# Patient Record
Sex: Female | Born: 1955
Health system: Southern US, Community
[De-identification: ages and names within clinical notes are randomized; demographics above are authoritative.]

## PROBLEM LIST (undated history)

## (undated) DIAGNOSIS — M67472 Ganglion, left ankle and foot: Secondary | ICD-10-CM

## (undated) DIAGNOSIS — R1011 Right upper quadrant pain: Secondary | ICD-10-CM

## (undated) DIAGNOSIS — Z9882 Breast implant status: Secondary | ICD-10-CM

## (undated) DIAGNOSIS — N898 Other specified noninflammatory disorders of vagina: Secondary | ICD-10-CM

## (undated) DIAGNOSIS — B379 Candidiasis, unspecified: Secondary | ICD-10-CM

## (undated) DIAGNOSIS — I10 Essential (primary) hypertension: Secondary | ICD-10-CM

## (undated) DIAGNOSIS — E7800 Pure hypercholesterolemia, unspecified: Secondary | ICD-10-CM

## (undated) DIAGNOSIS — F99 Mental disorder, not otherwise specified: Secondary | ICD-10-CM

## (undated) DIAGNOSIS — R319 Hematuria, unspecified: Secondary | ICD-10-CM

## (undated) DIAGNOSIS — G43109 Migraine with aura, not intractable, without status migrainosus: Secondary | ICD-10-CM

## (undated) DIAGNOSIS — K219 Gastro-esophageal reflux disease without esophagitis: Secondary | ICD-10-CM

## (undated) DIAGNOSIS — M810 Age-related osteoporosis without current pathological fracture: Principal | ICD-10-CM

## (undated) DIAGNOSIS — R8789 Other abnormal findings in specimens from female genital organs: Secondary | ICD-10-CM

## (undated) DIAGNOSIS — I471 Supraventricular tachycardia, unspecified: Secondary | ICD-10-CM

## (undated) DIAGNOSIS — M549 Dorsalgia, unspecified: Secondary | ICD-10-CM

## (undated) DIAGNOSIS — N951 Menopausal and female climacteric states: Secondary | ICD-10-CM

## (undated) DIAGNOSIS — R232 Flushing: Secondary | ICD-10-CM

## (undated) DIAGNOSIS — IMO0002 Reserved for concepts with insufficient information to code with codable children: Secondary | ICD-10-CM

## (undated) DIAGNOSIS — M489 Spondylopathy, unspecified: Secondary | ICD-10-CM

## (undated) DIAGNOSIS — N816 Rectocele: Secondary | ICD-10-CM

## (undated) DIAGNOSIS — R87629 Unspecified abnormal cytological findings in specimens from vagina: Secondary | ICD-10-CM

## (undated) DIAGNOSIS — R87619 Unspecified abnormal cytological findings in specimens from cervix uteri: Secondary | ICD-10-CM

## (undated) DIAGNOSIS — F419 Anxiety disorder, unspecified: Secondary | ICD-10-CM

## (undated) HISTORY — DX: Candidiasis, unspecified: B37.9

## (undated) HISTORY — DX: Pure hypercholesterolemia, unspecified: E78.00

## (undated) HISTORY — DX: Unspecified abnormal cytological findings in specimens from cervix uteri: R87.619

## (undated) HISTORY — DX: Migraine with aura, not intractable, without status migrainosus: G43.109

## (undated) HISTORY — DX: Rectocele: N81.6

## (undated) HISTORY — PX: OTHER SURGICAL HISTORY: SHX169

## (undated) HISTORY — DX: Spondylopathy, unspecified: M48.9

## (undated) HISTORY — DX: Breast implant status: Z98.82

## (undated) HISTORY — DX: Dorsalgia, unspecified: M54.9

## (undated) HISTORY — DX: Supraventricular tachycardia: I47.1

## (undated) HISTORY — DX: Other specified noninflammatory disorders of vagina: N89.8

## (undated) HISTORY — DX: Flushing: R23.2

## (undated) HISTORY — PX: BREAST ENHANCEMENT SURGERY: SHX7

## (undated) HISTORY — DX: Ganglion, left ankle and foot: M67.472

## (undated) HISTORY — DX: Hematuria, unspecified: R31.9

## (undated) HISTORY — DX: Reserved for concepts with insufficient information to code with codable children: IMO0002

## (undated) HISTORY — DX: Supraventricular tachycardia, unspecified: I47.10

## (undated) HISTORY — DX: Mental disorder, not otherwise specified: F99

## (undated) HISTORY — DX: Anxiety disorder, unspecified: F41.9

## (undated) HISTORY — DX: Other abnormal findings in specimens from female genital organs: R87.89

## (undated) HISTORY — DX: Right upper quadrant pain: R10.11

## (undated) HISTORY — DX: Age-related osteoporosis without current pathological fracture: M81.0

## (undated) HISTORY — DX: Menopausal and female climacteric states: N95.1

## (undated) HISTORY — DX: Unspecified abnormal cytological findings in specimens from vagina: R87.629

---

## 2001-05-24 ENCOUNTER — Other Ambulatory Visit: Admission: RE | Admit: 2001-05-24 | Discharge: 2001-05-24 | Payer: Self-pay | Admitting: Obstetrics and Gynecology

## 2004-09-19 ENCOUNTER — Encounter: Admission: RE | Admit: 2004-09-19 | Discharge: 2004-09-19 | Payer: Self-pay | Admitting: Obstetrics and Gynecology

## 2005-01-07 ENCOUNTER — Encounter: Admission: RE | Admit: 2005-01-07 | Discharge: 2005-01-07 | Payer: Self-pay | Admitting: Obstetrics and Gynecology

## 2005-01-14 ENCOUNTER — Encounter: Admission: RE | Admit: 2005-01-14 | Discharge: 2005-01-14 | Payer: Self-pay | Admitting: Obstetrics and Gynecology

## 2005-02-04 ENCOUNTER — Encounter: Admission: RE | Admit: 2005-02-04 | Discharge: 2005-02-04 | Payer: Self-pay | Admitting: Obstetrics and Gynecology

## 2005-03-06 ENCOUNTER — Encounter: Admission: RE | Admit: 2005-03-06 | Discharge: 2005-03-06 | Payer: Self-pay | Admitting: Obstetrics and Gynecology

## 2005-03-13 ENCOUNTER — Encounter: Admission: RE | Admit: 2005-03-13 | Discharge: 2005-03-13 | Payer: Self-pay | Admitting: Interventional Radiology

## 2005-04-08 ENCOUNTER — Encounter: Admission: RE | Admit: 2005-04-08 | Discharge: 2005-04-08 | Payer: Self-pay | Admitting: Obstetrics and Gynecology

## 2005-04-24 ENCOUNTER — Encounter: Admission: RE | Admit: 2005-04-24 | Discharge: 2005-04-24 | Payer: Self-pay | Admitting: Obstetrics and Gynecology

## 2005-05-08 ENCOUNTER — Encounter: Admission: RE | Admit: 2005-05-08 | Discharge: 2005-05-08 | Payer: Self-pay | Admitting: Interventional Radiology

## 2005-07-22 ENCOUNTER — Encounter: Admission: RE | Admit: 2005-07-22 | Discharge: 2005-07-22 | Payer: Self-pay | Admitting: Obstetrics and Gynecology

## 2007-09-21 ENCOUNTER — Ambulatory Visit: Payer: Self-pay | Admitting: Cardiology

## 2007-09-25 ENCOUNTER — Ambulatory Visit: Payer: Self-pay | Admitting: Cardiology

## 2007-09-29 ENCOUNTER — Ambulatory Visit: Payer: Self-pay | Admitting: Cardiology

## 2007-10-26 ENCOUNTER — Ambulatory Visit: Payer: Self-pay | Admitting: Cardiology

## 2008-05-10 ENCOUNTER — Other Ambulatory Visit: Admission: RE | Admit: 2008-05-10 | Discharge: 2008-05-10 | Payer: Self-pay | Admitting: Obstetrics and Gynecology

## 2008-10-24 ENCOUNTER — Ambulatory Visit: Payer: Self-pay | Admitting: Cardiology

## 2009-02-22 ENCOUNTER — Encounter (INDEPENDENT_AMBULATORY_CARE_PROVIDER_SITE_OTHER): Payer: Self-pay | Admitting: Orthopedic Surgery

## 2009-02-22 ENCOUNTER — Ambulatory Visit (HOSPITAL_BASED_OUTPATIENT_CLINIC_OR_DEPARTMENT_OTHER): Admission: RE | Admit: 2009-02-22 | Discharge: 2009-02-22 | Payer: Self-pay | Admitting: Orthopedic Surgery

## 2009-05-21 ENCOUNTER — Encounter (INDEPENDENT_AMBULATORY_CARE_PROVIDER_SITE_OTHER): Payer: Self-pay | Admitting: *Deleted

## 2009-05-21 ENCOUNTER — Other Ambulatory Visit: Admission: RE | Admit: 2009-05-21 | Discharge: 2009-05-21 | Payer: Self-pay | Admitting: Obstetrics and Gynecology

## 2009-05-21 LAB — CONVERTED CEMR LAB
ALT: 15 units/L
AST: 17 units/L
Albumin: 4.3 g/dL
Alkaline Phosphatase: 65 units/L
BUN: 14 mg/dL
CO2: 24 meq/L
Calcium: 9.5 mg/dL
Chloride: 103 meq/L
Cholesterol: 202 mg/dL
Creatinine, Ser: 0.86 mg/dL
Glucose, Bld: 88 mg/dL
HDL: 58 mg/dL
Hgb A1c MFr Bld: 5.8 %
LDL Cholesterol: 124 mg/dL
Potassium: 4.7 meq/L
Sodium: 140 meq/L
TSH: 0.604 microintl units/mL
Total Protein: 6.9 g/dL
Triglycerides: 101 mg/dL

## 2009-08-09 ENCOUNTER — Telehealth (INDEPENDENT_AMBULATORY_CARE_PROVIDER_SITE_OTHER): Payer: Self-pay | Admitting: *Deleted

## 2009-08-14 ENCOUNTER — Ambulatory Visit: Payer: Self-pay | Admitting: Cardiology

## 2009-08-14 ENCOUNTER — Encounter (INDEPENDENT_AMBULATORY_CARE_PROVIDER_SITE_OTHER): Payer: Self-pay | Admitting: *Deleted

## 2009-08-14 DIAGNOSIS — R29818 Other symptoms and signs involving the nervous system: Secondary | ICD-10-CM | POA: Insufficient documentation

## 2009-08-15 ENCOUNTER — Encounter: Payer: Self-pay | Admitting: Adult Health

## 2009-08-20 ENCOUNTER — Ambulatory Visit: Payer: Self-pay | Admitting: Cardiology

## 2009-08-20 ENCOUNTER — Ambulatory Visit (HOSPITAL_COMMUNITY): Admission: RE | Admit: 2009-08-20 | Discharge: 2009-08-20 | Payer: Self-pay | Admitting: Cardiology

## 2009-08-20 ENCOUNTER — Encounter: Payer: Self-pay | Admitting: Cardiology

## 2009-09-17 ENCOUNTER — Encounter (INDEPENDENT_AMBULATORY_CARE_PROVIDER_SITE_OTHER): Payer: Self-pay | Admitting: *Deleted

## 2009-09-20 ENCOUNTER — Encounter (INDEPENDENT_AMBULATORY_CARE_PROVIDER_SITE_OTHER): Payer: Self-pay | Admitting: *Deleted

## 2009-09-20 ENCOUNTER — Ambulatory Visit: Payer: Self-pay | Admitting: Cardiology

## 2010-09-20 ENCOUNTER — Encounter: Payer: Self-pay | Admitting: Cardiology

## 2010-09-20 ENCOUNTER — Ambulatory Visit
Admission: RE | Admit: 2010-09-20 | Discharge: 2010-09-20 | Payer: Self-pay | Source: Home / Self Care | Attending: Cardiology | Admitting: Cardiology

## 2010-09-20 LAB — CONVERTED CEMR LAB
AST: 24 units/L (ref 0–37)
Albumin: 4.5 g/dL (ref 3.5–5.2)
Alkaline Phosphatase: 76 units/L (ref 39–117)
BUN: 13 mg/dL (ref 6–23)
Basophils Absolute: 0.1 10*3/uL (ref 0.0–0.1)
Basophils Relative: 1 % (ref 0–1)
CO2: 30 meq/L (ref 19–32)
Calcium: 10 mg/dL (ref 8.4–10.5)
Chloride: 100 meq/L (ref 96–112)
Cholesterol: 169 mg/dL (ref 0–200)
Creatinine, Ser: 0.62 mg/dL (ref 0.40–1.20)
Eosinophils Relative: 2 % (ref 0–5)
Glucose, Bld: 99 mg/dL (ref 70–99)
HDL: 65 mg/dL (ref >39)
Hemoglobin: 14.9 g/dL (ref 12.0–15.0)
LDL Cholesterol: 89 mg/dL (ref 0–99)
Lymphocytes Relative: 48 % — ABNORMAL HIGH (ref 12–46)
Lymphs Abs: 2.2 10*3/uL (ref 0.7–4.0)
MCHC: 33 g/dL (ref 30.0–36.0)
MCV: 98.7 fL (ref 78.0–100.0)
Monocytes Absolute: 0.4 10*3/uL (ref 0.1–1.0)
Monocytes Relative: 8 % (ref 3–12)
Neutro Abs: 1.9 10*3/uL (ref 1.7–7.7)
Neutrophils Relative %: 42 % — ABNORMAL LOW (ref 43–77)
Platelets: 260 10*3/uL (ref 150–400)
Potassium: 3.9 meq/L (ref 3.5–5.3)
RBC: 4.57 M/uL (ref 3.87–5.11)
RDW: 11.9 % (ref 11.5–15.5)
Sodium: 139 meq/L (ref 135–145)
TSH: 1.093 microintl units/mL (ref 0.350–4.500)
Total CHOL/HDL Ratio: 2.6
Triglycerides: 73 mg/dL (ref <150)
VLDL: 15 mg/dL (ref 0–40)
WBC: 4.6 10*3/uL (ref 4.0–10.5)

## 2010-09-22 ENCOUNTER — Encounter: Payer: Self-pay | Admitting: Interventional Radiology

## 2010-09-22 ENCOUNTER — Encounter: Payer: Self-pay | Admitting: Obstetrics and Gynecology

## 2010-09-23 ENCOUNTER — Encounter (INDEPENDENT_AMBULATORY_CARE_PROVIDER_SITE_OTHER): Payer: Self-pay | Admitting: *Deleted

## 2010-10-01 NOTE — Letter (Signed)
Summary: [place] Results Engineer, agricultural at [facility]  [address], [place] [address]   Phone: [phone]  Fax: [phone]      September 20, 2009 MRN: [id number]   [facility] [address] [place], [place]  [address]   Dear Ms. Cowman,  Your test ordered by Selena Batten has been reviewed by your physician (or physician assistant) and was found to be normal or stable. Your physician (or physician assistant) felt no changes were needed at this time.  __x__ Echocardiogram  ____ Cardiac Stress Test  ____ Lab Work  ____ Peripheral vascular study of arms, legs or neck  ____ CT scan or X-ray  ____ Lung or Breathing test  ____ Other:  No change in medical treatment at this time, per Dr. Dietrich Pates.  Thank you, Christian Borgerding Allyne Gee RN    [place] Bing, MD, Lenise Arena.C.Gaylord Shih, MD, F.A.C.C Lewayne Bunting, MD, F.A.C.C Nona Dell, MD, F.A.C.C Charlton Haws, MD, Lenise Arena.C.C

## 2010-10-01 NOTE — Assessment & Plan Note (Signed)
Summary: 1 MTH F/U PER CHECKOUT ON 08/14/09/TG      Allergies Added: NKDA  Visit Type:  Follow-up Primary Provider:  Dr,Hasanji   History of Present Illness: Return visit for this very pleasant 55 year old woman with a history of PSVT and chest discomfort.  Since her last visit, she has done generally well.  Her shoulder discomfort has improved, and she apparently was found to have some degenerative joint disease of the cervical spine.  Nonetheless, she is exercising on a daily basis.  She has occasional brief palpitations, but has not had sustained tachycardia for quite some time.  She maintains a high level of activity with no dyspnea and no exercise-induced chest discomfort.  She has had no lightheadedness and no syncope.  Current Medications (verified): 1)  Fish Oil 300 Mg Caps (Omega-3 Fatty Acids) .... Take 1 Cap Daily 2)  Vitamin B-12 100 Mcg Tabs (Cyanocobalamin) .... Take 1 Tab Daily 3)  Folic Acid 1 Mg Tabs (Folic Acid) .... Take 1 Tab Bids 4)  Tylenol Extra Strength 500 Mg Tabs (Acetaminophen) .... As Needed For Pain  Allergies (verified): No Known Drug Allergies  Past History:  Past Medical History: Paroxysmal SVT-documented by event recorder  Anxiety  Past Surgical History: Augmentation mammoplasty  Family History: Father:alive and well Mother:deceased due to cancer Siblings: One brother and one sister are alive and well.  Social History: Widowed; 3 adult children  Tobacco Use - No.  Alcohol Use - no Regular Exercise - yes Drug Use - no  Review of Systems       see history of present illness  Vital Signs:  Patient profile:   55 year old female Weight:      123 pounds BMI:     19.92 Pulse rate:   72 / minute BP sitting:   134 / 66  (right arm)  Vitals Entered By: Dreama Saa, CNA (September 20, 2009 2:29 PM)  Physical Exam  General:   General-Thin; no acute distress:    Neck-No JVD, no carotid bruits: Lungs-No tachypnea, no rales; no rhonchi;no  wheezes; straight back Cardiovascular-normal PMI; normal S1 and S2: Abdomen-BS normal; soft and non-tender without masses or organomegaly:  Skin-Warm, no significant lesions: Extremities-Nl distal pulses; no edema:    Impression & Recommendations:  Problem # 1:  SUPRAVENTRICULAR TACHYCARDIA, PAROXYSMAL, HX OF (ICD-V12.59) It is uncertain whether there has been a spontaneous remission of her PSVT or whether she continues to have spells without significant symptoms.  In any case, treatment need only be utilized on a symptomatic basis.  She has a prescription for beta blocker she requires it.  Otherwise, I will see her again  in one year.  Problem # 2:  MUSCULOSKELETAL PAIN (ICD-781.99) Stress echocardiogram was negative. Musculoskeletal symptoms are improved.  She can use nonsteroidals and analgesics as needed.  Patient Instructions: 1)  Your physician recommends that you schedule a follow-up appointment in: 1 year 2)  Your physician recommends that you continue on your current medications as directed. Please refer to the Current Medication list given to you today.

## 2010-10-01 NOTE — Miscellaneous (Signed)
Summary: labs cmp,lipid tsh 05/21/2009  Clinical Lists Changes  Observations: Added new observation of CALCIUM: 9.5 mg/dL (60/45/4098 11:91) Added new observation of ALBUMIN: 4.3 g/dL (47/82/9562 13:08) Added new observation of PROTEIN, TOT: 6.9 g/dL (65/78/4696 29:52) Added new observation of SGPT (ALT): 15 units/L (05/21/2009 15:37) Added new observation of SGOT (AST): 17 units/L (05/21/2009 15:37) Added new observation of ALK PHOS: 65 units/L (05/21/2009 15:37) Added new observation of CREATININE: 0.86 mg/dL (84/13/2440 10:27) Added new observation of BUN: 14 mg/dL (25/36/6440 34:74) Added new observation of BG RANDOM: 88 mg/dL (25/95/6387 56:43) Added new observation of CO2 PLSM/SER: 24 meq/L (05/21/2009 15:37) Added new observation of CL SERUM: 103 meq/L (05/21/2009 15:37) Added new observation of K SERUM: 4.7 meq/L (05/21/2009 15:37) Added new observation of NA: 140 meq/L (05/21/2009 15:37) Added new observation of LDL: 124 mg/dL (32/95/1884 16:60) Added new observation of HDL: 58 mg/dL (63/09/6008 93:23) Added new observation of TRIGLYC TOT: 101 mg/dL (55/73/2202 54:27) Added new observation of CHOLESTEROL: 202 mg/dL (02/22/7627 31:51) Added new observation of TSH: 0.604 microintl units/mL (05/21/2009 15:37) Added new observation of HGBA1C: 5.8 % (05/21/2009 15:37)

## 2010-10-03 NOTE — Letter (Signed)
Summary: [place] Results Engineer, agricultural at [facility]  [address] [place] [address]   Phone: [phone]  Fax: [phone]      September 23, 2010 MRN: [id number]   [facility] April Knox [address] [place], [place]  [address]   Dear April Knox,  Your test ordered by Selena Batten has been reviewed by your physician (or physician assistant) and was found to be normal or stable. Your physician (or physician assistant) felt no changes were needed at this time.  ____ Echocardiogram  ____ Cardiac Stress Test  __x__ Lab Work  ____ Peripheral vascular study of arms, legs or neck  ____ CT scan or X-ray  ____ Lung or Breathing test  ____ Other: No change in medical treatment at this time, per Dr. Dietrich Pates.  Enclosed is a copy of your labwork for your records.   Thank you, [name] Allyne Gee RN    [place] Bing, MD, Lenise Arena.C.Gaylord Shih, MD, F.A.C.C Lewayne Bunting, MD, F.A.C.C Nona Dell, MD, F.A.C.C Charlton Haws, MD, Lenise Arena.C.C

## 2010-10-03 NOTE — Assessment & Plan Note (Signed)
Summary: 1 yr f/u per checkout on 09/20/09/tg  Medications Added DAILY-VITAMIN  TABS (MULTIPLE VITAMIN) take 1 tab daily      Allergies Added: NKDA  Visit Type:  Follow-up Primary Provider:  Dr,Hasanji   History of Present Illness: Ms. Zurri Rudden returns to the office as scheduled for continued assessment and treatment of atrial or ventricular ectopy and PSVT.  Since her last visit, she has done quite well, noting only occasional and brief palpitations.  She has had no chest discomfort, no lightheadedness, no syncope and no dyspnea.  Cervical spine symptoms improved with conservative measures.  She has had no new medical issues.  She's been less active due to the need to care for an elderly parent.   Current Medications (verified): 1)  Vitamin B-12 100 Mcg Tabs (Cyanocobalamin) .... Take 1 Tab Daily 2)  Folic Acid 1 Mg Tabs (Folic Acid) .... Take 1 Tab Bids 3)  Tylenol Extra Strength 500 Mg Tabs (Acetaminophen) .... As Needed For Pain 4)  Daily-Vitamin  Tabs (Multiple Vitamin) .... Take 1 Tab Daily  Allergies (verified): No Known Drug Allergies  Comments:  Nurse/Medical Assistant: patient uses [facility] didnt bring meds or list   Past History:  PMH, FH, and Social History reviewed and updated.  Past Medical History: Paroxysmal SVT-documented by event recorder  Anxiety Cervical spine disease-mild  Family History: Father:alive and well; dementia at age 26 Mother:deceased due to cancer-unknown primary metastatic to bone Siblings: One brother and one sister are alive and well.  Review of Systems       See history of present illness.  Vital Signs:  Patient profile:   55 year old female Weight:      119 pounds BMI:     19.28 O2 Sat:      98 % on Room air Pulse rate:   75 / minute BP sitting:   136 / 91  (left arm)  Vitals Entered By: Dreama Saa, CNA (September 20, 2010 11:39 AM)  O2 Flow:  Room air  Physical Exam  General:  Thin; no acute  distress:  Blood pressure-repeat with manual cuff: 130/80 Weight-119 4 pounds less than last year Neck-No JVD, no carotid bruits: Lungs-No tachypnea, no rales; no rhonchi; no wheezes; exaggerated lumbar lordosis Cardiovascular-normal PMI; frequent prematures; normal S1 and S2: Abdomen-BS normal; soft and non-tender without masses or organomegaly:  Skin-Warm, no significant lesions: Extremities-Nl distal pulses; no edema:    Impression & Recommendations:  Problem # 1:  SUPRAVENTRICULAR TACHYCARDIA, PAROXYSMAL, HX OF (ICD-V12.59) Patient has frequent ectopy based upon her exam.  This was noted on event recording performed 2 years ago.  She prefers to avoid pharmacologic therapy if possible.  As long as she does not have sustained tachyarrhythmias, I believe that is a safe and reasonable course. Basic laboratory studies including a TSH, CBC and chemistry profile will be performed as well as a lipid profile.  She is encouraged to increase regular physical exercise and to avoid further weight loss.  I will see this nice woman again in one year.  Other Orders: T-Comprehensive Metabolic Panel [phone]) T-CBC w/Diff [phone]) T-TSH [phone]) T-Lipid Profile [phone])  Patient Instructions: 1)  Your physician recommends that you schedule a follow-up appointment in: 1 YEAR OR FOR INCREASED PALPITATIONS 2)  Your physician recommends that you return for lab work in: TODAY 3)  Your physician discussed the importance of regular exercise and recommended that you start or continue a regular exercise program for good health. 4)  DASH DIET

## 2010-12-09 LAB — POCT HEMOGLOBIN-HEMACUE: Hemoglobin: 15.2 g/dL — ABNORMAL HIGH (ref 12.0–15.0)

## 2011-01-14 NOTE — Op Note (Signed)
NAME:  Knox, April              ACCOUNT NO.:  [id number]   MEDICAL RECORD NO.:  [id number]          PATIENT TYPE:  AMB   LOCATION:  [facility]                          FACILITY:  [facility]   PHYSICIAN:  Cindee Salt, M.D.       DATE OF BIRTH:  [birth year]   DATE OF PROCEDURE:  02/22/2009  DATE OF DISCHARGE:                               OPERATIVE REPORT   PREOPERATIVE DIAGNOSIS:  Mucoid tumor, right thumb.   POSTOPERATIVE DIAGNOSIS:  Mucoid tumor, right thumb.   OPERATION:  Excision of mucoid cyst and debridement of interphalangeal  joint, right thumb.   SURGEON:  Cindee Salt, MD   ASSISTANT:  Carolyne Fiscal, RN   ANESTHESIA:  Forearm-based IV regional with local infiltration and  metacarpal block using Marcaine.   ANESTHESIOLOGIST:  Quita Skye. Krista Blue, MD.   HISTORY:  The patient is a 55 year old female with a history of a large  mass over the IP joint of her right thumb.  X-rays revealed degenerative  changes in the IP joint.  She has elected to undergo surgical excision.  Pre, peri, and postoperative course have been discussed along with risks  and complications.  She is aware that there is no guarantee with  surgery, possibility of infection, recurrence injury to arteries,  nerves, tendons, incomplete relief of symptoms, and dystrophy.  In the  preoperative area, the patient is seen.  The extremity is marked by both  the patient and surgeon.  Antibiotic is given.   PROCEDURE:  The patient was brought to the operating room where a  forearm-based IV regional anesthetic was carried out without difficulty.  She was prepped using ChloraPrep, supine position, right arm free.  A 3-  minute dry time was allowed.  A time-out was taken confirming the  patient and procedure.  She was then draped.  The incision was made  curvilinear over the dorsal aspect of the proximal phalanx IP joint of  her right thumb and carried down through the subcutaneous tissue.  Bleeders were electrocauterized with bipolar.   Dissection was carried  down to the cyst with blunt and sharp dissection and this was dissected  free and sent to pathology.  The joint was opened and large osteophyte  was present over the proximal phalanx.  This was removed with a small  rongeur.  No further lesions were identified.  The wound was copiously  irrigated with saline.  The skin was closed interrupted 5-0 Vicryl  Rapide sutures.  A sterile compressive dressing and splint to the finger  was applied.  The patient tolerated the procedure well and was taken to  the recovery room for observation in satisfactory condition.  Prior to  placement of the dressing, a metacarpal block was given with 0.25%  Marcaine without epinephrine, 5 mL was used.  The patient will be  discharged home to return to the [facility] in 1 week on  Vicodin.          ______________________________  Cindee Salt, M.D.    GK/MEDQ  D:  02/22/2009  T:  02/23/2009  Job:  [id number]

## 2011-01-14 NOTE — Letter (Signed)
September 21, 2007    Xaje Hasanaj  [address]  [place],  [place] [address]   RE:  SENNA, LAPE  MRN:  [id number]  /  DOB:  04-29-1956   PRIMARY CARE PHYSICIAN:  Dr. Despina Hidden   Dear Dr. Olena Leatherwood:   It was my pleasure evaluating Ms. Pernice, one of your patients who was  referred by Dr. Despina Hidden for cardiology consultation.  As you know, this  nice woman carries a diagnosis of mitral valve prolapse from a remote  echocardiogram.  She has had no symptoms nor complications referable to  this.  She also has had cardiac irregularities.  She reports episodes of  tachycardia that resolved spontaneously.  The only symptom is  palpitations.  She finds these mildly aversive.  It was recommended that  she take metoprolol, but she was concerned about the side effects and  has not started that medication.  She was seen in Dr. Forestine Chute office and  noticed then noted to have frequent PVCs prompting this referral.   Ms. Steines enjoys generally excellent health.  She has no known  vascular disease.  She has not been seen by a cardiologist for many  years.  She has not been hospitalized except for childbirth and cosmetic  surgery.  SHE REPORTS AN ALLERGY TO PENICILLIN AND CODEINE.  Her only  current medications include oral contraceptives and doxycycline.   SOCIAL HISTORY:  Unemployed; relatively active lifestyle including  raising three children, who are now adults.  She was widowed a few years  ago and is now experiencing stress regarding the breakup of a long-term  relationship.   FAMILY HISTORY:  Mother died with neoplastic disease.  Father is alive  and well.  Two siblings are alive and well.   REVIEW OF SYSTEMS:  Is generally negative.   PHYSICAL EXAMINATION:  Thin woman appearing younger than her stated age.  Her weight is 117 pounds, blood pressure initially 140/90 subsequently  125/80 in the left arm sitting, heart rate 70 and regular, respirations  18.  NECK:  No jugular venous distention;  normal carotid upstrokes without  bruits.  HEENT:  EOMs full; normal lids and conjunctivae; normal oral mucosa.  ENDOCRINE:  No thyromegaly.  HEMATOPOIETIC:  No adenopathy.  LUNGS:  Clear.  Straight back; mild pectus deformity.  CARDIAC:  Normal first and second heart sounds.  She does have a low  pitched systolic sound, probably a click.  There is no significant  murmur.  PMI is normal.  No heaves nor lifts.  ABDOMEN:  Scaphoid; soft and nontender; no organomegaly.  EXTREMITIES:  No edema; normal distal pulses.  NEUROMUSCULAR:  Symmetric strength and tone; normal cranial nerves.   EKG:  Normal sinus rhythm; left atrial abnormality; T-wave inversion in  lead V2 consistent with persistent juvenile pattern; prominent voltage;  frequent PVCs; delayed R-wave progression; nondiagnostic inferior Q-  waves.   IMPRESSION:  Ms. Mick has PVCs.  She has symptoms consistent with  supraventricular tachycardia, but this is far from proven.  Her physical  findings do support a previous diagnosis of MVP, although this diagnosis  was certainly over used at the time she originally underwent  echocardiography.  We will repeat an echocardiogram.  She will be  provided with event recorder for 3 weeks.  She will refrain from taking  metoprolol for the time being.  I will plan see this nice woman again  after she completes 3 weeks of event recording.  Has no other risk  factors for thromboembolism.  I also asked her to determine who has been  treating her with doxycycline over the long-term and to reevaluate  whether continued use of that medicine on a daily basis is necessary.  I  will plan to see this nice woman again after she completes event  recording.    Sincerely,      Gerrit Friends. Dietrich Pates, MD, [facility]  Electronically Signed    RMR/MedQ  DD: 09/21/2007  DT: 09/21/2007  Job #: [id number]   CC:    Lia Hopping

## 2011-01-14 NOTE — Letter (Signed)
October 26, 2007    April Knox  [address]  [place],  [place] [address]   RE:  April, RACZKOWSKI  MRN:  [id number]  /  DOB:  May 20, 1956   Dear Dr. Olena Leatherwood:   April Knox returns to the office for continued assessment and treatment  of mitral valve prolapse and paroxysmal supraventricular tachycardia.  Both of these entities were verified on the testing that we performed.  We asked her to start metoprolol, but she misunderstood those  directions.  When her monitor continued to show supraventricular  tachycardia, we asked her to add Diltiazem.  At that point, she started  taking metoprolol.  She now comes in on metoprolol 25 mg daily and  Diltiazem 240 mg daily in addition to her usual medications.  Her  symptoms are totally suppressed.   On exam, pleasant, healthy-appearing woman.  The weight is 120 pounds, 3  pounds more than at her last visit.  Blood pressure 120/75, heart rate  70 and regular, respirations 18.  NECK:  No jugular venous distention.  LUNGS:  Clear.  CARDIAC:  Normal first and second heart sounds; systolic click present.  EXTREMITIES:  No edema.   Echocardiogram showed delicate mitral valve leaflets with mild prolapse  and mild regurgitation.  Frequent episodes of nonsustained  supraventricular tachycardia were present on her event recorder with  rates as high as 193 beats per minute.   IMPRESSION:  April Knox has mitral valve prolapse.  With the morphology  of her valve and mild regurgitation, this will almost certainly never be  a significant medical issue for her.  She has nonsustained  supraventricular tachycardia.  Symptoms are suppressed with Diltiazem  and a  very low dose of metoprolol.  We will stop metoprolol to see if she  really needs two drugs.  We discussed radiofrequency ablation, but she  is not inclined to consider that procedure at present.  I will reassess  this nice woman again in 1 year or should she once again become  symptomatic.   Thank so much for allowing her see me.    Sincerely,      Gerrit Friends. Dietrich Pates, MD, [facility]  Electronically Signed    RMR/MedQ  DD: 10/26/2007  DT: 10/26/2007  Job #: [id number]

## 2011-01-14 NOTE — Letter (Signed)
October 24, 2008    Lia Hopping, MD  [address]  [place], [place] [address]   RE:  April Knox, April Knox  MRN:  [id number]  /  DOB:  [birth year]   Dear Dr. Olena Leatherwood:   April Knox returns to the office as scheduled for continued assessment  and treatment of PSVT.  Since her last visit, she discontinued both  metoprolol and diltiazem.  She was fearful of taking those medications  continuously.  Fortunately, she has had little in the way of recurrent  symptoms.  Although when she carried an event recorder, she identified  multiple episodes per week of SVT, in recent months, she has suffered  only a rare spell.  These resolved with rest.  She has had no other  cardiopulmonary problems.  Her only other medical problem in the past  year has been an episode of nausea and abdominal discomfort for which  she was evaluated in the emergency department and from which she  eventually recovered.  She was started on ranitidine 150 mg daily during  that spell and continues to take this medication.  Otherwise, she  consumes a number of nutraceuticals.   PHYSICAL EXAMINATION:  GENERAL:  On exam, well-developed trim woman in  no acute distress.  VITAL SIGNS:  The weight is 122, 2 pounds more than in February of last  year.  Blood pressure 120/70, heart rate 70 and regular, respirations 14  and unlabored.  NECK:  No jugular venous distention; no carotid bruits.  LUNGS:  Straight back; clear lung fields.  CARDIAC:  Normal first and second heart sounds.  ABDOMEN:  Soft and nontender; no bruits.  EXTREMITIES:  No edema.   IMPRESSION:  April Knox is doing well with a spontaneous remission of  paroxysmal supraventricular tachycardia.  In all likelihood, she will  suffer a recurrence at some point.  I  advised her that she can start the medication she already has or call  the office if this occurs.  Otherwise, she is fine off medication.  She  is unlikely to experience any significant adverse  events related to her  arrhythmia.  I will be happy to see this nice woman again at any time I  can assist with her care.    Sincerely,      Gerrit Friends. Dietrich Pates, MD, [facility]  Electronically Signed    RMR/MedQ  DD: 10/24/2008  DT: 10/25/2008  Job #: [id number]

## 2011-06-25 ENCOUNTER — Encounter: Payer: Self-pay | Admitting: Cardiology

## 2011-06-26 ENCOUNTER — Encounter: Payer: Self-pay | Admitting: Cardiology

## 2011-06-26 ENCOUNTER — Ambulatory Visit (INDEPENDENT_AMBULATORY_CARE_PROVIDER_SITE_OTHER): Payer: Medicare Other | Admitting: Cardiology

## 2011-06-26 ENCOUNTER — Ambulatory Visit: Payer: Self-pay | Admitting: Cardiology

## 2011-06-26 VITALS — BP 126/82 | HR 64 | Ht 66.0 in | Wt 121.0 lb

## 2011-06-26 DIAGNOSIS — Z8679 Personal history of other diseases of the circulatory system: Secondary | ICD-10-CM

## 2011-06-26 DIAGNOSIS — I498 Other specified cardiac arrhythmias: Secondary | ICD-10-CM

## 2011-06-26 DIAGNOSIS — R29818 Other symptoms and signs involving the nervous system: Secondary | ICD-10-CM

## 2011-06-26 DIAGNOSIS — F419 Anxiety disorder, unspecified: Secondary | ICD-10-CM

## 2011-06-26 DIAGNOSIS — I471 Supraventricular tachycardia: Secondary | ICD-10-CM

## 2011-06-26 MED ORDER — ALPRAZOLAM ER 0.5 MG PO TB24: ORAL_TABLET | ORAL | Status: DC

## 2011-06-26 NOTE — Assessment & Plan Note (Signed)
Patient's symptoms are consistent with anxiety in the setting of social stress.  Fortunately, her sister is looking for a new place to live.  Until then, I prescribed Xanax 0.25-1 mg up to 3 times a day as needed.  Patient will call for new or increased symptoms or contact her PCP.  I will plan to reevaluate April Knox as previously planned in approximately 2 months.

## 2011-06-26 NOTE — Patient Instructions (Signed)
Your physician recommends that you schedule a follow-up appointment in: 1 month  Your physician has recommended you make the following change in your medication: Xanax 0.5mg  1/2 to 2 tabs at bedtime and twice daily as needed.

## 2011-06-26 NOTE — Progress Notes (Signed)
HPI : April Knox returns to the office for a requested visit for evaluation of anxiety associated with chest and abdominal discomfort.  Patient has recently been living with her sister and her sister's 3 children creating substantial disruption and stress in her life.  She has been sleeping poorly with multiple middle of the night awakenings.  She describes racing thoughts, subjective anxiety and vague discomfort in her chest and abdomen.  Appetite has been stable.  She denies depressive affect.  She maintains a fairly good level of physical activity.  Current Medications:   Vitamin B12 100 mcg per day Folic acid 1 mg twice a day Tylenol as needed Multivitamin    Allergies  Allergen Reactions  . Penicillins       Past medical history, social history, and family history reviewed and updated.  ROS: Denies dyspnea, orthopnea, pedal edema, significant weight gain, palpitations, lightheadedness or syncope.  PHYSICAL EXAM: BP 126/82  Pulse 64  Ht 5\' 6"  (1.676 m)  Wt 54.885 kg (121 lb)  BMI 19.53 kg/m2  SpO2 96%  General-Well developed; no acute distress Psychiatric-Mildly anxious demeanor, which is typical for this nice woman Body habitus-proportionate weight and height Neck-No JVD; no carotid bruits Lungs-clear lung fields; resonant to percussion Cardiovascular-normal PMI; normal S1 and S2; modest basilar systolic murmur Abdomen-normal bowel sounds; soft and non-tender without masses or organomegaly Musculoskeletal-No deformities, no cyanosis or clubbing Neurologic-Normal cranial nerves; symmetric strength and tone Skin-Warm, no significant lesions Extremities-distal pulses intact; no edema  EKG:  Normal sinus rhythm; nondiagnostic inferior Q waves; slightly delayed R-wave progression; otherwise normal.  Comparison with a previous tracing performed 09/21/07, PVCs are no longer present and the R wave progression is somewhat worse.  ASSESSMENT AND PLAN:

## 2011-06-30 ENCOUNTER — Ambulatory Visit: Payer: Self-pay | Admitting: Adult Health

## 2011-07-17 ENCOUNTER — Encounter: Payer: Self-pay | Admitting: Cardiology

## 2011-07-29 ENCOUNTER — Ambulatory Visit (INDEPENDENT_AMBULATORY_CARE_PROVIDER_SITE_OTHER): Payer: Medicare Other | Admitting: Cardiology

## 2011-07-29 ENCOUNTER — Encounter: Payer: Self-pay | Admitting: Cardiology

## 2011-07-29 VITALS — BP 134/78 | HR 59 | Ht 66.0 in | Wt 117.0 lb

## 2011-07-29 DIAGNOSIS — F411 Generalized anxiety disorder: Secondary | ICD-10-CM

## 2011-07-29 DIAGNOSIS — I471 Supraventricular tachycardia, unspecified: Secondary | ICD-10-CM

## 2011-07-29 DIAGNOSIS — F419 Anxiety disorder, unspecified: Secondary | ICD-10-CM

## 2011-07-29 DIAGNOSIS — R21 Rash and other nonspecific skin eruption: Secondary | ICD-10-CM

## 2011-07-29 MED ORDER — TRIAMCINOLONE ACETONIDE 0.1 % EX CREA: TOPICAL_CREAM | Freq: Three times a day (TID) | CUTANEOUS | Status: DC

## 2011-07-29 NOTE — Patient Instructions (Signed)
Your physician recommends that you schedule a follow-up appointment in: 1 year  Your physician has recommended you make the following change in your medication:  Triamcinolone (Kennalog) cream 0.1% to affected area three times a tay for 1 week

## 2011-07-29 NOTE — Assessment & Plan Note (Addendum)
Patient appears to be suffering a normal grief reaction less than one month following the death of her father for whom she provided total care during the past few years.  We discussed her emotions.  She is benefiting from use of Ativan, but does not appear to be taking this frequently.  I advised that she find an activity that will absorb her time and provide her with a sense of a fullfillment.  Current chest discomfort is brief, intermittent, relatively nonthreatening and very atypical.  As such, it is more likely a manifestation of her emotional stress than a significant underlying physical condition.

## 2011-07-29 NOTE — Assessment & Plan Note (Addendum)
Patient has not had symptoms nor other clinical manifestations of supraventricular tachycardia in years.  She does not appear to require pharmacologic therapy.

## 2011-07-29 NOTE — Assessment & Plan Note (Addendum)
Minor papular skin eruption exacerbated by manual manipulation.  Dr. Olena Leatherwood has prescribed oral antibiotics.  I will add a moderately potent steroid cream to use for no more than one week.

## 2011-07-29 NOTE — Progress Notes (Signed)
[name], female   DOB: December 12, 1955, 55 y.o.   MRN: [id number] HPI: April Knox returns to the office as scheduled for continued assessment and treatment of PSVT and anxiety.  She reports the recent death of her father, who has been ill with Alzheimer's for the past 5 years.  She has been quite anxious since his demise and is tearful as she describes his terminal course.  She has had sleep problems, decreased appetite and weight loss.  Her daughter and children continue to live with her, but have plans to move.  She is using benzodiazepines on an as needed basis with improvement.  She's had a papule on the skin below her right nares, which has been persistent, but which she has repeatedly attempted to drain.  Prior to Admission medications   Medication Sig Start Date End Date Taking? Authorizing Provider  ALPRAZolam (XANAX XR) 0.5 MG 24 hr tablet Take 1/2 to 2 tablets at bedtime and twice daily as needed for anxiety. 06/26/11  Yes Gerrit Friends. Cori Henningsen, MD  Multiple Vitamin (MULTIVITAMIN) tablet Take 1 tablet by mouth daily.     Yes Historical Provider, MD  naproxen sodium (ANAPROX) 220 MG tablet Take 220 mg by mouth as needed.     Yes Historical Provider, MD  triamcinolone cream (KENALOG) 0.1 % Apply topically 3 (three) times daily. Use x 1 week 07/29/11 07/28/12  Gerrit Friends. Dietrich Pates, MD    Allergies  Allergen Reactions  . Penicillins       Past medical history, social history, and family history reviewed and updated.  ROS: Denies orthopnea, PND, pedal edema, lightheadedness or syncope.  PHYSICAL EXAM: BP 134/78  Pulse 59  Ht 5\' 6"  (1.676 m)  Wt 53.071 kg (117 lb)  BMI 18.88 kg/m2  SpO2 97%; weight is down 4 pounds since prior visit 2 months ago  General-Well developed; no acute distress Body habitus-thin Neck-No JVD; no carotid bruits Lungs-clear lung fields; resonant to percussion Cardiovascular-normal PMI; normal S1 and S2; S4 present Abdomen-normal bowel sounds;  soft and non-tender without masses or organomegaly Musculoskeletal-No deformities, no cyanosis or clubbing Neurologic-Normal cranial nerves; symmetric strength and tone Skin-Warm, erythematous papules(#2) measuring just a few millimeters involving the skin immediately inferior to the nose. Extremities-distal pulses intact; no edema  EKG: Normal sinus rhythm; delayed R-wave progression; otherwise unremarkable.  Comparison with a previous tracing performed 09/21/2007, PVCs no longer present; left atrial abnormality much less impressive; delayed R-wave progression persists; QRS voltage remains prominent.  ASSESSMENT AND PLAN:  [place] Bing, MD 07/29/2011 3:55 PM

## 2011-09-03 DIAGNOSIS — M542 Cervicalgia: Secondary | ICD-10-CM | POA: Diagnosis not present

## 2011-09-03 DIAGNOSIS — IMO0001 Reserved for inherently not codable concepts without codable children: Secondary | ICD-10-CM | POA: Diagnosis not present

## 2011-09-19 DIAGNOSIS — F411 Generalized anxiety disorder: Secondary | ICD-10-CM | POA: Diagnosis not present

## 2011-09-19 DIAGNOSIS — M549 Dorsalgia, unspecified: Secondary | ICD-10-CM | POA: Diagnosis not present

## 2011-09-19 DIAGNOSIS — M542 Cervicalgia: Secondary | ICD-10-CM | POA: Diagnosis not present

## 2011-09-19 DIAGNOSIS — J209 Acute bronchitis, unspecified: Secondary | ICD-10-CM | POA: Diagnosis not present

## 2011-09-19 DIAGNOSIS — K2900 Acute gastritis without bleeding: Secondary | ICD-10-CM | POA: Diagnosis not present

## 2011-09-19 DIAGNOSIS — K219 Gastro-esophageal reflux disease without esophagitis: Secondary | ICD-10-CM | POA: Diagnosis not present

## 2011-10-02 ENCOUNTER — Telehealth: Payer: Self-pay | Admitting: Cardiology

## 2011-10-02 NOTE — Telephone Encounter (Signed)
PT HAS AN UNEXPLAINABLE FEELING IN HER CHEST AT NIGHT AND WOULD LIKE TO HAVE A ECHO DONE.

## 2011-10-03 NOTE — Telephone Encounter (Signed)
Echocardiogram will not necessarily provide useful information or help [name].  April Knox or I can see her if she would like to schedule an appointment.

## 2011-10-06 ENCOUNTER — Telehealth: Payer: Self-pay | Admitting: *Deleted

## 2011-10-06 NOTE — Telephone Encounter (Signed)
Relayed suggestion from Dr Dietrich Pates.  Pt states that she feels much better and that she feels she is just going through too much stress at this time.  Denies need for follow up appointment at this time. Advised her to contact us if she feels she needs further evaluation and we would be happy to see her.

## 2011-10-20 DIAGNOSIS — M549 Dorsalgia, unspecified: Secondary | ICD-10-CM | POA: Diagnosis not present

## 2011-10-20 DIAGNOSIS — J209 Acute bronchitis, unspecified: Secondary | ICD-10-CM | POA: Diagnosis not present

## 2011-10-20 DIAGNOSIS — Z85828 Personal history of other malignant neoplasm of skin: Secondary | ICD-10-CM | POA: Diagnosis not present

## 2011-10-20 DIAGNOSIS — K219 Gastro-esophageal reflux disease without esophagitis: Secondary | ICD-10-CM | POA: Diagnosis not present

## 2011-10-20 DIAGNOSIS — K2900 Acute gastritis without bleeding: Secondary | ICD-10-CM | POA: Diagnosis not present

## 2011-10-20 DIAGNOSIS — F411 Generalized anxiety disorder: Secondary | ICD-10-CM | POA: Diagnosis not present

## 2011-10-20 DIAGNOSIS — M542 Cervicalgia: Secondary | ICD-10-CM | POA: Diagnosis not present

## 2011-10-20 DIAGNOSIS — D485 Neoplasm of uncertain behavior of skin: Secondary | ICD-10-CM | POA: Diagnosis not present

## 2011-10-30 DIAGNOSIS — Z978 Presence of other specified devices: Secondary | ICD-10-CM | POA: Diagnosis not present

## 2011-10-30 DIAGNOSIS — Z1231 Encounter for screening mammogram for malignant neoplasm of breast: Secondary | ICD-10-CM | POA: Diagnosis not present

## 2011-11-05 DIAGNOSIS — D485 Neoplasm of uncertain behavior of skin: Secondary | ICD-10-CM | POA: Diagnosis not present

## 2011-11-21 DIAGNOSIS — M542 Cervicalgia: Secondary | ICD-10-CM | POA: Diagnosis not present

## 2011-11-21 DIAGNOSIS — J209 Acute bronchitis, unspecified: Secondary | ICD-10-CM | POA: Diagnosis not present

## 2011-11-21 DIAGNOSIS — M549 Dorsalgia, unspecified: Secondary | ICD-10-CM | POA: Diagnosis not present

## 2011-11-21 DIAGNOSIS — K219 Gastro-esophageal reflux disease without esophagitis: Secondary | ICD-10-CM | POA: Diagnosis not present

## 2011-11-21 DIAGNOSIS — F411 Generalized anxiety disorder: Secondary | ICD-10-CM | POA: Diagnosis not present

## 2011-11-21 DIAGNOSIS — K2900 Acute gastritis without bleeding: Secondary | ICD-10-CM | POA: Diagnosis not present

## 2011-11-27 DIAGNOSIS — G43109 Migraine with aura, not intractable, without status migrainosus: Secondary | ICD-10-CM | POA: Diagnosis not present

## 2011-12-29 DIAGNOSIS — H109 Unspecified conjunctivitis: Secondary | ICD-10-CM | POA: Diagnosis not present

## 2012-01-08 DIAGNOSIS — R143 Flatulence: Secondary | ICD-10-CM | POA: Diagnosis not present

## 2012-01-08 DIAGNOSIS — R5381 Other malaise: Secondary | ICD-10-CM | POA: Diagnosis not present

## 2012-01-08 DIAGNOSIS — Z1389 Encounter for screening for other disorder: Secondary | ICD-10-CM | POA: Diagnosis not present

## 2012-01-08 DIAGNOSIS — R635 Abnormal weight gain: Secondary | ICD-10-CM | POA: Diagnosis not present

## 2012-01-08 DIAGNOSIS — R413 Other amnesia: Secondary | ICD-10-CM | POA: Diagnosis not present

## 2012-01-08 DIAGNOSIS — R7989 Other specified abnormal findings of blood chemistry: Secondary | ICD-10-CM | POA: Diagnosis not present

## 2012-01-19 DIAGNOSIS — J029 Acute pharyngitis, unspecified: Secondary | ICD-10-CM | POA: Diagnosis not present

## 2012-01-23 DIAGNOSIS — J309 Allergic rhinitis, unspecified: Secondary | ICD-10-CM | POA: Diagnosis not present

## 2012-01-23 DIAGNOSIS — T7840XA Allergy, unspecified, initial encounter: Secondary | ICD-10-CM | POA: Diagnosis not present

## 2012-01-23 DIAGNOSIS — H579 Unspecified disorder of eye and adnexa: Secondary | ICD-10-CM | POA: Diagnosis not present

## 2012-01-23 DIAGNOSIS — Z79899 Other long term (current) drug therapy: Secondary | ICD-10-CM | POA: Diagnosis not present

## 2012-01-23 DIAGNOSIS — H538 Other visual disturbances: Secondary | ICD-10-CM | POA: Diagnosis not present

## 2012-01-23 DIAGNOSIS — Z77098 Contact with and (suspected) exposure to other hazardous, chiefly nonmedicinal, chemicals: Secondary | ICD-10-CM | POA: Diagnosis not present

## 2012-01-27 DIAGNOSIS — H1045 Other chronic allergic conjunctivitis: Secondary | ICD-10-CM | POA: Diagnosis not present

## 2012-02-05 ENCOUNTER — Other Ambulatory Visit (HOSPITAL_COMMUNITY)
Admission: RE | Admit: 2012-02-05 | Discharge: 2012-02-05 | Disposition: A | Discharge status: Home / Self Care | Payer: Medicare Other | Source: Ambulatory Visit | Attending: Obstetrics and Gynecology | Admitting: Obstetrics and Gynecology

## 2012-02-05 ENCOUNTER — Other Ambulatory Visit: Payer: Self-pay | Admitting: Adult Health

## 2012-02-05 DIAGNOSIS — Z124 Encounter for screening for malignant neoplasm of cervix: Secondary | ICD-10-CM | POA: Diagnosis not present

## 2012-02-05 DIAGNOSIS — Z1212 Encounter for screening for malignant neoplasm of rectum: Secondary | ICD-10-CM | POA: Diagnosis not present

## 2012-02-05 DIAGNOSIS — R87810 Cervical high risk human papillomavirus (HPV) DNA test positive: Secondary | ICD-10-CM | POA: Insufficient documentation

## 2012-02-05 DIAGNOSIS — Z1389 Encounter for screening for other disorder: Secondary | ICD-10-CM | POA: Diagnosis not present

## 2012-03-03 DIAGNOSIS — N870 Mild cervical dysplasia: Secondary | ICD-10-CM | POA: Diagnosis not present

## 2012-03-08 ENCOUNTER — Encounter (INDEPENDENT_AMBULATORY_CARE_PROVIDER_SITE_OTHER): Payer: Self-pay | Admitting: Internal Medicine

## 2012-03-08 ENCOUNTER — Ambulatory Visit (INDEPENDENT_AMBULATORY_CARE_PROVIDER_SITE_OTHER): Payer: Medicare Other | Admitting: Internal Medicine

## 2012-03-08 VITALS — BP 130/70 | Temp 98.2°F | Ht 67.0 in | Wt 125.6 lb

## 2012-03-08 DIAGNOSIS — R143 Flatulence: Secondary | ICD-10-CM | POA: Diagnosis not present

## 2012-03-08 DIAGNOSIS — R140 Abdominal distension (gaseous): Secondary | ICD-10-CM | POA: Insufficient documentation

## 2012-03-08 DIAGNOSIS — R142 Eructation: Secondary | ICD-10-CM | POA: Diagnosis not present

## 2012-03-08 MED ORDER — DICYCLOMINE HCL 10 MG PO CAPS: 10.0000 mg | ORAL_CAPSULE | Freq: Two times a day (BID) | ORAL | Status: DC

## 2012-03-08 NOTE — Progress Notes (Signed)
Subjective:     [name], female   DOB: 1956/02/10, 56 y.o.   MRN: [id number]  HPI  Presents today with a grippy feeling in her epigastric region. She says if she eats a little something, she will feel bloated.  She saw Dr. Olena Leatherwood a couple of weeks ago and was given an Rx for Prilosec.  The Prilosec has helped.  She says she feels like she has gained weight. She has had some constipation. Appetite is good. No weight loss. Epigastric tenderness. BM are grainy at times. No melena or bright red rectal bleeding. Stools are hard at times.  She has a BM every days.  9/27/2010EGD/Colonoscopy: Nine month history of intermittent epigastric pain with early satiety  And fullness.  Negative abdominal US.   No evidence of erosive esophagitis. Few gastric  Polyps at body. Largest one 6-7 mm and biopsied for histology. Non-erosive antral gastritis.  Colonoscopy: Two diverticulum, one at sigmoid, second one at hepatic flexure. A 3mm polyp ablated by cold biopsy from ascending colon. Small external hemorrhoids. Biopsy stomach: Fundic gland polyp.  Ascending colon: Tubular adenoma. Review of Systems Current Outpatient Prescriptions  Medication Sig Dispense Refill  . ALPRAZolam (XANAX XR) 0.5 MG 24 hr tablet Take 1/2 to 2 tablets at bedtime and twice daily as needed for anxiety.  90 tablet  1  . Multiple Vitamin (MULTIVITAMIN) tablet Take 1 tablet by mouth daily.        . naproxen sodium (ANAPROX) 220 MG tablet Take 220 mg by mouth as needed.        . triamcinolone cream (KENALOG) 0.1 % Apply topically 3 (three) times daily. Use x 1 week  15 g  0   Past Medical History  Diagnosis Date  . Chest pain   . Anxiety   . PSVT (paroxysmal supraventricular tachycardia)     Documented by event recorder  . Cervical spine disease     Mild   Past Surgical History  Procedure Date  . Breast enhancement surgery    History   Social History  . Marital Status: Widowed    Spouse Name: N/A    Number of  Children: 3  . Years of Education: N/A   Occupational History  . Unemployed    Social History Main Topics  . Smoking status: Never Smoker   . Smokeless tobacco: Never Used  . Alcohol Use: No  . Drug Use: Not on file  . Sexually Active: Not on file   Other Topics Concern  . Not on file   Social History Narrative  . No narrative on file   Family Status  Relation Status Death Age  . Father Deceased     Dementia  . Mother Deceased     unknown primary neoplasm metastatic to bone  . Brother Alive   . Sister Alive    Allergies  Allergen Reactions  . Penicillins         Objective:   Physical Exam  Filed Vitals:   03/08/12 0939  Height: 5\' 7"  (1.702 m)  Weight: 125 lb 9.6 oz (56.972 kg)  Alert and oriented. Skin warm and dry. Oral mucosa is moist.   . Sclera anicteric, conjunctivae is pink. Thyroid not enlarged. No cervical lymphadenopathy. Lungs clear. Heart regular rate and rhythm.  Abdomen is soft. Bowel sounds are positive. No hepatomegaly. No abdominal masses felt. No tenderness.  No edema to lower extremities.         Assessment:   Bloating.  Feeling of fullness. Symptoms better since starting Prilosec. Has been upset ever since receiving abnormal pap smear.    Plan:    Continue Prilosec. Dicyclomine 10mg  BID. PR in 2 weeks.

## 2012-03-08 NOTE — Patient Instructions (Addendum)
Dicyclomine 10mg  BID. Continue Prilosec. PR in 2 weeks

## 2012-03-25 ENCOUNTER — Encounter (INDEPENDENT_AMBULATORY_CARE_PROVIDER_SITE_OTHER): Payer: Self-pay

## 2012-03-25 DIAGNOSIS — K589 Irritable bowel syndrome without diarrhea: Secondary | ICD-10-CM | POA: Diagnosis not present

## 2012-04-13 ENCOUNTER — Encounter (INDEPENDENT_AMBULATORY_CARE_PROVIDER_SITE_OTHER): Payer: Self-pay | Admitting: Internal Medicine

## 2012-04-13 ENCOUNTER — Ambulatory Visit (INDEPENDENT_AMBULATORY_CARE_PROVIDER_SITE_OTHER): Payer: Medicare Other | Admitting: Internal Medicine

## 2012-04-13 VITALS — BP 106/66 | HR 64 | Temp 98.4°F | Ht 66.0 in | Wt 120.4 lb

## 2012-04-13 DIAGNOSIS — R350 Frequency of micturition: Secondary | ICD-10-CM | POA: Diagnosis not present

## 2012-04-13 DIAGNOSIS — R1013 Epigastric pain: Secondary | ICD-10-CM | POA: Diagnosis not present

## 2012-04-13 DIAGNOSIS — R52 Pain, unspecified: Secondary | ICD-10-CM

## 2012-04-13 DIAGNOSIS — R109 Unspecified abdominal pain: Secondary | ICD-10-CM

## 2012-04-13 LAB — HEPATIC FUNCTION PANEL
ALT: 14 U/L (ref 0–35)
Albumin: 4.7 g/dL (ref 3.5–5.2)
Alkaline Phosphatase: 70 U/L (ref 39–117)
Indirect Bilirubin: 0.6 mg/dL (ref 0.0–0.9)
Total Protein: 7.3 g/dL (ref 6.0–8.3)

## 2012-04-13 LAB — CBC WITH DIFFERENTIAL/PLATELET
Basophils Absolute: 0 10*3/uL (ref 0.0–0.1)
Eosinophils Absolute: 0.1 10*3/uL (ref 0.0–0.7)
Eosinophils Relative: 3 % (ref 0–5)
HCT: 41.8 % (ref 36.0–46.0)
Lymphs Abs: 1.9 10*3/uL (ref 0.7–4.0)
MCH: 32.2 pg (ref 26.0–34.0)
MCV: 92.7 fL (ref 78.0–100.0)
Monocytes Absolute: 0.5 10*3/uL (ref 0.1–1.0)
Platelets: 264 10*3/uL (ref 150–400)
RDW: 12.5 % (ref 11.5–15.5)

## 2012-04-13 NOTE — Progress Notes (Signed)
Subjective:     [name], female   DOB: 04-Aug-1956, 56 y.o.   MRN: [id number]  HPI Presents today for f/u.  She says she is still bloating. She has early satiety. Foods are not lodging in her esophagus. She has abdominal fullness. She also has some constipation which is not new.She actually has a BM every day but her stools are hard at times. She saw Dr.  Olena Leatherwood for a recheck of her allergies and she mentioned that she had some issues with constipation. She was started on Linzess.  . She has lost 5 pounds since her last visit which was intentional.  Her appetite is good. She c/o mid abdominal pain which comes and goes. Pain occurs about 3 times a week. Feels like hunger pains.No pain today. She does c/o headache this am. She is presently being treated for a UTI. She noticed her urine smelled strongly. Placed on Bactrim by PCP.   9/27/2010EGD/Colonoscopy: Nine month history of intermittent epigastric pain with early satiety And fullness. Negative abdominal US. No evidence of erosive esophagitis. Few gastric Polyps at body. Largest one 6-7 mm and biopsied for histology. Non-erosive antral gastritis. Colonoscopy: Two diverticulum, one at sigmoid, second one at hepatic flexure. A 3mm polyp ablated by cold biopsy from ascending colon. Small external hemorrhoids. Biopsy stomach: Fundic gland polyp. Ascending colon: Tubular adenoma.  Review of Systems see hpi Current Outpatient Prescriptions  Medication Sig Dispense Refill  . ALPRAZolam (XANAX XR) 0.5 MG 24 hr tablet Take 1/2 to 2 tablets at bedtime and twice daily as needed for anxiety.  90 tablet  1  . Linaclotide (LINZESS) 145 MCG CAPS Take by mouth daily.      . Multiple Vitamin (MULTIVITAMIN) tablet Take 1 tablet by mouth daily.        Marland Kitchen omeprazole (PRILOSEC) 20 MG capsule Take 20 mg by mouth daily.      Marland Kitchen sulfamethoxazole-trimethoprim (BACTRIM DS) 800-160 MG per tablet Take 1 tablet by mouth 2 (two) times daily.      Marland Kitchen  dicyclomine (BENTYL) 10 MG capsule Take 1 capsule (10 mg total) by mouth 2 (two) times daily before a meal.  60 capsule  2  . naproxen sodium (ANAPROX) 220 MG tablet Take 220 mg by mouth as needed.        . triamcinolone cream (KENALOG) 0.1 % Apply topically 3 (three) times daily. Use x 1 week  15 g  0   Past Medical History  Diagnosis Date  . Chest pain   . Anxiety   . PSVT (paroxysmal supraventricular tachycardia)     Documented by event recorder  . Cervical spine disease     Mild   Past Surgical History  Procedure Date  . Breast enhancement surgery    Family Status  Relation Status Death Age  . Father Deceased     Dementia  . Mother Deceased     unknown primary neoplasm metastatic to bone  . Brother Alive   . Sister Alive    History   Social History  . Marital Status: Widowed    Spouse Name: N/A    Number of Children: 3  . Years of Education: N/A   Occupational History  . Unemployed    Social History Main Topics  . Smoking status: Never Smoker   . Smokeless tobacco: Never Used  . Alcohol Use: No  . Drug Use: Not on file  . Sexually Active: Not on file   Other Topics Concern  .  Not on file   Social History Narrative  . No narrative on file   Allergies  Allergen Reactions  . Penicillins         Objective:   Physical Exam Filed Vitals:   04/13/12 0949  Height: 5\' 6"  (1.676 m)  Weight: 120 lb 6.4 oz (54.613 kg)   Alert and oriented. Skin warm and dry. Oral mucosa is moist.   . Sclera anicteric, conjunctivae is pink. Thyroid not enlarged. No cervical lymphadenopathy. Lungs clear. Heart regular rate and rhythm.  Abdomen is soft. Bowel sounds are positive. No hepatomegaly. No abdominal masses felt. No tenderness.  No edema to lower extremities.       Assessment:    Mid abdominal pain which comes and goes. Possible GERD. UTI: presently under treatment    Plan:    CBC today.  Will recheck a urinalysis on her  and Hepatic function. Continue the Linzess.  Stop the Dicyclomine.

## 2012-04-13 NOTE — Patient Instructions (Addendum)
Will get a CBC, Cmet, and a urine.Further recommendations to follow. Continue the Linzess

## 2012-04-14 ENCOUNTER — Other Ambulatory Visit (INDEPENDENT_AMBULATORY_CARE_PROVIDER_SITE_OTHER): Payer: Self-pay | Admitting: Internal Medicine

## 2012-04-14 DIAGNOSIS — R140 Abdominal distension (gaseous): Secondary | ICD-10-CM

## 2012-04-14 DIAGNOSIS — R1013 Epigastric pain: Secondary | ICD-10-CM

## 2012-04-14 LAB — URINALYSIS
Bilirubin Urine: NEGATIVE
Glucose, UA: NEGATIVE mg/dL
Ketones, ur: NEGATIVE mg/dL
Protein, ur: NEGATIVE mg/dL
Urobilinogen, UA: 0.2 mg/dL (ref 0.0–1.0)

## 2012-04-19 ENCOUNTER — Encounter (HOSPITAL_COMMUNITY)
Admission: RE | Admit: 2012-04-19 | Discharge: 2012-04-19 | Disposition: A | Discharge status: Home / Self Care | Payer: Medicare Other | Source: Ambulatory Visit | Attending: Internal Medicine | Admitting: Internal Medicine

## 2012-04-19 DIAGNOSIS — R1013 Epigastric pain: Secondary | ICD-10-CM | POA: Insufficient documentation

## 2012-04-19 DIAGNOSIS — R140 Abdominal distension (gaseous): Secondary | ICD-10-CM

## 2012-04-19 DIAGNOSIS — R141 Gas pain: Secondary | ICD-10-CM | POA: Insufficient documentation

## 2012-04-22 ENCOUNTER — Encounter (HOSPITAL_COMMUNITY): Payer: Medicare Other

## 2012-04-26 ENCOUNTER — Encounter (HOSPITAL_COMMUNITY): Payer: Self-pay

## 2012-04-26 ENCOUNTER — Encounter (HOSPITAL_COMMUNITY)
Admission: RE | Admit: 2012-04-26 | Discharge: 2012-04-26 | Disposition: A | Discharge status: Home / Self Care | Payer: Medicare Other | Source: Ambulatory Visit | Attending: Internal Medicine | Admitting: Internal Medicine

## 2012-04-26 DIAGNOSIS — R6881 Early satiety: Secondary | ICD-10-CM | POA: Diagnosis not present

## 2012-04-26 DIAGNOSIS — R1013 Epigastric pain: Secondary | ICD-10-CM | POA: Diagnosis not present

## 2012-04-26 DIAGNOSIS — R932 Abnormal findings on diagnostic imaging of liver and biliary tract: Secondary | ICD-10-CM | POA: Diagnosis not present

## 2012-04-26 DIAGNOSIS — R142 Eructation: Secondary | ICD-10-CM | POA: Diagnosis not present

## 2012-04-26 DIAGNOSIS — R143 Flatulence: Secondary | ICD-10-CM | POA: Diagnosis not present

## 2012-04-26 MED ORDER — TECHNETIUM TC 99M MEBROFENIN IV KIT
5.0000 | PACK | Freq: Once | INTRAVENOUS | Status: AC | PRN
  Administered 2012-04-26: 5 via INTRAVENOUS

## 2012-04-28 ENCOUNTER — Telehealth (INDEPENDENT_AMBULATORY_CARE_PROVIDER_SITE_OTHER): Payer: Self-pay | Admitting: Internal Medicine

## 2012-04-28 NOTE — Telephone Encounter (Signed)
Spoke to Amy w/ Dr Dereck Leep, Ms Bram has appt with him 04/29/12 to discuss surgical consult for gallbladder, if patient decides to have surgery they will handle referral since they are PCP and patient has [facility]

## 2012-04-28 NOTE — Telephone Encounter (Signed)
Spoke with patient this am. She would like a referral to Dr. Lovell Sheehan for her abnormal HIDA scan.    April Knox needs a referral to Dr. Lovell Sheehan.

## 2012-05-31 DIAGNOSIS — N952 Postmenopausal atrophic vaginitis: Secondary | ICD-10-CM | POA: Diagnosis not present

## 2012-05-31 DIAGNOSIS — N951 Menopausal and female climacteric states: Secondary | ICD-10-CM | POA: Diagnosis not present

## 2012-06-08 DIAGNOSIS — D485 Neoplasm of uncertain behavior of skin: Secondary | ICD-10-CM | POA: Diagnosis not present

## 2012-06-08 DIAGNOSIS — L570 Actinic keratosis: Secondary | ICD-10-CM | POA: Diagnosis not present

## 2012-06-08 DIAGNOSIS — D2360 Other benign neoplasm of skin of unspecified upper limb, including shoulder: Secondary | ICD-10-CM | POA: Diagnosis not present

## 2012-06-08 DIAGNOSIS — Z85828 Personal history of other malignant neoplasm of skin: Secondary | ICD-10-CM | POA: Diagnosis not present

## 2012-06-25 DIAGNOSIS — J209 Acute bronchitis, unspecified: Secondary | ICD-10-CM | POA: Diagnosis not present

## 2012-06-28 DIAGNOSIS — L723 Sebaceous cyst: Secondary | ICD-10-CM | POA: Diagnosis not present

## 2012-07-13 ENCOUNTER — Ambulatory Visit (INDEPENDENT_AMBULATORY_CARE_PROVIDER_SITE_OTHER): Payer: Medicare Other | Admitting: Internal Medicine

## 2012-07-13 ENCOUNTER — Encounter (INDEPENDENT_AMBULATORY_CARE_PROVIDER_SITE_OTHER): Payer: Self-pay | Admitting: Internal Medicine

## 2012-07-13 VITALS — BP 130/76 | HR 78 | Temp 97.8°F | Resp 20 | Ht 66.0 in | Wt 119.4 lb

## 2012-07-13 DIAGNOSIS — Z87898 Personal history of other specified conditions: Secondary | ICD-10-CM

## 2012-07-13 MED ORDER — DICYCLOMINE HCL 10 MG PO CAPS: 10.0000 mg | ORAL_CAPSULE | Freq: Two times a day (BID) | ORAL | Status: DC | PRN

## 2012-07-13 NOTE — Progress Notes (Signed)
Presenting complaint;  Followup for abdominal pain and bloating.  Subjective:  Patient is 56 year old Caucasian female who was initially evaluated in July 2013 by Ms. Raynelle Bring for epigastric pain and bloating. She had negative upper abdominal ultrasound. She was given dicyclomine. She previously had tried Prilosec but it did not help. She underwent HIDA scan with CCK and an EF was low at 21.8% her symptoms were not reproduced. In the meantime she's changed her eating habits and has noted resolution of bloating and epigastric pain and therefore did not seek surgical opinion. She is not taking dicyclomine she still has prescription. She does not take naproxen very often. At the most she may take a couple of doses in a month  Current Medications: Current Outpatient Prescriptions  Medication Sig Dispense Refill  . ALPRAZolam (XANAX XR) 0.5 MG 24 hr tablet Take 1/2 to 2 tablets at bedtime and twice daily as needed for anxiety.  90 tablet  1  . Multiple Vitamin (MULTIVITAMIN) tablet Take 1 tablet by mouth daily.        . naproxen sodium (ANAPROX) 220 MG tablet Take 220 mg by mouth as needed.        . dicyclomine (BENTYL) 10 MG capsule Take 1 capsule (10 mg total) by mouth 2 (two) times daily before a meal.  60 capsule  2     Objective: Blood pressure 130/76, pulse 78, temperature 97.8 F (36.6 C), temperature source Oral, resp. rate 20, height 5\' 6"  (1.676 m), weight 119 lb 6.4 oz (54.159 kg). Patient does not appear to be in any distress. Conjunctiva is pink. Sclera is nonicteric Oropharyngeal mucosa is normal. No neck masses or thyromegaly noted. Abdomen is flat. Bowel sounds are normal. Abdomen is soft and nontender without organomegaly or masses No LE edema or clubbing noted.  Labs/studies Results: HIDA scan results as above. Study performed on 04/27/2012.  Assessment:  #1. Epigastric pain and bloating has resolved. She has changed her eating habits and trying to eat healthy and  she is not even taking dicyclomine. She had low ejection fraction on HIDA scan. However her symptoms are not reproduced and at this point there is no indication for cholecystitis. If symptoms are recurrent may consider repeating this study. #2. She is up-to-date on screening for CRC . She had colonoscopy in September 2010 with removal of 3 mm adenoma. She could wait another 4 years before her next colonoscopy.   Plan:  Call if abdominal pain recurs. Can use dicyclomine 10 mg twice a day when necessary. Office visit in one year.

## 2012-07-13 NOTE — Patient Instructions (Signed)
Call if abdominal pain recurs. 

## 2012-07-22 ENCOUNTER — Encounter: Payer: Self-pay | Admitting: Cardiology

## 2012-07-22 ENCOUNTER — Ambulatory Visit (INDEPENDENT_AMBULATORY_CARE_PROVIDER_SITE_OTHER): Payer: Medicare Other | Admitting: Cardiology

## 2012-07-22 VITALS — BP 110/78 | HR 62 | Ht 66.0 in | Wt 119.8 lb

## 2012-07-22 DIAGNOSIS — Z0189 Encounter for other specified special examinations: Secondary | ICD-10-CM

## 2012-07-22 DIAGNOSIS — I471 Supraventricular tachycardia, unspecified: Secondary | ICD-10-CM

## 2012-07-22 NOTE — Assessment & Plan Note (Signed)
Patient is asymptomatic and has not required medication for many months.  She is encouraged to call the office should she experience prolonged palpitations or apparent arrhythmia associated with other symptoms.

## 2012-07-22 NOTE — Progress Notes (Signed)
[name], female   DOB: 19-Sep-1955, 56 y.o.   MRN: [id number]  HPI: Scheduled return visit for this nice woman with a history of PSVT.  That seemed to last visit one year ago, she has done generally well.  She was hospitalized briefly on one occasion for a viral syndrome with dehydration.  Otherwise, she has remained perfectly healthy.  She occasionally notes very brief episodes of palpitations.  Prior to Admission medications   Medication Sig Start Date End Date Taking? Authorizing Provider  ALPRAZolam (XANAX XR) 0.5 MG 24 hr tablet Take 1/2 to 2 tablets at bedtime and twice daily as needed for anxiety. 06/26/11  Yes Kathlen Brunswick, MD  Fexofenadine-Pseudoephedrine (ALLEGRA-D PO) Take 1 tablet by mouth daily.   Yes Historical Provider, MD  Multiple Vitamin (MULTIVITAMIN) tablet Take 1 tablet by mouth daily.     Yes Historical Provider, MD   Allergies  Allergen Reactions  . Penicillins      Past medical history, social history, and family history reviewed and updated.  ROS: Denies chest pain, dyspnea, lightheadedness or syncope.  She walks regularly without difficulty.  All other systems reviewed and are negative.  PHYSICAL EXAM: BP 110/78  Pulse 62  Ht 5\' 6"  (1.676 m)  Wt 54.341 kg (119 lb 12.8 oz)  BMI 19.34 kg/m2  SpO2 98%   General-Well developed; no acute distress Body habitus-proportionate weight and height Neck-No JVD; no carotid bruits Lungs-clear lung fields; resonant to percussion Cardiovascular-normal PMI;Split S1 and normal S2 Abdomen-normal bowel sounds; soft and non-tender without masses or organomegaly Musculoskeletal-No deformities, no cyanosis or clubbing Neurologic-Normal cranial nerves; symmetric strength and tone Skin-Warm, no significant lesions Extremities-distal pulses intact; no edema  ASSESSMENT AND PLAN:  [place] Bing, MD 07/22/2012 1:15 PM

## 2012-07-22 NOTE — Progress Notes (Deleted)
Name: April Knox    DOB: 1955/09/17  Age: 56 y.o.  MR#: [id number]       PCP:  Toma Deiters, MD      Insurance: @PAYORNAME @    Labs 8/13 MEDICATION LIST   CC:    Chief Complaint  Patient presents with  . Follow-up    Occassional elevated heart rate    VS BP 110/78  Pulse 62  Ht 5\' 6"  (1.676 m)  Wt 119 lb 12.8 oz (54.341 kg)  BMI 19.34 kg/m2  SpO2 98%  Weights Current Weight  07/22/12 119 lb 12.8 oz (54.341 kg)  07/13/12 119 lb 6.4 oz (54.159 kg)  04/13/12 120 lb 6.4 oz (54.613 kg)    Blood Pressure  BP Readings from Last 3 Encounters:  07/22/12 110/78  07/13/12 130/76  04/13/12 106/66     Admit date:  (Not on file) Last encounter with RMR:  Visit date not found   Allergy Allergies  Allergen Reactions  . Penicillins     Current Outpatient Prescriptions  Medication Sig Dispense Refill  . ALPRAZolam (XANAX XR) 0.5 MG 24 hr tablet Take 1/2 to 2 tablets at bedtime and twice daily as needed for anxiety.  90 tablet  1  . Fexofenadine-Pseudoephedrine (ALLEGRA-D PO) Take 1 tablet by mouth daily.      . Multiple Vitamin (MULTIVITAMIN) tablet Take 1 tablet by mouth daily.          Discontinued Meds:    Medications Discontinued During This Encounter  Medication Reason  . dicyclomine (BENTYL) 10 MG capsule Error  . naproxen sodium (ANAPROX) 220 MG tablet Error    Patient Active Problem List  Diagnosis  . MUSCULOSKELETAL PAIN  . Anxiety  . PSVT (paroxysmal supraventricular tachycardia)  . Skin eruption  . Bloating  . Abdominal pain, acute, epigastric    LABS No visits with results within 3 Month(s) from this visit. Latest known visit with results is:  Office Visit on 04/13/2012  Component Date Value  . Color, Urine 04/13/2012 YELLOW   . APPearance 04/13/2012 CLEAR   . Specific Gravity, Urine 04/13/2012 1.020   . pH 04/13/2012 7.0   . Glucose, UA 04/13/2012 NEG   . Bilirubin Urine 04/13/2012 NEG   . Ketones, ur 04/13/2012 NEG   . Hgb urine  dipstick 04/13/2012 NEG   . Protein, ur 04/13/2012 NEG   . Urobilinogen, UA 04/13/2012 0.2   . Nitrite 04/13/2012 NEG   . Leukocytes, UA 04/13/2012 NEG   . WBC 04/13/2012 4.1   . RBC 04/13/2012 4.51   . Hemoglobin 04/13/2012 14.5   . HCT 04/13/2012 41.8   . MCV 04/13/2012 92.7   . [facility] 04/13/2012 32.2   . MCHC 04/13/2012 34.7   . RDW 04/13/2012 12.5   . Platelets 04/13/2012 264   . Neutrophils Relative 04/13/2012 40*  . Neutro Abs 04/13/2012 1.7   . Lymphocytes Relative 04/13/2012 45   . Lymphs Abs 04/13/2012 1.9   . Monocytes Relative 04/13/2012 11   . Monocytes Absolute 04/13/2012 0.5   . Eosinophils Relative 04/13/2012 3   . Eosinophils Absolute 04/13/2012 0.1   . Basophils Relative 04/13/2012 1   . Basophils Absolute 04/13/2012 0.0   . Smear Review 04/13/2012 Criteria for review not met   . Total Bilirubin 04/13/2012 0.7   . Bilirubin, Direct 04/13/2012 0.1   . Indirect Bilirubin 04/13/2012 0.6   . Alkaline Phosphatase 04/13/2012 70   . AST 04/13/2012 22   . ALT 04/13/2012 14   .  Total Protein 04/13/2012 7.3   . Albumin 04/13/2012 4.7      Results for this Opt Visit:     Results for orders placed in visit on 04/13/12  URINALYSIS      Component Value Range   Color, Urine YELLOW  YELLOW   APPearance CLEAR  CLEAR   Specific Gravity, Urine 1.020  1.005 - 1.030   pH 7.0  5.0 - 8.0   Glucose, UA NEG  NEG mg/dL   Bilirubin Urine NEG  NEG   Ketones, ur NEG  NEG mg/dL   Hgb urine dipstick NEG  NEG   Protein, ur NEG  NEG mg/dL   Urobilinogen, UA 0.2  0.0 - 1.0 mg/dL   Nitrite NEG  NEG   Leukocytes, UA NEG  NEG  CBC WITH DIFFERENTIAL      Component Value Range   WBC 4.1  4.0 - 10.5 K/uL   RBC 4.51  3.87 - 5.11 MIL/uL   Hemoglobin 14.5  12.0 - 15.0 g/dL   HCT 16.1  09.6 - 04.5 %   MCV 92.7  78.0 - 100.0 fL   MCH 32.2  26.0 - 34.0 pg   MCHC 34.7  30.0 - 36.0 g/dL   RDW 40.9  81.1 - 91.4 %   Platelets 264  150 - 400 K/uL   Neutrophils Relative 40 (*) 43 - 77 %    Neutro Abs 1.7  1.7 - 7.7 K/uL   Lymphocytes Relative 45  12 - 46 %   Lymphs Abs 1.9  0.7 - 4.0 K/uL   Monocytes Relative 11  3 - 12 %   Monocytes Absolute 0.5  0.1 - 1.0 K/uL   Eosinophils Relative 3  0 - 5 %   Eosinophils Absolute 0.1  0.0 - 0.7 K/uL   Basophils Relative 1  0 - 1 %   Basophils Absolute 0.0  0.0 - 0.1 K/uL   Smear Review Criteria for review not met    HEPATIC FUNCTION PANEL      Component Value Range   Total Bilirubin 0.7  0.3 - 1.2 mg/dL   Bilirubin, Direct 0.1  0.0 - 0.3 mg/dL   Indirect Bilirubin 0.6  0.0 - 0.9 mg/dL   Alkaline Phosphatase 70  39 - 117 U/L   AST 22  0 - 37 U/L   ALT 14  0 - 35 U/L   Total Protein 7.3  6.0 - 8.3 g/dL   Albumin 4.7  3.5 - 5.2 g/dL    EKG Orders placed in visit on 07/29/11  . EKG 12-LEAD     Prior Assessment and Plan Problem List as of 07/22/2012            Cardiology Problems   PSVT (paroxysmal supraventricular tachycardia)   Last Assessment & Plan Note   07/29/2011 Office Visit Addendum 07/29/2011  4:03 PM by Kathlen Brunswick, MD    Patient has not had symptoms nor other clinical manifestations of supraventricular tachycardia in years.  She does not appear to require pharmacologic therapy.      Other   MUSCULOSKELETAL PAIN   Anxiety   Last Assessment & Plan Note   07/29/2011 Office Visit Addendum 07/30/2011  9:41 PM by Kathlen Brunswick, MD    Patient appears to be suffering a normal grief reaction less than one month following the death of her father for whom she provided total care during the past few years.  We discussed her emotions.  She is benefiting  from use of Ativan, but does not appear to be taking this frequently.  I advised that she find an activity that will absorb her time and provide her with a sense of a fullfillment.  Current chest discomfort is brief, intermittent, relatively nonthreatening and very atypical.  As such, it is more likely a manifestation of her emotional stress than a significant  underlying physical condition.    Skin eruption   Last Assessment & Plan Note   07/29/2011 Office Visit Addendum 07/30/2011  9:41 PM by Kathlen Brunswick, MD    Minor papular skin eruption exacerbated by manual manipulation.  Dr. Olena Leatherwood has prescribed oral antibiotics.  I will add a moderately potent steroid cream to use for no more than one week.    Bloating   Abdominal pain, acute, epigastric       Imaging: No results found.   FRS Calculation: Score not calculated. Missing: Total Cholesterol

## 2012-08-04 ENCOUNTER — Telehealth: Payer: Self-pay | Admitting: Cardiology

## 2012-08-04 NOTE — Telephone Encounter (Signed)
Patient would like a copy of her AVS from the last visit mailed to her.

## 2012-08-04 NOTE — Telephone Encounter (Signed)
Copy sent to patient, per her request

## 2012-08-05 DIAGNOSIS — M549 Dorsalgia, unspecified: Secondary | ICD-10-CM | POA: Diagnosis not present

## 2012-08-06 DIAGNOSIS — N760 Acute vaginitis: Secondary | ICD-10-CM | POA: Diagnosis not present

## 2012-08-31 DIAGNOSIS — H1130 Conjunctival hemorrhage, unspecified eye: Secondary | ICD-10-CM | POA: Diagnosis not present

## 2012-09-13 DIAGNOSIS — N898 Other specified noninflammatory disorders of vagina: Secondary | ICD-10-CM | POA: Diagnosis not present

## 2012-09-15 DIAGNOSIS — Z79899 Other long term (current) drug therapy: Secondary | ICD-10-CM | POA: Diagnosis not present

## 2012-09-15 DIAGNOSIS — M549 Dorsalgia, unspecified: Secondary | ICD-10-CM | POA: Diagnosis not present

## 2012-09-15 DIAGNOSIS — Z5181 Encounter for therapeutic drug level monitoring: Secondary | ICD-10-CM | POA: Diagnosis not present

## 2012-09-23 DIAGNOSIS — M549 Dorsalgia, unspecified: Secondary | ICD-10-CM | POA: Diagnosis not present

## 2012-09-28 ENCOUNTER — Other Ambulatory Visit (HOSPITAL_COMMUNITY)
Admission: RE | Admit: 2012-09-28 | Discharge: 2012-09-28 | Disposition: A | Discharge status: Home / Self Care | Payer: Medicare Other | Source: Ambulatory Visit | Attending: Obstetrics and Gynecology | Admitting: Obstetrics and Gynecology

## 2012-09-28 ENCOUNTER — Other Ambulatory Visit: Payer: Self-pay | Admitting: Adult Health

## 2012-09-28 DIAGNOSIS — R002 Palpitations: Secondary | ICD-10-CM | POA: Diagnosis not present

## 2012-09-28 DIAGNOSIS — R7989 Other specified abnormal findings of blood chemistry: Secondary | ICD-10-CM | POA: Diagnosis not present

## 2012-09-28 DIAGNOSIS — Z1151 Encounter for screening for human papillomavirus (HPV): Secondary | ICD-10-CM | POA: Diagnosis not present

## 2012-09-28 DIAGNOSIS — Z01419 Encounter for gynecological examination (general) (routine) without abnormal findings: Secondary | ICD-10-CM | POA: Diagnosis not present

## 2012-09-28 DIAGNOSIS — Z124 Encounter for screening for malignant neoplasm of cervix: Secondary | ICD-10-CM | POA: Diagnosis not present

## 2012-09-28 DIAGNOSIS — F410 Panic disorder [episodic paroxysmal anxiety] without agoraphobia: Secondary | ICD-10-CM | POA: Diagnosis not present

## 2012-09-28 DIAGNOSIS — Z0142 Encounter for cervical smear to confirm findings of recent normal smear following initial abnormal smear: Secondary | ICD-10-CM | POA: Diagnosis not present

## 2012-10-19 DIAGNOSIS — D485 Neoplasm of uncertain behavior of skin: Secondary | ICD-10-CM | POA: Diagnosis not present

## 2012-10-19 DIAGNOSIS — D1800 Hemangioma unspecified site: Secondary | ICD-10-CM | POA: Diagnosis not present

## 2012-10-19 DIAGNOSIS — L570 Actinic keratosis: Secondary | ICD-10-CM | POA: Diagnosis not present

## 2012-11-01 DIAGNOSIS — Z1231 Encounter for screening mammogram for malignant neoplasm of breast: Secondary | ICD-10-CM | POA: Diagnosis not present

## 2012-11-01 DIAGNOSIS — Z978 Presence of other specified devices: Secondary | ICD-10-CM | POA: Diagnosis not present

## 2012-11-02 DIAGNOSIS — J029 Acute pharyngitis, unspecified: Secondary | ICD-10-CM | POA: Diagnosis not present

## 2012-11-17 ENCOUNTER — Telehealth: Payer: Self-pay | Admitting: Adult Health

## 2012-11-17 DIAGNOSIS — F064 Anxiety disorder due to known physiological condition: Secondary | ICD-10-CM | POA: Diagnosis not present

## 2012-11-17 NOTE — Telephone Encounter (Signed)
Patient has back pain and wanted meds told her to talk with her pcp

## 2012-12-15 DIAGNOSIS — J0100 Acute maxillary sinusitis, unspecified: Secondary | ICD-10-CM | POA: Diagnosis not present

## 2013-01-10 DIAGNOSIS — J209 Acute bronchitis, unspecified: Secondary | ICD-10-CM | POA: Diagnosis not present

## 2013-01-11 DIAGNOSIS — H81399 Other peripheral vertigo, unspecified ear: Secondary | ICD-10-CM | POA: Diagnosis not present

## 2013-01-11 DIAGNOSIS — M542 Cervicalgia: Secondary | ICD-10-CM | POA: Diagnosis not present

## 2013-01-11 DIAGNOSIS — G541 Lumbosacral plexus disorders: Secondary | ICD-10-CM | POA: Diagnosis not present

## 2013-01-11 DIAGNOSIS — S139XXA Sprain of joints and ligaments of unspecified parts of neck, initial encounter: Secondary | ICD-10-CM | POA: Diagnosis not present

## 2013-01-11 DIAGNOSIS — Z79899 Other long term (current) drug therapy: Secondary | ICD-10-CM | POA: Diagnosis not present

## 2013-01-11 DIAGNOSIS — H8190 Unspecified disorder of vestibular function, unspecified ear: Secondary | ICD-10-CM | POA: Diagnosis not present

## 2013-01-14 DIAGNOSIS — H1045 Other chronic allergic conjunctivitis: Secondary | ICD-10-CM | POA: Diagnosis not present

## 2013-01-18 ENCOUNTER — Ambulatory Visit (INDEPENDENT_AMBULATORY_CARE_PROVIDER_SITE_OTHER): Payer: Medicare Other | Admitting: Adult Health

## 2013-01-18 ENCOUNTER — Encounter: Payer: Self-pay | Admitting: Adult Health

## 2013-01-18 ENCOUNTER — Telehealth: Payer: Self-pay | Admitting: *Deleted

## 2013-01-18 VITALS — BP 116/74 | HR 64 | Ht 66.0 in | Wt 122.0 lb

## 2013-01-18 DIAGNOSIS — I471 Supraventricular tachycardia: Secondary | ICD-10-CM

## 2013-01-18 DIAGNOSIS — F411 Generalized anxiety disorder: Secondary | ICD-10-CM

## 2013-01-18 DIAGNOSIS — F419 Anxiety disorder, unspecified: Secondary | ICD-10-CM

## 2013-01-18 NOTE — Progress Notes (Signed)
   HPI: April Knox is a 57 year old patient of Dr. Dietrich Pates we are following for ongoing assessment and management of PSVT. She was last seen in the office in November of 2013 and was without complaint of palpitations. No medications were changed.   She works out 5 days a week, cycling, walking and aerobics with a kettle bell. She has had one episode chest pain, after working out. No complaints of palpitations.   Allergies  Allergen Reactions  . Penicillins     Current Outpatient Prescriptions  Medication Sig Dispense Refill  . cetirizine (ZYRTEC) 10 MG tablet Take 10 mg by mouth daily.      . Multiple Vitamin (MULTIVITAMIN) tablet Take 1 tablet by mouth daily.        Marland Kitchen ALPRAZolam (XANAX XR) 0.5 MG 24 hr tablet Take 1/2 to 2 tablets at bedtime and twice daily as needed for anxiety.  90 tablet  1   No current facility-administered medications for this visit.    Past Medical History  Diagnosis Date  . Chest pain   . Anxiety   . PSVT (paroxysmal supraventricular tachycardia)     Documented by event recorder  . Cervical spine disease     Mild    Past Surgical History  Procedure Laterality Date  . Breast enhancement surgery      ZOX:WRUEAV of systems complete and found to be negative unless listed above PHYSICAL EXAM BP 116/74  Pulse 64  Ht 5\' 6"  (1.676 m)  Wt 122 lb (55.339 kg)  BMI 19.7 kg/m2  General: Well developed, well nourished, in no acute distress Head: Eyes PERRLA, No xanthomas.   Normal cephalic and atramatic  Lungs: Clear bilaterally to auscultation and percussion. Heart: HRRR S1 S2, without MRG.  Pulses are 2+ & equal.            No carotid bruit. No JVD.  No abdominal bruits. No femoral bruits. Abdomen: Bowel sounds are positive, abdomen soft and non-tender without masses or                  Hernia's noted. Msk:  Back normal, normal gait. Normal strength and tone for age. Extremities: No clubbing, cyanosis or edema.  DP +1 Neuro: Alert and oriented X  3. Psych:  Good affect, responds appropriately  EKG: NSR   ASSESSMENT AND PLAN

## 2013-01-18 NOTE — Assessment & Plan Note (Signed)
Occasional occurences. Will defer to PCP

## 2013-01-18 NOTE — Telephone Encounter (Signed)
PT WANTS TO KNOW IF SHE CAN TAKE SINGULAR AND ZYRTECK TOGETHER ALL THE TIME. PCP HAS HER ON THIS.

## 2013-01-18 NOTE — Assessment & Plan Note (Signed)
No further complaints of PSVT. Chest pain most likely musculoskeletal. No changes in medication regimen. EKG unchanged. Will see in one year unless symptomatic.

## 2013-01-18 NOTE — Patient Instructions (Addendum)
The current medical regimen is effective;  continue present plan and medications.  Follow up in 1 year with Joni Reining, NP.  You will receive a letter in the mail 2 months before you are due.  Please call us when you receive this letter to schedule your follow up appointment.

## 2013-01-21 DIAGNOSIS — J301 Allergic rhinitis due to pollen: Secondary | ICD-10-CM | POA: Diagnosis not present

## 2013-01-31 ENCOUNTER — Telehealth: Payer: Self-pay | Admitting: Adult Health

## 2013-01-31 DIAGNOSIS — F064 Anxiety disorder due to known physiological condition: Secondary | ICD-10-CM | POA: Diagnosis not present

## 2013-01-31 MED ORDER — ALPRAZOLAM ER 0.5 MG PO TB24: ORAL_TABLET | ORAL | Status: DC

## 2013-01-31 NOTE — Telephone Encounter (Signed)
feeling anxious wants refill on xanax will call in 1 to [facility] in [place]

## 2013-02-09 DIAGNOSIS — M542 Cervicalgia: Secondary | ICD-10-CM | POA: Diagnosis not present

## 2013-02-09 DIAGNOSIS — H8190 Unspecified disorder of vestibular function, unspecified ear: Secondary | ICD-10-CM | POA: Diagnosis not present

## 2013-02-09 DIAGNOSIS — G541 Lumbosacral plexus disorders: Secondary | ICD-10-CM | POA: Diagnosis not present

## 2013-02-09 DIAGNOSIS — Z79899 Other long term (current) drug therapy: Secondary | ICD-10-CM | POA: Diagnosis not present

## 2013-02-09 DIAGNOSIS — H81399 Other peripheral vertigo, unspecified ear: Secondary | ICD-10-CM | POA: Diagnosis not present

## 2013-02-14 DIAGNOSIS — L906 Striae atrophicae: Secondary | ICD-10-CM | POA: Diagnosis not present

## 2013-02-14 DIAGNOSIS — L570 Actinic keratosis: Secondary | ICD-10-CM | POA: Diagnosis not present

## 2013-02-21 DIAGNOSIS — F4321 Adjustment disorder with depressed mood: Secondary | ICD-10-CM | POA: Diagnosis not present

## 2013-02-21 DIAGNOSIS — F411 Generalized anxiety disorder: Secondary | ICD-10-CM | POA: Diagnosis not present

## 2013-02-24 DIAGNOSIS — M5030 Other cervical disc degeneration, unspecified cervical region: Secondary | ICD-10-CM | POA: Diagnosis not present

## 2013-02-24 DIAGNOSIS — IMO0001 Reserved for inherently not codable concepts without codable children: Secondary | ICD-10-CM | POA: Diagnosis not present

## 2013-03-01 DIAGNOSIS — M5030 Other cervical disc degeneration, unspecified cervical region: Secondary | ICD-10-CM | POA: Diagnosis not present

## 2013-03-01 DIAGNOSIS — IMO0001 Reserved for inherently not codable concepts without codable children: Secondary | ICD-10-CM | POA: Diagnosis not present

## 2013-03-03 ENCOUNTER — Encounter: Payer: Self-pay | Admitting: Adult Health

## 2013-03-03 ENCOUNTER — Ambulatory Visit (INDEPENDENT_AMBULATORY_CARE_PROVIDER_SITE_OTHER): Payer: Medicare Other | Admitting: Adult Health

## 2013-03-03 ENCOUNTER — Other Ambulatory Visit (HOSPITAL_COMMUNITY)
Admission: RE | Admit: 2013-03-03 | Discharge: 2013-03-03 | Disposition: A | Discharge status: Home / Self Care | Payer: Medicare Other | Source: Ambulatory Visit | Attending: Adult Health | Admitting: Adult Health

## 2013-03-03 VITALS — BP 120/72 | HR 70 | Ht 66.0 in | Wt 126.0 lb

## 2013-03-03 DIAGNOSIS — Z0000 Encounter for general adult medical examination without abnormal findings: Secondary | ICD-10-CM | POA: Diagnosis not present

## 2013-03-03 DIAGNOSIS — Z01419 Encounter for gynecological examination (general) (routine) without abnormal findings: Secondary | ICD-10-CM | POA: Diagnosis not present

## 2013-03-03 DIAGNOSIS — Z9889 Other specified postprocedural states: Secondary | ICD-10-CM | POA: Insufficient documentation

## 2013-03-03 DIAGNOSIS — N816 Rectocele: Secondary | ICD-10-CM

## 2013-03-03 DIAGNOSIS — Z1212 Encounter for screening for malignant neoplasm of rectum: Secondary | ICD-10-CM | POA: Diagnosis not present

## 2013-03-03 DIAGNOSIS — Z124 Encounter for screening for malignant neoplasm of cervix: Secondary | ICD-10-CM | POA: Diagnosis not present

## 2013-03-03 DIAGNOSIS — Z1151 Encounter for screening for human papillomavirus (HPV): Secondary | ICD-10-CM | POA: Insufficient documentation

## 2013-03-03 DIAGNOSIS — Z87898 Personal history of other specified conditions: Secondary | ICD-10-CM

## 2013-03-03 DIAGNOSIS — Z9882 Breast implant status: Secondary | ICD-10-CM

## 2013-03-03 HISTORY — DX: Breast implant status: Z98.82

## 2013-03-03 HISTORY — DX: Rectocele: N81.6

## 2013-03-03 LAB — HEMOCCULT GUIAC POC 1CARD (OFFICE): Fecal Occult Blood, POC: NEGATIVE

## 2013-03-03 NOTE — Progress Notes (Signed)
[name], female   DOB: 1955-10-13, 57 y.o.   MRN: [id number] History of Present Illness: April Knox is a 57 year old white female widowed, in for a pap and physical.   Current Medications, Allergies, Past Medical History, Past Surgical History, Family History and Social History were reviewed in [facility] electronic medical record.     Review of Systems: Ptient denies any headaches, blurred vision, shortness of breath, chest pain, abdominal pain, problems with bowel movements, urination, or intercourse. No joint pain or mood changes, she some occasional constipation and bloating and vaginal dryness.Uses Luvena at times, discussed could use olive oil.    Physical Exam:BP 120/72  Pulse 70  Ht 5\' 6"  (1.676 m)  Wt 126 lb (57.153 kg)  BMI 20.35 kg/m2 General:  Well developed, well nourished, no acute distress Skin:  Warm and dry, tan Neck:  Midline trachea, normal thyroid Lungs; Clear to auscultation bilaterally Breast:  No dominant palpable mass, retraction, or nipple discharge, she has bilateral implants and a normal mammogram in March. Cardiovascular: Regular rate and rhythm Abdomen:  Soft, non tender, no hepatosplenomegaly Pelvic:  External genitalia is normal in appearance.  The vagina is normal in appearance.  The cervix is bulbous. Pap performed with HPV. No  adnexal masses or tenderness noted.Uterus NSSC, non tender, Rectal: Good sphincter tone, no polyps, or hemorrhoids felt.  Hemoccult negative.+ rectocele Extremities:  No swelling or varicosities noted Psych:  Alert and cooperative and seems happy   Impression: Yearly gyn exam  Rectocele History of abnormal pap Constipation   Plan: Physical in 1 year Mammogram yearly Review handout on rectocele Colonoscopy in 2020 Eat prunes daily

## 2013-03-03 NOTE — Patient Instructions (Addendum)
Follow up in 1 year for physical Mammogram yearly Colonoscopy in 2020

## 2013-03-07 ENCOUNTER — Telehealth: Payer: Self-pay | Admitting: Adult Health

## 2013-03-07 DIAGNOSIS — IMO0001 Reserved for inherently not codable concepts without codable children: Secondary | ICD-10-CM | POA: Diagnosis not present

## 2013-03-07 DIAGNOSIS — M5030 Other cervical disc degeneration, unspecified cervical region: Secondary | ICD-10-CM | POA: Diagnosis not present

## 2013-03-07 NOTE — Telephone Encounter (Signed)
Left message pap normal  

## 2013-03-10 DIAGNOSIS — H8190 Unspecified disorder of vestibular function, unspecified ear: Secondary | ICD-10-CM | POA: Diagnosis not present

## 2013-03-10 DIAGNOSIS — G541 Lumbosacral plexus disorders: Secondary | ICD-10-CM | POA: Diagnosis not present

## 2013-03-10 DIAGNOSIS — Z79899 Other long term (current) drug therapy: Secondary | ICD-10-CM | POA: Diagnosis not present

## 2013-03-10 DIAGNOSIS — H81399 Other peripheral vertigo, unspecified ear: Secondary | ICD-10-CM | POA: Diagnosis not present

## 2013-03-10 DIAGNOSIS — M542 Cervicalgia: Secondary | ICD-10-CM | POA: Diagnosis not present

## 2013-03-11 DIAGNOSIS — Z79899 Other long term (current) drug therapy: Secondary | ICD-10-CM | POA: Diagnosis not present

## 2013-03-11 DIAGNOSIS — M545 Low back pain: Secondary | ICD-10-CM | POA: Diagnosis not present

## 2013-03-14 DIAGNOSIS — IMO0001 Reserved for inherently not codable concepts without codable children: Secondary | ICD-10-CM | POA: Diagnosis not present

## 2013-03-14 DIAGNOSIS — M5030 Other cervical disc degeneration, unspecified cervical region: Secondary | ICD-10-CM | POA: Diagnosis not present

## 2013-03-17 DIAGNOSIS — G43109 Migraine with aura, not intractable, without status migrainosus: Secondary | ICD-10-CM | POA: Diagnosis not present

## 2013-03-22 DIAGNOSIS — IMO0001 Reserved for inherently not codable concepts without codable children: Secondary | ICD-10-CM | POA: Diagnosis not present

## 2013-03-22 DIAGNOSIS — M5030 Other cervical disc degeneration, unspecified cervical region: Secondary | ICD-10-CM | POA: Diagnosis not present

## 2013-03-28 DIAGNOSIS — L570 Actinic keratosis: Secondary | ICD-10-CM | POA: Diagnosis not present

## 2013-03-28 DIAGNOSIS — K2900 Acute gastritis without bleeding: Secondary | ICD-10-CM | POA: Diagnosis not present

## 2013-04-05 DIAGNOSIS — M5030 Other cervical disc degeneration, unspecified cervical region: Secondary | ICD-10-CM | POA: Diagnosis not present

## 2013-04-05 DIAGNOSIS — IMO0001 Reserved for inherently not codable concepts without codable children: Secondary | ICD-10-CM | POA: Diagnosis not present

## 2013-04-07 DIAGNOSIS — M5030 Other cervical disc degeneration, unspecified cervical region: Secondary | ICD-10-CM | POA: Diagnosis not present

## 2013-04-07 DIAGNOSIS — IMO0001 Reserved for inherently not codable concepts without codable children: Secondary | ICD-10-CM | POA: Diagnosis not present

## 2013-04-08 ENCOUNTER — Telehealth: Payer: Self-pay | Admitting: *Deleted

## 2013-04-08 NOTE — Telephone Encounter (Signed)
Called pt with no answer, no answering machine to leave a message.

## 2013-04-08 NOTE — Telephone Encounter (Signed)
Pt called stating that she woke up at 5 am with a ache in her chest and back and felt full like she had ate to much.   She also states that she had been to the [facility] and done a cycling class and a kettle bell class.  She had no other symptoms

## 2013-04-11 NOTE — Telephone Encounter (Signed)
Called pt., patient advise that her chest,back, and shoulders are hurting and this occurred all weekend.(mostly in back and shoulders) Pt states she just feels full. Pt denies numbness/dizziness/headache/swelling. Can not check BP due to no BP machine at home. Pt did work out at [employer]. Pt states it might be her gallbladder? Please advise.

## 2013-04-11 NOTE — Telephone Encounter (Signed)
Sounds like musculoskeletal and she will need to see PCP.

## 2013-04-11 NOTE — Telephone Encounter (Signed)
Spoke to pt and advise to be seen by PCP.

## 2013-04-14 DIAGNOSIS — H8190 Unspecified disorder of vestibular function, unspecified ear: Secondary | ICD-10-CM | POA: Diagnosis not present

## 2013-04-14 DIAGNOSIS — H81399 Other peripheral vertigo, unspecified ear: Secondary | ICD-10-CM | POA: Diagnosis not present

## 2013-04-14 DIAGNOSIS — G541 Lumbosacral plexus disorders: Secondary | ICD-10-CM | POA: Diagnosis not present

## 2013-04-14 DIAGNOSIS — Z79899 Other long term (current) drug therapy: Secondary | ICD-10-CM | POA: Diagnosis not present

## 2013-04-14 DIAGNOSIS — M542 Cervicalgia: Secondary | ICD-10-CM | POA: Diagnosis not present

## 2013-04-14 DIAGNOSIS — M545 Low back pain: Secondary | ICD-10-CM | POA: Diagnosis not present

## 2013-04-19 DIAGNOSIS — D1800 Hemangioma unspecified site: Secondary | ICD-10-CM | POA: Diagnosis not present

## 2013-04-19 DIAGNOSIS — L570 Actinic keratosis: Secondary | ICD-10-CM | POA: Diagnosis not present

## 2013-04-19 DIAGNOSIS — M5030 Other cervical disc degeneration, unspecified cervical region: Secondary | ICD-10-CM | POA: Diagnosis not present

## 2013-04-19 DIAGNOSIS — L819 Disorder of pigmentation, unspecified: Secondary | ICD-10-CM | POA: Diagnosis not present

## 2013-04-19 DIAGNOSIS — IMO0001 Reserved for inherently not codable concepts without codable children: Secondary | ICD-10-CM | POA: Diagnosis not present

## 2013-04-26 DIAGNOSIS — F329 Major depressive disorder, single episode, unspecified: Secondary | ICD-10-CM | POA: Diagnosis not present

## 2013-04-29 DIAGNOSIS — J069 Acute upper respiratory infection, unspecified: Secondary | ICD-10-CM | POA: Diagnosis not present

## 2013-05-10 DIAGNOSIS — Z0000 Encounter for general adult medical examination without abnormal findings: Secondary | ICD-10-CM | POA: Diagnosis not present

## 2013-05-10 DIAGNOSIS — M6789 Other specified disorders of synovium and tendon, multiple sites: Secondary | ICD-10-CM | POA: Diagnosis not present

## 2013-05-11 DIAGNOSIS — L570 Actinic keratosis: Secondary | ICD-10-CM | POA: Diagnosis not present

## 2013-05-11 DIAGNOSIS — Z136 Encounter for screening for cardiovascular disorders: Secondary | ICD-10-CM | POA: Diagnosis not present

## 2013-05-11 DIAGNOSIS — Z85828 Personal history of other malignant neoplasm of skin: Secondary | ICD-10-CM | POA: Diagnosis not present

## 2013-05-17 DIAGNOSIS — M542 Cervicalgia: Secondary | ICD-10-CM | POA: Diagnosis not present

## 2013-05-17 DIAGNOSIS — H8190 Unspecified disorder of vestibular function, unspecified ear: Secondary | ICD-10-CM | POA: Diagnosis not present

## 2013-05-17 DIAGNOSIS — H81399 Other peripheral vertigo, unspecified ear: Secondary | ICD-10-CM | POA: Diagnosis not present

## 2013-05-17 DIAGNOSIS — G541 Lumbosacral plexus disorders: Secondary | ICD-10-CM | POA: Diagnosis not present

## 2013-05-23 ENCOUNTER — Telehealth: Payer: Self-pay | Admitting: Adult Health

## 2013-05-23 NOTE — Telephone Encounter (Signed)
Has vaginal dryness, use luvena can try olive oil

## 2013-05-23 NOTE — Telephone Encounter (Signed)
Left message that I called.

## 2013-06-08 DIAGNOSIS — L570 Actinic keratosis: Secondary | ICD-10-CM | POA: Diagnosis not present

## 2013-06-14 DIAGNOSIS — M542 Cervicalgia: Secondary | ICD-10-CM | POA: Diagnosis not present

## 2013-06-14 DIAGNOSIS — H8190 Unspecified disorder of vestibular function, unspecified ear: Secondary | ICD-10-CM | POA: Diagnosis not present

## 2013-06-14 DIAGNOSIS — H81399 Other peripheral vertigo, unspecified ear: Secondary | ICD-10-CM | POA: Diagnosis not present

## 2013-06-14 DIAGNOSIS — Z79899 Other long term (current) drug therapy: Secondary | ICD-10-CM | POA: Diagnosis not present

## 2013-06-14 DIAGNOSIS — G541 Lumbosacral plexus disorders: Secondary | ICD-10-CM | POA: Diagnosis not present

## 2013-06-14 DIAGNOSIS — M545 Low back pain: Secondary | ICD-10-CM | POA: Diagnosis not present

## 2013-07-13 DIAGNOSIS — G541 Lumbosacral plexus disorders: Secondary | ICD-10-CM | POA: Diagnosis not present

## 2013-07-13 DIAGNOSIS — M542 Cervicalgia: Secondary | ICD-10-CM | POA: Diagnosis not present

## 2013-07-13 DIAGNOSIS — H81399 Other peripheral vertigo, unspecified ear: Secondary | ICD-10-CM | POA: Diagnosis not present

## 2013-07-13 DIAGNOSIS — H8190 Unspecified disorder of vestibular function, unspecified ear: Secondary | ICD-10-CM | POA: Diagnosis not present

## 2013-07-19 DIAGNOSIS — F339 Major depressive disorder, recurrent, unspecified: Secondary | ICD-10-CM | POA: Diagnosis not present

## 2013-07-21 ENCOUNTER — Encounter (INDEPENDENT_AMBULATORY_CARE_PROVIDER_SITE_OTHER): Payer: Self-pay | Admitting: *Deleted

## 2013-07-21 DIAGNOSIS — L570 Actinic keratosis: Secondary | ICD-10-CM | POA: Diagnosis not present

## 2013-07-21 DIAGNOSIS — M899 Disorder of bone, unspecified: Secondary | ICD-10-CM | POA: Diagnosis not present

## 2013-07-21 DIAGNOSIS — Z780 Asymptomatic menopausal state: Secondary | ICD-10-CM | POA: Diagnosis not present

## 2013-07-21 DIAGNOSIS — M810 Age-related osteoporosis without current pathological fracture: Secondary | ICD-10-CM | POA: Diagnosis not present

## 2013-07-25 DIAGNOSIS — D2330 Other benign neoplasm of skin of unspecified part of face: Secondary | ICD-10-CM | POA: Diagnosis not present

## 2013-07-26 DIAGNOSIS — I471 Supraventricular tachycardia: Secondary | ICD-10-CM | POA: Diagnosis not present

## 2013-07-26 DIAGNOSIS — L84 Corns and callosities: Secondary | ICD-10-CM | POA: Diagnosis not present

## 2013-08-09 DIAGNOSIS — J040 Acute laryngitis: Secondary | ICD-10-CM | POA: Diagnosis not present

## 2013-08-10 ENCOUNTER — Ambulatory Visit (INDEPENDENT_AMBULATORY_CARE_PROVIDER_SITE_OTHER): Payer: Medicare Other | Admitting: Internal Medicine

## 2013-08-10 DIAGNOSIS — M5030 Other cervical disc degeneration, unspecified cervical region: Secondary | ICD-10-CM | POA: Diagnosis not present

## 2013-08-10 DIAGNOSIS — S139XXA Sprain of joints and ligaments of unspecified parts of neck, initial encounter: Secondary | ICD-10-CM | POA: Diagnosis not present

## 2013-08-10 DIAGNOSIS — M542 Cervicalgia: Secondary | ICD-10-CM | POA: Diagnosis not present

## 2013-08-10 DIAGNOSIS — Z79899 Other long term (current) drug therapy: Secondary | ICD-10-CM | POA: Diagnosis not present

## 2013-08-10 DIAGNOSIS — M545 Low back pain: Secondary | ICD-10-CM | POA: Diagnosis not present

## 2013-08-11 ENCOUNTER — Ambulatory Visit (INDEPENDENT_AMBULATORY_CARE_PROVIDER_SITE_OTHER): Payer: Medicare Other | Admitting: Internal Medicine

## 2013-08-11 ENCOUNTER — Encounter (INDEPENDENT_AMBULATORY_CARE_PROVIDER_SITE_OTHER): Payer: Self-pay | Admitting: Internal Medicine

## 2013-08-11 VITALS — BP 112/66 | HR 60 | Temp 98.1°F | Ht 66.0 in | Wt 113.8 lb

## 2013-08-11 DIAGNOSIS — K219 Gastro-esophageal reflux disease without esophagitis: Secondary | ICD-10-CM | POA: Diagnosis not present

## 2013-08-11 NOTE — Progress Notes (Signed)
Subjective:     [name], female   DOB: [birth year], 57 y.o.   MRN: [id number]  HPI Here today for f/u. She tells me she is doing good. She does have a cold and is slightly hoarse. She took the flu shot 4 weeks at [facility]. Her acid reflux is better .She does not feel full. Her appetite is good. She has lost about 7 pounds since her last visit in August. She contributes this to having 2 part time jobs which is new. No abdominal pain. She has a BM daily or every other day.    Review of Systems see hpi Past Medical History  Diagnosis Date  . Chest pain   . Anxiety   . PSVT (paroxysmal supraventricular tachycardia)     Documented by event recorder  . Cervical spine disease     Mild  . Abnormal Pap smear   . Rectocele 03/03/2013  . H/O bilateral breast implants 03/03/2013   Current Outpatient Prescriptions on File Prior to Visit  Medication Sig Dispense Refill  . Multiple Vitamin (MULTIVITAMIN) tablet Take 1 tablet by mouth daily.         No current facility-administered medications on file prior to visit.   Past Surgical History  Procedure Laterality Date  . Breast enhancement surgery     Allergies  Allergen Reactions  . Penicillins         Objective:   Physical Exam Filed Vitals:   08/11/13 1034  BP: 112/66  Pulse: 60  Temp: 98.1 F (36.7 C)  Height: 5\' 6"  (1.676 m)  Weight: 113 lb 12.8 oz (51.619 kg)  Alert and oriented. Skin warm and dry. Oral mucosa is moist.   . Sclera anicteric, conjunctivae is pink. Thyroid not enlarged. No cervical lymphadenopathy. Lungs clear. Heart regular rate and rhythm.  Abdomen is soft. Bowel sounds are positive. No hepatomegaly. No abdominal masses felt. No tenderness.  No edema to lower extremities.      Assessment:    GERD controlled at this time.  No GI complaints    Plan:    OV in 1 yr. If you have any problems call our office.

## 2013-08-11 NOTE — Patient Instructions (Signed)
OV in 1 yr. 

## 2013-08-30 DIAGNOSIS — N3000 Acute cystitis without hematuria: Secondary | ICD-10-CM | POA: Diagnosis not present

## 2013-09-13 DIAGNOSIS — M542 Cervicalgia: Secondary | ICD-10-CM | POA: Diagnosis not present

## 2013-09-13 DIAGNOSIS — M5030 Other cervical disc degeneration, unspecified cervical region: Secondary | ICD-10-CM | POA: Diagnosis not present

## 2013-09-13 DIAGNOSIS — S139XXA Sprain of joints and ligaments of unspecified parts of neck, initial encounter: Secondary | ICD-10-CM | POA: Diagnosis not present

## 2013-09-15 DIAGNOSIS — L570 Actinic keratosis: Secondary | ICD-10-CM | POA: Diagnosis not present

## 2013-10-12 DIAGNOSIS — M545 Low back pain, unspecified: Secondary | ICD-10-CM | POA: Diagnosis not present

## 2013-10-12 DIAGNOSIS — M533 Sacrococcygeal disorders, not elsewhere classified: Secondary | ICD-10-CM | POA: Diagnosis not present

## 2013-10-14 DIAGNOSIS — F329 Major depressive disorder, single episode, unspecified: Secondary | ICD-10-CM | POA: Diagnosis not present

## 2013-10-14 DIAGNOSIS — F3289 Other specified depressive episodes: Secondary | ICD-10-CM | POA: Diagnosis not present

## 2013-10-17 DIAGNOSIS — B079 Viral wart, unspecified: Secondary | ICD-10-CM | POA: Diagnosis not present

## 2013-10-17 DIAGNOSIS — L259 Unspecified contact dermatitis, unspecified cause: Secondary | ICD-10-CM | POA: Diagnosis not present

## 2013-10-17 DIAGNOSIS — D485 Neoplasm of uncertain behavior of skin: Secondary | ICD-10-CM | POA: Diagnosis not present

## 2013-10-27 ENCOUNTER — Ambulatory Visit: Payer: Medicare Other | Admitting: Adult Health

## 2013-11-01 ENCOUNTER — Encounter: Payer: Self-pay | Admitting: Adult Health

## 2013-11-01 ENCOUNTER — Ambulatory Visit (INDEPENDENT_AMBULATORY_CARE_PROVIDER_SITE_OTHER): Payer: Medicare Other | Admitting: Adult Health

## 2013-11-01 ENCOUNTER — Encounter (INDEPENDENT_AMBULATORY_CARE_PROVIDER_SITE_OTHER): Payer: Self-pay

## 2013-11-01 VITALS — BP 120/80 | Ht 66.0 in | Wt 116.0 lb

## 2013-11-01 DIAGNOSIS — Z87440 Personal history of urinary (tract) infections: Secondary | ICD-10-CM

## 2013-11-01 DIAGNOSIS — R5381 Other malaise: Secondary | ICD-10-CM | POA: Diagnosis not present

## 2013-11-01 DIAGNOSIS — R634 Abnormal weight loss: Secondary | ICD-10-CM

## 2013-11-01 DIAGNOSIS — R5383 Other fatigue: Secondary | ICD-10-CM

## 2013-11-01 DIAGNOSIS — E7800 Pure hypercholesterolemia, unspecified: Secondary | ICD-10-CM | POA: Diagnosis not present

## 2013-11-01 LAB — POCT URINALYSIS DIPSTICK
Blood, UA: NEGATIVE
GLUCOSE UA: NEGATIVE
LEUKOCYTES UA: NEGATIVE
NITRITE UA: NEGATIVE
PROTEIN UA: NEGATIVE

## 2013-11-01 NOTE — Progress Notes (Signed)
Subjective:     Patient ID: ANISSA ABBS, female   DOB: 1956/07/16, 58 y.o.   MRN: [id number]  HPI Doranne is a 58 year old white female in complaining of having recent UTI and had reaction to septra ds, had hives, then rx'd cipro,phenergan,zyrtec and benadryl.She is also complaining of weight loss and fatigue.Says stomach feels funny at times.Wants labs.  Review of Systems See HPI Reviewed past medical,surgical, social and family history. Reviewed medications and allergies.     Objective:   Physical Exam BP 120/80  Ht 5\' 6"  (1.676 m)  Wt 116 lb (52.617 kg)  BMI 18.73 kg/m2urine dipstick neative   Skin warm and dry.Pelvic: external genitalia is normal in appearance, vagina: scant discharge without odor, cervix:smooth , uterus: normal size, shape and contour, non tender, no masses felt, adnexa: no masses or tenderness noted.Abdomen soft, non tender, no masses felt.  Assessment:     Recent UTI Fatigue  Weight loss History of elevated cholesterol     Plan:    Check CBC,CMP, TSH and lipids Take cipro, at least 2 more Review handout on UTI  Follow up labs in am

## 2013-11-01 NOTE — Patient Instructions (Signed)
Urinary Tract Infection Urinary tract infections (UTIs) can develop anywhere along your urinary tract. Your urinary tract is your body's drainage system for removing wastes and extra water. Your urinary tract includes two kidneys, two ureters, a bladder, and a urethra. Your kidneys are a pair of bean-shaped organs. Each kidney is about the size of your fist. They are located below your ribs, one on each side of your spine. CAUSES Infections are caused by microbes, which are microscopic organisms, including fungi, viruses, and bacteria. These organisms are so small that they can only be seen through a microscope. Bacteria are the microbes that most commonly cause UTIs. SYMPTOMS  Symptoms of UTIs may vary by age and gender of the patient and by the location of the infection. Symptoms in young women typically include a frequent and intense urge to urinate and a painful, burning feeling in the bladder or urethra during urination. Older women and men are more likely to be tired, shaky, and weak and have muscle aches and abdominal pain. A fever may mean the infection is in your kidneys. Other symptoms of a kidney infection include pain in your back or sides below the ribs, nausea, and vomiting. DIAGNOSIS To diagnose a UTI, your caregiver will ask you about your symptoms. Your caregiver also will ask to provide a urine sample. The urine sample will be tested for bacteria and white blood cells. White blood cells are made by your body to help fight infection. TREATMENT  Typically, UTIs can be treated with medication. Because most UTIs are caused by a bacterial infection, they usually can be treated with the use of antibiotics. The choice of antibiotic and length of treatment depend on your symptoms and the type of bacteria causing your infection. HOME CARE INSTRUCTIONS  If you were prescribed antibiotics, take them exactly as your caregiver instructs you. Finish the medication even if you feel better after you  have only taken some of the medication.  Drink enough water and fluids to keep your urine clear or pale yellow.  Avoid caffeine, tea, and carbonated beverages. They tend to irritate your bladder.  Empty your bladder often. Avoid holding urine for long periods of time.  Empty your bladder before and after sexual intercourse.  After a bowel movement, women should cleanse from front to back. Use each tissue only once. SEEK MEDICAL CARE IF:   You have back pain.  You develop a fever.  Your symptoms do not begin to resolve within 3 days. SEEK IMMEDIATE MEDICAL CARE IF:   You have severe back pain or lower abdominal pain.  You develop chills.  You have nausea or vomiting.  You have continued burning or discomfort with urination. MAKE SURE YOU:   Understand these instructions.  Will watch your condition.  Will get help right away if you are not doing well or get worse. Document Released: 05/28/2005 Document Revised: 02/17/2012 Document Reviewed: 09/26/2011 [facility] Patient Information 2014 [place]. Take 2 more cipro Push fluids Call for lab

## 2013-11-02 ENCOUNTER — Telehealth: Payer: Self-pay | Admitting: Adult Health

## 2013-11-02 LAB — COMPREHENSIVE METABOLIC PANEL
ALT: 19 U/L (ref 0–35)
AST: 20 U/L (ref 0–37)
Albumin: 4.1 g/dL (ref 3.5–5.2)
Alkaline Phosphatase: 67 U/L (ref 39–117)
BUN: 22 mg/dL (ref 6–23)
CALCIUM: 9.4 mg/dL (ref 8.4–10.5)
CHLORIDE: 102 meq/L (ref 96–112)
CO2: 31 mEq/L (ref 19–32)
CREATININE: 0.61 mg/dL (ref 0.50–1.10)
Glucose, Bld: 94 mg/dL (ref 70–99)
POTASSIUM: 4.7 meq/L (ref 3.5–5.3)
SODIUM: 141 meq/L (ref 135–145)
TOTAL PROTEIN: 6.4 g/dL (ref 6.0–8.3)
Total Bilirubin: 0.4 mg/dL (ref 0.2–1.2)

## 2013-11-02 LAB — LIPID PANEL
Cholesterol: 150 mg/dL (ref 0–200)
HDL: 51 mg/dL (ref >39)
LDL CALC: 86 mg/dL (ref 0–99)
Total CHOL/HDL Ratio: 2.9 Ratio
Triglycerides: 63 mg/dL (ref <150)
VLDL: 13 mg/dL (ref 0–40)

## 2013-11-02 LAB — CBC
HCT: 38.4 % (ref 36.0–46.0)
Hemoglobin: 12.9 g/dL (ref 12.0–15.0)
MCH: 31.8 pg (ref 26.0–34.0)
MCHC: 33.6 g/dL (ref 30.0–36.0)
MCV: 94.6 fL (ref 78.0–100.0)
PLATELETS: 225 10*3/uL (ref 150–400)
RBC: 4.06 MIL/uL (ref 3.87–5.11)
RDW: 12.8 % (ref 11.5–15.5)
WBC: 4.3 10*3/uL (ref 4.0–10.5)

## 2013-11-02 LAB — TSH: TSH: 0.876 u[IU]/mL (ref 0.350–4.500)

## 2013-11-02 NOTE — Telephone Encounter (Signed)
Pt aware labs are all normal  

## 2013-11-04 DIAGNOSIS — Z1231 Encounter for screening mammogram for malignant neoplasm of breast: Secondary | ICD-10-CM | POA: Diagnosis not present

## 2013-11-04 DIAGNOSIS — Z978 Presence of other specified devices: Secondary | ICD-10-CM | POA: Diagnosis not present

## 2013-11-09 DIAGNOSIS — M546 Pain in thoracic spine: Secondary | ICD-10-CM | POA: Diagnosis not present

## 2013-11-09 DIAGNOSIS — Z79899 Other long term (current) drug therapy: Secondary | ICD-10-CM | POA: Diagnosis not present

## 2013-11-09 DIAGNOSIS — M5430 Sciatica, unspecified side: Secondary | ICD-10-CM | POA: Diagnosis not present

## 2013-11-09 DIAGNOSIS — M542 Cervicalgia: Secondary | ICD-10-CM | POA: Diagnosis not present

## 2013-11-09 DIAGNOSIS — M545 Low back pain, unspecified: Secondary | ICD-10-CM | POA: Diagnosis not present

## 2013-11-09 DIAGNOSIS — M5412 Radiculopathy, cervical region: Secondary | ICD-10-CM | POA: Diagnosis not present

## 2013-11-16 DIAGNOSIS — D485 Neoplasm of uncertain behavior of skin: Secondary | ICD-10-CM | POA: Diagnosis not present

## 2013-11-16 DIAGNOSIS — L570 Actinic keratosis: Secondary | ICD-10-CM | POA: Diagnosis not present

## 2013-11-21 DIAGNOSIS — M6740 Ganglion, unspecified site: Secondary | ICD-10-CM | POA: Diagnosis not present

## 2013-11-23 ENCOUNTER — Telehealth: Payer: Self-pay | Admitting: Adult Health

## 2013-11-23 NOTE — Telephone Encounter (Signed)
Pt states would like to talk to Derrek Monaco, NP about a few concerns. Pt spoke with Derrek Monaco, NP and is to keep her appt for 12/06/2013.

## 2013-12-06 ENCOUNTER — Ambulatory Visit (INDEPENDENT_AMBULATORY_CARE_PROVIDER_SITE_OTHER): Payer: Medicare Other | Admitting: Adult Health

## 2013-12-06 ENCOUNTER — Encounter: Payer: Self-pay | Admitting: Adult Health

## 2013-12-06 VITALS — BP 130/74 | Ht 66.0 in | Wt 117.0 lb

## 2013-12-06 DIAGNOSIS — N951 Menopausal and female climacteric states: Secondary | ICD-10-CM

## 2013-12-06 DIAGNOSIS — M6740 Ganglion, unspecified site: Secondary | ICD-10-CM

## 2013-12-06 DIAGNOSIS — M67472 Ganglion, left ankle and foot: Secondary | ICD-10-CM

## 2013-12-06 DIAGNOSIS — R232 Flushing: Secondary | ICD-10-CM

## 2013-12-06 HISTORY — DX: Ganglion, left ankle and foot: M67.472

## 2013-12-06 HISTORY — DX: Flushing: R23.2

## 2013-12-06 HISTORY — DX: Menopausal and female climacteric states: N95.1

## 2013-12-06 NOTE — Patient Instructions (Signed)
Ganglion Cyst A ganglion cyst is a noncancerous, fluid-filled lump that occurs near joints or tendons. The ganglion cyst grows out of a joint or the lining of a tendon. It most often develops in the hand or wrist but can also develop in the shoulder, elbow, hip, knee, ankle, or foot. The round or oval ganglion can be pea sized or larger than a grape. Increased activity may enlarge the size of the cyst because more fluid starts to build up.  CAUSES  It is not completely known what causes a ganglion cyst to grow. However, it may be related to:  Inflammation or irritation around the joint.  An injury.  Repetitive movements or overuse.  Arthritis. SYMPTOMS  A lump most often appears in the hand or wrist, but can occur in other areas of the body. Generally, the lump is painless without other symptoms. However, sometimes pain can be felt during activity or when pressure is applied to the lump. The lump may even be tender to the touch. Tingling, pain, numbness, or muscle weakness can occur if the ganglion cyst presses on a nerve. Your grip may be weak and you may have less movement in your joints.  DIAGNOSIS  Ganglion cysts are most often diagnosed based on a physical exam, noting where the cyst is and how it looks. Your caregiver will feel the lump and may shine a light alongside it. If it is a ganglion, a light often shines through it. Your caregiver may order an X-ray, ultrasound, or MRI to rule out other conditions. TREATMENT  Ganglions usually go away on their own without treatment. If pain or other symptoms are involved, treatment may be needed. Treatment is also needed if the ganglion limits your movement or if it gets infected. Treatment options include:  Wearing a wrist or finger brace or splint.  Taking anti-inflammatory medicine.  Draining fluid from the lump with a needle (aspiration).  Injecting a steroid into the joint.  Surgery to remove the ganglion cyst and its stalk that is  attached to the joint or tendon. However, ganglion cysts can grow back. HOME CARE INSTRUCTIONS   Do not press on the ganglion, poke it with a needle, or hit it with a heavy object. You may rub the lump gently and often. Sometimes fluid moves out of the cyst.  Only take medicines as directed by your caregiver.  Wear your brace or splint as directed by your caregiver. SEEK MEDICAL CARE IF:   Your ganglion becomes larger or more painful.  You have increased redness, red streaks, or swelling.  You have pus coming from the lump.  You have weakness or numbness in the affected area. MAKE SURE YOU:   Understand these instructions.  Will watch your condition.  Will get help right away if you are not doing well or get worse. Document Released: 08/15/2000 Document Revised: 05/12/2012 Document Reviewed: 10/12/2007 [facility] Patient Information 2014 [place]. Menopause Menopause is the normal time of life when menstrual periods stop completely. Menopause is complete when you have missed 12 consecutive menstrual periods. It usually occurs between the ages of 13 years and 87 years. Very rarely does a woman develop menopause before the age of 40 years. At menopause, your ovaries stop producing the female hormones estrogen and progesterone. This can cause undesirable symptoms and also affect your health. Sometimes the symptoms may occur 4 5 years before the menopause begins. There is no relationship between menopause and:  Oral contraceptives.  Number of children you had.  Race.  The age your menstrual periods started (menarche). Heavy smokers and very thin women may develop menopause earlier in life. CAUSES  The ovaries stop producing the female hormones estrogen and progesterone.  Other causes include:  Surgery to remove both ovaries.  The ovaries stop functioning for no known reason.  Tumors of the pituitary gland in the brain.  Medical disease that affects the ovaries and  hormone production.  Radiation treatment to the abdomen or pelvis.  Chemotherapy that affects the ovaries. SYMPTOMS   Hot flashes.  Night sweats.  Decrease in sex drive.  Vaginal dryness and thinning of the vagina causing painful intercourse.  Dryness of the skin and developing wrinkles.  Headaches.  Tiredness.  Irritability.  Memory problems.  Weight gain.  Bladder infections.  Hair growth of the face and chest.  Infertility. More serious symptoms include:  Loss of bone (osteoporosis) causing breaks (fractures).  Depression.  Hardening and narrowing of the arteries (atherosclerosis) causing heart attacks and strokes. DIAGNOSIS   When the menstrual periods have stopped for 12 straight months.  Physical exam.  Hormone studies of the blood. TREATMENT  There are many treatment choices and nearly as many questions about them. The decisions to treat or not to treat menopausal changes is an individual choice made with your health care provider. Your health care provider can discuss the treatments with you. Together, you can decide which treatment will work best for you. Your treatment choices may include:   Hormone therapy (estrogen and progesterone).  Non-hormonal medicines.  Treating the individual symptoms with medicine (for example antidepressants for depression).  Herbal medicines that may help specific symptoms.  Counseling by a psychiatrist or psychologist.  Group therapy.  Lifestyle changes including:  Eating healthy.  Regular exercise.  Limiting caffeine and alcohol.  Stress management and meditation.  No treatment. HOME CARE INSTRUCTIONS   Take the medicine your health care provider gives you as directed.  Get plenty of sleep and rest.  Exercise regularly.  Eat a diet that contains calcium (good for the bones) and soy products (acts like estrogen hormone).  Avoid alcoholic beverages.  Do not smoke.  If you have hot flashes,  dress in layers.  Take supplements, calcium, and vitamin D to strengthen bones.  You can use over-the-counter lubricants or moisturizers for vaginal dryness.  Group therapy is sometimes very helpful.  Acupuncture may be helpful in some cases. SEEK MEDICAL CARE IF:   You are not sure you are in menopause.  You are having menopausal symptoms and need advice and treatment.  You are still having menstrual periods after age 4 years.  You have pain with intercourse.  Menopause is complete (no menstrual period for 12 months) and you develop vaginal bleeding.  You need a referral to a specialist (gynecologist, psychiatrist, or psychologist) for treatment. SEEK IMMEDIATE MEDICAL CARE IF:   You have severe depression.  You have excessive vaginal bleeding.  You fell and think you have a broken bone.  You have pain when you urinate.  You develop leg or chest pain.  You have a fast pounding heart beat (palpitations).  You have severe headaches.  You develop vision problems.  You feel a lump in your breast.  You have abdominal pain or severe indigestion. Document Released: 11/08/2003 Document Revised: 04/20/2013 Document Reviewed: 03/17/2013 [facility] Patient Information 2014 [place], [place]. See Dr Aline Brochure

## 2013-12-06 NOTE — Progress Notes (Signed)
Subjective:     [name], female   DOB: 1955-09-10, 58 y.o.   MRN: [id number]  HPI April Knox is a 58 year old white female in complaining of menopausal symptoms, including some hot flashes, vaginal dryness, fatigue and decrease libido.She works long hours.She also complains of knot left foot.Had normal labs in March.Has family stress with youngest daughter that just got married and is not happy.  Review of Systems See HPI Reviewed past medical,surgical, social and family history. Reviewed medications and allergies.     Objective:   Physical Exam BP 130/74  Ht 5\' 6"  (1.676 m)  Wt 117 lb (53.071 kg)  BMI 18.89 kg/m2   Ganglion cyst left foot that is round, and mobile, non tneder, told to see Dr Aline Brochure and number given, discussed pros and cons of HRT, vaginal estrogen or SSRI and she declines all  Assessment:     Menopausal symptoms Vaginal dryness Decrease libido Hot flashes Ganglion cyst left foot    Plan:    Try luvena again Take 600 mg calcium daily and vitamin D3 2000 iu per day if desired See Dr Aline Brochure   Review handouts on ganglion cyst, menopause  If desires therapy for menopause call back

## 2013-12-14 DIAGNOSIS — M545 Low back pain, unspecified: Secondary | ICD-10-CM | POA: Diagnosis not present

## 2013-12-14 DIAGNOSIS — M5412 Radiculopathy, cervical region: Secondary | ICD-10-CM | POA: Diagnosis not present

## 2013-12-14 DIAGNOSIS — M542 Cervicalgia: Secondary | ICD-10-CM | POA: Diagnosis not present

## 2013-12-14 DIAGNOSIS — M546 Pain in thoracic spine: Secondary | ICD-10-CM | POA: Diagnosis not present

## 2013-12-14 DIAGNOSIS — Z79899 Other long term (current) drug therapy: Secondary | ICD-10-CM | POA: Diagnosis not present

## 2013-12-20 DIAGNOSIS — M79609 Pain in unspecified limb: Secondary | ICD-10-CM | POA: Diagnosis not present

## 2013-12-20 DIAGNOSIS — Z85828 Personal history of other malignant neoplasm of skin: Secondary | ICD-10-CM | POA: Diagnosis not present

## 2013-12-20 DIAGNOSIS — M6740 Ganglion, unspecified site: Secondary | ICD-10-CM | POA: Diagnosis not present

## 2013-12-20 DIAGNOSIS — B351 Tinea unguium: Secondary | ICD-10-CM | POA: Diagnosis not present

## 2013-12-20 DIAGNOSIS — L570 Actinic keratosis: Secondary | ICD-10-CM | POA: Diagnosis not present

## 2014-01-06 DIAGNOSIS — F329 Major depressive disorder, single episode, unspecified: Secondary | ICD-10-CM | POA: Diagnosis not present

## 2014-01-06 DIAGNOSIS — F3289 Other specified depressive episodes: Secondary | ICD-10-CM | POA: Diagnosis not present

## 2014-01-11 DIAGNOSIS — M546 Pain in thoracic spine: Secondary | ICD-10-CM | POA: Diagnosis not present

## 2014-01-11 DIAGNOSIS — M542 Cervicalgia: Secondary | ICD-10-CM | POA: Diagnosis not present

## 2014-01-11 DIAGNOSIS — M5412 Radiculopathy, cervical region: Secondary | ICD-10-CM | POA: Diagnosis not present

## 2014-01-18 DIAGNOSIS — Z85828 Personal history of other malignant neoplasm of skin: Secondary | ICD-10-CM | POA: Diagnosis not present

## 2014-01-18 DIAGNOSIS — L570 Actinic keratosis: Secondary | ICD-10-CM | POA: Diagnosis not present

## 2014-01-19 ENCOUNTER — Encounter: Payer: Self-pay | Admitting: Cardiology

## 2014-01-19 ENCOUNTER — Ambulatory Visit (INDEPENDENT_AMBULATORY_CARE_PROVIDER_SITE_OTHER): Payer: Medicare Other | Admitting: Cardiology

## 2014-01-19 VITALS — BP 128/72 | HR 57 | Ht 66.0 in | Wt 120.0 lb

## 2014-01-19 DIAGNOSIS — I471 Supraventricular tachycardia: Secondary | ICD-10-CM

## 2014-01-19 NOTE — Progress Notes (Signed)
Clinical Summary Ms. Brannum is a 58 y.o.female last seen by NP Purcell Nails, this is our first visit together. She is seen for the following medical problems.  1. PSVT - history is unclear from available notes, prior notes mention PSVT documented on event recorder however do not see monitor report in our system EKGs reviewed, show normal sinus rhythm  - reports one episode a few weeks ago of heart racing, lasted for approx just a few minutes. None since that time.  - minimal caffeine. No EtoH  Past Medical History  Diagnosis Date  . Chest pain   . Anxiety   . PSVT (paroxysmal supraventricular tachycardia)     Documented by event recorder  . Cervical spine disease     Mild  . Abnormal Pap smear   . Rectocele 03/03/2013  . H/O bilateral breast implants 03/03/2013  . Elevated cholesterol   . Vaginal dryness, menopausal 12/06/2013  . Hot flashes 12/06/2013  . Menopausal symptoms 12/06/2013  . Ganglion cyst of left foot 12/06/2013     Allergies  Allergen Reactions  . Penicillins   . Sulfa Antibiotics Hives, Diarrhea, Nausea Only and Rash     Current Outpatient Prescriptions  Medication Sig Dispense Refill  . Multiple Vitamin (MULTIVITAMIN) tablet Take 1 tablet by mouth daily.         No current facility-administered medications for this visit.     Past Surgical History  Procedure Laterality Date  . Breast enhancement surgery       Allergies  Allergen Reactions  . Penicillins   . Sulfa Antibiotics Hives, Diarrhea, Nausea Only and Rash      Family History  Problem Relation Age of Onset  . Dementia Father   . Cancer Mother   . Healthy Daughter   . Healthy Daughter   . Healthy Daughter      Social History Ms. Borger reports that she has never smoked. She has never used smokeless tobacco. Ms. Olson reports that she does not drink alcohol.   Review of Systems CONSTITUTIONAL: No weight loss, fever, chills, weakness or fatigue.  HEENT: Eyes: No visual loss,  blurred vision, double vision or yellow sclerae.No hearing loss, sneezing, congestion, runny nose or sore throat.  SKIN: No rash or itching.  CARDIOVASCULAR: per HPI RESPIRATORY: No shortness of breath, cough or sputum.  GASTROINTESTINAL: No anorexia, nausea, vomiting or diarrhea. No abdominal pain or blood.  GENITOURINARY: No burning on urination, no polyuria NEUROLOGICAL: No headache, dizziness, syncope, paralysis, ataxia, numbness or tingling in the extremities. No change in bowel or bladder control.  MUSCULOSKELETAL: No muscle, back pain, joint pain or stiffness.  LYMPHATICS: No enlarged nodes. No history of splenectomy.  PSYCHIATRIC: No history of depression or anxiety.  ENDOCRINOLOGIC: No reports of sweating, cold or heat intolerance. No polyuria or polydipsia.  Marland Kitchen   Physical Examination p 57 bp 128/72 Wt 120 lbs BMI 19 Gen: resting comfortably, no acute distress HEENT: no scleral icterus, pupils equal round and reactive, no palptable cervical adenopathy,  CV: RRR, no m/r/g, no JVD, no carotid bruits Resp: Clear to auscultation bilaterally GI: abdomen is soft, non-tender, non-distended, normal bowel sounds, no hepatosplenomegaly MSK: extremities are warm, no edema.  Skin: warm, no rash Neuro:  no focal deficits Psych: appropriate affect   Diagnostic Studies 01/19/14 Clinic EKG NSR    Assessment and Plan  1. PSVT - fairly rare episodes of symptoms, do not last long. Will continue to follow clinically for now, if increase in frequency  or severity can consider AV nodal agent at that time.   F/u 1 year      Arnoldo Lenis, M.D., F.A.C.C.

## 2014-01-19 NOTE — Patient Instructions (Signed)
Your physician recommends that you schedule a follow-up appointment in: 1 year with Dr. Harl Bowie. You will receive a reminder letter two months in advance reminding you to call and schedule your appointment. If you don't receive this letter, please contact our office.

## 2014-01-26 ENCOUNTER — Telehealth: Payer: Self-pay | Admitting: *Deleted

## 2014-01-26 DIAGNOSIS — L608 Other nail disorders: Secondary | ICD-10-CM | POA: Diagnosis not present

## 2014-01-26 DIAGNOSIS — N3000 Acute cystitis without hematuria: Secondary | ICD-10-CM | POA: Diagnosis not present

## 2014-01-26 NOTE — Telephone Encounter (Signed)
Spoke with pt. Pt has a vaginal discharge with a little odor. Advised would need to be seen. Call transferred to front desk for appt. April Knox

## 2014-01-27 ENCOUNTER — Telehealth: Payer: Self-pay | Admitting: *Deleted

## 2014-01-27 NOTE — Telephone Encounter (Signed)
Pt states was seen yesterday at Dr. Sherrie Sport office was given Cipro for UTI, took this am now having a sharp pain in shoulder blade. Pt states "do you think I am having a heart attack?" No c/o shortness of breath, chest pain, numbness or tingling in extremities." Pt informed to go to ER for evaluation. Pt verbalized understanding.

## 2014-01-30 ENCOUNTER — Encounter: Payer: Self-pay | Admitting: Adult Health

## 2014-01-30 ENCOUNTER — Ambulatory Visit (INDEPENDENT_AMBULATORY_CARE_PROVIDER_SITE_OTHER): Payer: Medicare Other | Admitting: Adult Health

## 2014-01-30 ENCOUNTER — Ambulatory Visit: Payer: Medicare Other | Admitting: Adult Health

## 2014-01-30 VITALS — BP 90/60 | Ht 66.0 in | Wt 119.0 lb

## 2014-01-30 DIAGNOSIS — B351 Tinea unguium: Secondary | ICD-10-CM | POA: Diagnosis not present

## 2014-01-30 DIAGNOSIS — Z87440 Personal history of urinary (tract) infections: Secondary | ICD-10-CM

## 2014-01-30 DIAGNOSIS — M79609 Pain in unspecified limb: Secondary | ICD-10-CM | POA: Diagnosis not present

## 2014-01-30 DIAGNOSIS — K219 Gastro-esophageal reflux disease without esophagitis: Secondary | ICD-10-CM

## 2014-01-30 LAB — POCT URINALYSIS DIPSTICK
Blood, UA: NEGATIVE
Glucose, UA: NEGATIVE
LEUKOCYTES UA: NEGATIVE
NITRITE UA: NEGATIVE
PROTEIN UA: NEGATIVE

## 2014-01-30 MED ORDER — CETIRIZINE HCL 10 MG PO TABS: 10.0000 mg | ORAL_TABLET | Freq: Every day | ORAL | Status: DC

## 2014-01-30 MED ORDER — OMEPRAZOLE 20 MG PO CPDR: 20.0000 mg | DELAYED_RELEASE_CAPSULE | Freq: Every day | ORAL | Status: DC

## 2014-01-30 NOTE — Progress Notes (Signed)
Subjective:     [name], female   DOB: [birth year], 58 y.o.   MRN: [id number]  HPI April Knox is a 58 year old white female, widowed in coming of recent UTI was given cipro and had hives, itching and felt bad, has had full sensation when eats, but is eating small frequent meals, has had GERD in past.  Review of Systems See HPI Reviewed past medical,surgical, social and family history. Reviewed medications and allergies.     Objective:   Physical Exam BP 90/60  Ht 5\' 6"  (1.676 m)  Wt 119 lb (53.978 kg)  BMI 19.22 kg/m2urine negative, Skin warm and dry.Pelvic: external genitalia is normal in appearance, vagina:has decrease color, moisture and rugae cervix:smooth and atrophic, uterus: normal size, shape and contour, non tender, no masses felt, adnexa: no masses or tenderness noted.     Assessment:     History of UTI GERD    Plan:    Refilled zyrtec 10 mg 1 daily x 1 year Rx prilosec 20 mg 1 daily with 11 refills Increase water, eat small meals Follow up prn

## 2014-01-30 NOTE — Patient Instructions (Addendum)
Increase fluids,eat small frequent meals Follow up prn Gastroesophageal Reflux Disease, Adult Gastroesophageal reflux disease (GERD) happens when acid from your stomach flows up into the esophagus. When acid comes in contact with the esophagus, the acid causes soreness (inflammation) in the esophagus. Over time, GERD may create small holes (ulcers) in the lining of the esophagus. CAUSES   Increased body weight. This puts pressure on the stomach, making acid rise from the stomach into the esophagus.  Smoking. This increases acid production in the stomach.  Drinking alcohol. This causes decreased pressure in the lower esophageal sphincter (valve or ring of muscle between the esophagus and stomach), allowing acid from the stomach into the esophagus.  Late evening meals and a full stomach. This increases pressure and acid production in the stomach.  A malformed lower esophageal sphincter. Sometimes, no cause is found. SYMPTOMS   Burning pain in the lower part of the mid-chest behind the breastbone and in the mid-stomach area. This may occur twice a week or more often.  Trouble swallowing.  Sore throat.  Dry cough.  Asthma-like symptoms including chest tightness, shortness of breath, or wheezing. DIAGNOSIS  Your caregiver may be able to diagnose GERD based on your symptoms. In some cases, X-rays and other tests may be done to check for complications or to check the condition of your stomach and esophagus. TREATMENT  Your caregiver may recommend over-the-counter or prescription medicines to help decrease acid production. Ask your caregiver before starting or adding any new medicines.  HOME CARE INSTRUCTIONS   Change the factors that you can control. Ask your caregiver for guidance concerning weight loss, quitting smoking, and alcohol consumption.  Avoid foods and drinks that make your symptoms worse, such as:  Caffeine or alcoholic drinks.  Chocolate.  Peppermint or mint  flavorings.  Garlic and onions.  Spicy foods.  Citrus fruits, such as oranges, lemons, or limes.  Tomato-based foods such as sauce, chili, salsa, and pizza.  Fried and fatty foods.  Avoid lying down for the 3 hours prior to your bedtime or prior to taking a nap.  Eat small, frequent meals instead of large meals.  Wear loose-fitting clothing. Do not wear anything tight around your waist that causes pressure on your stomach.  Raise the head of your bed 6 to 8 inches with wood blocks to help you sleep. Extra pillows will not help.  Only take over-the-counter or prescription medicines for pain, discomfort, or fever as directed by your caregiver.  Do not take aspirin, ibuprofen, or other nonsteroidal anti-inflammatory drugs (NSAIDs). SEEK IMMEDIATE MEDICAL CARE IF:   You have pain in your arms, neck, jaw, teeth, or back.  Your pain increases or changes in intensity or duration.  You develop nausea, vomiting, or sweating (diaphoresis).  You develop shortness of breath, or you faint.  Your vomit is green, yellow, black, or looks like coffee grounds or blood.  Your stool is red, bloody, or black. These symptoms could be signs of other problems, such as heart disease, gastric bleeding, or esophageal bleeding. MAKE SURE YOU:   Understand these instructions.  Will watch your condition.  Will get help right away if you are not doing well or get worse. Document Released: 05/28/2005 Document Revised: 11/10/2011 Document Reviewed: 03/07/2011 [facility] Patient Information 2014 [place], [place].

## 2014-01-31 ENCOUNTER — Telehealth: Payer: Self-pay | Admitting: Adult Health

## 2014-01-31 NOTE — Telephone Encounter (Signed)
Complains of hives, try zantac with her zyrtec or benadryl

## 2014-02-09 ENCOUNTER — Telehealth: Payer: Self-pay | Admitting: Obstetrics and Gynecology

## 2014-02-09 NOTE — Telephone Encounter (Signed)
Left message to call be back in am

## 2014-02-10 ENCOUNTER — Telehealth: Payer: Self-pay | Admitting: Adult Health

## 2014-02-10 NOTE — Telephone Encounter (Signed)
Complains of irritation and redness around toenails after using polish for toenails, call PCP as dermatologist is out of town

## 2014-02-11 DIAGNOSIS — Z79899 Other long term (current) drug therapy: Secondary | ICD-10-CM | POA: Diagnosis not present

## 2014-02-11 DIAGNOSIS — L02619 Cutaneous abscess of unspecified foot: Secondary | ICD-10-CM | POA: Diagnosis not present

## 2014-02-13 DIAGNOSIS — M545 Low back pain, unspecified: Secondary | ICD-10-CM | POA: Diagnosis not present

## 2014-02-13 DIAGNOSIS — M5412 Radiculopathy, cervical region: Secondary | ICD-10-CM | POA: Diagnosis not present

## 2014-02-13 DIAGNOSIS — Z79899 Other long term (current) drug therapy: Secondary | ICD-10-CM | POA: Diagnosis not present

## 2014-02-13 DIAGNOSIS — M5430 Sciatica, unspecified side: Secondary | ICD-10-CM | POA: Diagnosis not present

## 2014-02-13 DIAGNOSIS — G894 Chronic pain syndrome: Secondary | ICD-10-CM | POA: Diagnosis not present

## 2014-02-13 DIAGNOSIS — M533 Sacrococcygeal disorders, not elsewhere classified: Secondary | ICD-10-CM | POA: Diagnosis not present

## 2014-02-13 DIAGNOSIS — IMO0002 Reserved for concepts with insufficient information to code with codable children: Secondary | ICD-10-CM | POA: Diagnosis not present

## 2014-02-13 DIAGNOSIS — G540 Brachial plexus disorders: Secondary | ICD-10-CM | POA: Diagnosis not present

## 2014-02-14 DIAGNOSIS — L02619 Cutaneous abscess of unspecified foot: Secondary | ICD-10-CM | POA: Diagnosis not present

## 2014-02-14 DIAGNOSIS — L0100 Impetigo, unspecified: Secondary | ICD-10-CM | POA: Diagnosis not present

## 2014-02-14 DIAGNOSIS — L03039 Cellulitis of unspecified toe: Secondary | ICD-10-CM | POA: Diagnosis not present

## 2014-02-16 DIAGNOSIS — M549 Dorsalgia, unspecified: Secondary | ICD-10-CM | POA: Diagnosis not present

## 2014-02-16 DIAGNOSIS — K219 Gastro-esophageal reflux disease without esophagitis: Secondary | ICD-10-CM | POA: Diagnosis not present

## 2014-02-23 ENCOUNTER — Ambulatory Visit: Payer: Medicare Other | Admitting: Adult Health

## 2014-02-23 ENCOUNTER — Telehealth: Payer: Self-pay | Admitting: Obstetrics and Gynecology

## 2014-02-23 DIAGNOSIS — B351 Tinea unguium: Secondary | ICD-10-CM | POA: Diagnosis not present

## 2014-02-23 NOTE — Telephone Encounter (Signed)
Spoke with pt. Pt is having a vaginal discharge. No odor. Pt states she has been on Keflex x 9 days. Advised would need to be seen. Call transferred to front desk for appt. [place]

## 2014-02-24 ENCOUNTER — Ambulatory Visit (INDEPENDENT_AMBULATORY_CARE_PROVIDER_SITE_OTHER): Payer: Medicare Other | Admitting: Adult Health

## 2014-02-24 ENCOUNTER — Encounter: Payer: Self-pay | Admitting: Adult Health

## 2014-02-24 VITALS — BP 128/78 | Ht 66.0 in | Wt 119.0 lb

## 2014-02-24 DIAGNOSIS — N898 Other specified noninflammatory disorders of vagina: Secondary | ICD-10-CM | POA: Insufficient documentation

## 2014-02-24 DIAGNOSIS — B379 Candidiasis, unspecified: Secondary | ICD-10-CM | POA: Insufficient documentation

## 2014-02-24 HISTORY — DX: Other specified noninflammatory disorders of vagina: N89.8

## 2014-02-24 HISTORY — DX: Candidiasis, unspecified: B37.9

## 2014-02-24 LAB — POCT WET PREP (WET MOUNT): WBC WET PREP: POSITIVE

## 2014-02-24 MED ORDER — TERCONAZOLE 0.4 % VA CREA: 1.0000 | TOPICAL_CREAM | Freq: Every day | VAGINAL | Status: DC

## 2014-02-24 NOTE — Progress Notes (Signed)
Subjective:     [name], female   DOB: May 10, 1956, 58 y.o.   MRN: [id number]  HPI April Knox is a 58 year old white female, widowed in complaining of discharge,no itching or burning, is on keflex and is supposed to start Lamisil and jublia for toenails and is nervous about it due to having to check liver function.Has tightness near left breast, no nausea or weakness.  Review of Systems See HPI Reviewed past medical,surgical, social and family history. Reviewed medications and allergies.     Objective:   Physical Exam BP 128/78  Ht 5\' 6"  (1.676 m)  Wt 119 lb (53.978 kg)  BMI 19.22 kg/m2   Skin warm and dry.  Lungs: clear to ausculation bilaterally. Cardiovascular: regular rate and rhythm.Pelvic: external genitalia is normal in appearance for age, vagina: white to yellow  discharge without odor, has loss of color, moisture and rugae, cervix:smooth, uterus: normal size, shape and contour, non tender, no masses felt, adnexa: no masses or tenderness noted. Wet prep: + yeast buds and +WBCs.  Assessment:    Vaginal discharge  Yeast     Plan:     Rx Terazol 7 cream use 1 applicator at HS with 1 refill Follow up prn, has appt 7/7 for physical Review handout on yeast   Go her to go to ER if any chest pain,weakness dizziness or feels different.

## 2014-02-24 NOTE — Patient Instructions (Signed)
Monilial Vaginitis Vaginitis in a soreness, swelling and redness (inflammation) of the vagina and vulva. Monilial vaginitis is not a sexually transmitted infection. CAUSES  Yeast vaginitis is caused by yeast (candida) that is normally found in your vagina. With a yeast infection, the candida has overgrown in number to a point that upsets the chemical balance. SYMPTOMS   White, thick vaginal discharge.  Swelling, itching, redness and irritation of the vagina and possibly the lips of the vagina (vulva).  Burning or painful urination.  Painful intercourse. DIAGNOSIS  Things that may contribute to monilial vaginitis are:  Postmenopausal and virginal states.  Pregnancy.  Infections.  Being tired, sick or stressed, especially if you had monilial vaginitis in the past.  Diabetes. Good control will help lower the chance.  Birth control pills.  Tight fitting garments.  Using bubble bath, feminine sprays, douches or deodorant tampons.  Taking certain medications that kill germs (antibiotics).  Sporadic recurrence can occur if you become ill. TREATMENT  Your caregiver will give you medication.  There are several kinds of anti monilial vaginal creams and suppositories specific for monilial vaginitis. For recurrent yeast infections, use a suppository or cream in the vagina 2 times a week, or as directed.  Anti-monilial or steroid cream for the itching or irritation of the vulva may also be used. Get your caregiver's permission.  Painting the vagina with methylene blue solution may help if the monilial cream does not work.  Eating yogurt may help prevent monilial vaginitis. HOME CARE INSTRUCTIONS   Finish all medication as prescribed.  Do not have sex until treatment is completed or after your caregiver tells you it is okay.  Take warm sitz baths.  Do not douche.  Do not use tampons, especially scented ones.  Wear cotton underwear.  Avoid tight pants and panty  hose.  Tell your sexual partner that you have a yeast infection. They should go to their caregiver if they have symptoms such as mild rash or itching.  Your sexual partner should be treated as well if your infection is difficult to eliminate.  Practice safer sex. Use condoms.  Some vaginal medications cause latex condoms to fail. Vaginal medications that harm condoms are:  Cleocin cream.  Butoconazole (Femstat).  Terconazole (Terazol) vaginal suppository.  Miconazole (Monistat) (may be purchased over the counter). SEEK MEDICAL CARE IF:   You have a temperature by mouth above 102 F (38.9 C).  The infection is getting worse after 2 days of treatment.  The infection is not getting better after 3 days of treatment.  You develop blisters in or around your vagina.  You develop vaginal bleeding, and it is not your menstrual period.  You have pain when you urinate.  You develop intestinal problems.  You have pain with sexual intercourse. Document Released: 05/28/2005 Document Revised: 11/10/2011 Document Reviewed: 02/09/2009 [facility] Patient Information 2015 [place], [place]. This information is not intended to replace advice given to you by your health care provider. Make sure you discuss any questions you have with your health care provider. Use terazol 7 Follow up prn

## 2014-02-28 ENCOUNTER — Telehealth: Payer: Self-pay | Admitting: Obstetrics and Gynecology

## 2014-02-28 DIAGNOSIS — L03039 Cellulitis of unspecified toe: Secondary | ICD-10-CM | POA: Diagnosis not present

## 2014-02-28 NOTE — Telephone Encounter (Signed)
Spoke with pt. Pt was prescribed Levaquin 1 daily x 7 days by another dr for her toe. Pt has never been on this med. I advised pt to take med and if she started having any side effects, to let the dr know that prescribed it. Pt voiced understanding. [place]

## 2014-03-07 ENCOUNTER — Ambulatory Visit (INDEPENDENT_AMBULATORY_CARE_PROVIDER_SITE_OTHER): Payer: Medicare Other | Admitting: Adult Health

## 2014-03-07 ENCOUNTER — Telehealth: Payer: Self-pay | Admitting: Adult Health

## 2014-03-07 ENCOUNTER — Encounter: Payer: Self-pay | Admitting: Adult Health

## 2014-03-07 VITALS — BP 144/82 | HR 76 | Ht 66.0 in | Wt 119.0 lb

## 2014-03-07 DIAGNOSIS — L03039 Cellulitis of unspecified toe: Secondary | ICD-10-CM | POA: Diagnosis not present

## 2014-03-07 DIAGNOSIS — R030 Elevated blood-pressure reading, without diagnosis of hypertension: Secondary | ICD-10-CM

## 2014-03-07 DIAGNOSIS — R1011 Right upper quadrant pain: Secondary | ICD-10-CM

## 2014-03-07 DIAGNOSIS — IMO0001 Reserved for inherently not codable concepts without codable children: Secondary | ICD-10-CM

## 2014-03-07 DIAGNOSIS — B351 Tinea unguium: Secondary | ICD-10-CM | POA: Diagnosis not present

## 2014-03-07 DIAGNOSIS — Z1212 Encounter for screening for malignant neoplasm of rectum: Secondary | ICD-10-CM | POA: Diagnosis not present

## 2014-03-07 DIAGNOSIS — M546 Pain in thoracic spine: Secondary | ICD-10-CM

## 2014-03-07 DIAGNOSIS — M549 Dorsalgia, unspecified: Secondary | ICD-10-CM

## 2014-03-07 DIAGNOSIS — Z01419 Encounter for gynecological examination (general) (routine) without abnormal findings: Secondary | ICD-10-CM | POA: Diagnosis not present

## 2014-03-07 DIAGNOSIS — I10 Essential (primary) hypertension: Secondary | ICD-10-CM | POA: Diagnosis not present

## 2014-03-07 HISTORY — DX: Right upper quadrant pain: R10.11

## 2014-03-07 HISTORY — DX: Dorsalgia, unspecified: M54.9

## 2014-03-07 LAB — HEMOCCULT GUIAC POC 1CARD (OFFICE): FECAL OCCULT BLD: NEGATIVE

## 2014-03-07 LAB — POCT URINALYSIS DIPSTICK
Glucose, UA: NEGATIVE
Nitrite, UA: NEGATIVE
Protein, UA: NEGATIVE

## 2014-03-07 NOTE — Telephone Encounter (Signed)
Left message x 1. JSY 

## 2014-03-07 NOTE — Patient Instructions (Signed)
GB US 7/9 at 8 am NPO be there at 7:45 am Return in 3 weeks for BP check Low-Fat Diet for Pancreatitis or Gallbladder Conditions A low-fat diet can be helpful if you have pancreatitis or a gallbladder condition. With these conditions, your pancreas and gallbladder have trouble digesting fats. A healthy eating plan with less fat will help rest your pancreas and gallbladder and reduce your symptoms. WHAT DO I NEED TO KNOW ABOUT THIS DIET?  Eat a low-fat diet.  Reduce your fat intake to less than 20-30% of your total daily calories. This is less than 50-60 g of fat per day.  Remember that you need some fat in your diet. Ask your dietician what your daily goal should be.  Choose nonfat and low-fat healthy foods. Look for the words "nonfat," "low fat," or "fat free."  As a guide, look on the label and choose foods with less than 3 g of fat per serving. Eat only one serving.  Avoid alcohol.  Do not smoke. If you need help quitting, talk with your health care provider.  Eat small frequent meals instead of three large heavy meals. WHAT FOODS CAN I EAT? Grains Include healthy grains and starches such as potatoes, wheat bread, fiber-rich cereal, and brown rice. Choose whole grain options whenever possible. In adults, whole grains should account for 45-65% of your daily calories.  Fruits and Vegetables Eat plenty of fruits and vegetables. Fresh fruits and vegetables add fiber to your diet. Meats and Other Protein Sources Eat lean meat such as chicken and pork. Trim any fat off of meat before cooking it. Eggs, fish, and beans are other sources of protein. In adults, these foods should account for 10-35% of your daily calories. Dairy Choose low-fat milk and dairy options. Dairy includes fat and protein, as well as calcium.  Fats and Oils Limit high-fat foods such as fried foods, sweets, baked goods, sugary drinks.  Other Creamy sauces and condiments, such as mayonnaise, can add extra fat. Think  about whether or not you need to use them, or use smaller amounts or low fat options. WHAT FOODS ARE NOT RECOMMENDED?  High fat foods, such as:  [employer].  Ice cream.  [place] toast.  Sweet rolls.  Pizza.  Cheese bread.  Foods covered with batter, butter, creamy sauces, or cheese.  Fried foods.  Sugary drinks and desserts.  Foods that cause gas or bloating Document Released: 08/23/2013 Document Reviewed: 08/01/2013 [facility] Patient Information 2015 [place], [place]. This information is not intended to replace advice given to you by your health care provider. Make sure you discuss any questions you have with your health care provider.

## 2014-03-07 NOTE — Progress Notes (Signed)
[name], female   DOB: 12-07-1955, 58 y.o.   MRN: [id number] History of Present Illness:  April Knox is a 58 year old white female, widowed, in for physical,she had a normal pap with negative HPV 7/3/1.She is complaining of pain in back between shoulder blades and in stomach, has been awoken at night and feels full.  Current Medications, Allergies, Past Medical History, Past Surgical History, Family History and Social History were reviewed in [employer] record.     Review of Systems: Patient denies any headaches, blurred vision, shortness of breath, chest pain, problems with bowel movements, urination, or intercourse.  No joint pain or mood swings.See HPI for positives.   Physical Exam:BP 144/82  Pulse 76  Ht 5\' 6"  (1.676 m)  Wt 119 lb (53.978 kg)  BMI 19.22 kg/m2urine 1+leuks and trace blood. General:  Well developed, well nourished, no acute distress Skin:  Warm and dry Neck:  Midline trachea, normal thyroid Lungs; Clear to auscultation bilaterally Breast:  No dominant palpable mass, retraction, or nipple discharge, has bilateral implants Cardiovascular: Regular rate and rhythm Abdomen:  Soft, non tender, no hepatosplenomegaly, noted today Pelvic:  External genitalia is normal in appearance.  The vagina has loss of color, moisture and rugae    The cervix is atrophic.  Uterus is felt to be normal size, shape, and contour.  No  adnexal masses or tenderness noted. Rectal: Good sphincter tone, no polyps, or hemorrhoids felt.  Hemoccult negative. Extremities:  No swelling or varicosities noted Psych:  No mood changes,alert and cooperative,seems nervous about BP being up and pain in stomach and back, will get gallbladder US to assess pain.   Impression: Yearly gyn exam no pap RUQ pain Back pain  Elevated BP    Plan: Return in 3 weeks to recheck BP GB US [facility] at [facility] 2 years Review low fat diet plan to see if feels  better

## 2014-03-07 NOTE — Telephone Encounter (Signed)
Spoke with pt letting her know urine showed a trace of blood and some white blood cells. I spoke with [name], NP who advised it's nothing to get excited about. If pt's backpain gets worse, or if she has any other symptoms, pt was advised to call us back. Pt voiced understanding. [place]

## 2014-03-08 ENCOUNTER — Encounter: Payer: Self-pay | Admitting: Adult Health

## 2014-03-08 ENCOUNTER — Ambulatory Visit (INDEPENDENT_AMBULATORY_CARE_PROVIDER_SITE_OTHER): Payer: Medicare Other

## 2014-03-08 ENCOUNTER — Ambulatory Visit: Payer: Medicare Other | Admitting: Adult Health

## 2014-03-08 VITALS — BP 140/82

## 2014-03-08 VITALS — BP 128/70 | HR 74

## 2014-03-08 DIAGNOSIS — I471 Supraventricular tachycardia: Secondary | ICD-10-CM | POA: Diagnosis not present

## 2014-03-08 DIAGNOSIS — R030 Elevated blood-pressure reading, without diagnosis of hypertension: Principal | ICD-10-CM

## 2014-03-08 DIAGNOSIS — IMO0001 Reserved for inherently not codable concepts without codable children: Secondary | ICD-10-CM

## 2014-03-08 NOTE — Progress Notes (Signed)
Pt walked in worried that her BP was up since she was told it was elevated at GYN apt yesterday   Manual BP 128/71 HR 74   Reassured pt

## 2014-03-09 ENCOUNTER — Telehealth: Payer: Self-pay | Admitting: Adult Health

## 2014-03-09 ENCOUNTER — Ambulatory Visit (HOSPITAL_COMMUNITY)
Admission: RE | Admit: 2014-03-09 | Discharge: 2014-03-09 | Disposition: A | Discharge status: Home / Self Care | Payer: Medicare Other | Source: Ambulatory Visit | Attending: Adult Health | Admitting: Adult Health

## 2014-03-09 DIAGNOSIS — R1011 Right upper quadrant pain: Secondary | ICD-10-CM | POA: Insufficient documentation

## 2014-03-09 DIAGNOSIS — K7689 Other specified diseases of liver: Secondary | ICD-10-CM | POA: Diagnosis not present

## 2014-03-09 NOTE — Telephone Encounter (Signed)
Pt aware of negative gallstones on [place]

## 2014-03-10 NOTE — Progress Notes (Signed)
BP looks good.   Zandra Abts MD

## 2014-03-13 DIAGNOSIS — M5137 Other intervertebral disc degeneration, lumbosacral region: Secondary | ICD-10-CM | POA: Diagnosis not present

## 2014-03-13 DIAGNOSIS — M533 Sacrococcygeal disorders, not elsewhere classified: Secondary | ICD-10-CM | POA: Diagnosis not present

## 2014-03-13 DIAGNOSIS — M5430 Sciatica, unspecified side: Secondary | ICD-10-CM | POA: Diagnosis not present

## 2014-03-13 DIAGNOSIS — M545 Low back pain, unspecified: Secondary | ICD-10-CM | POA: Diagnosis not present

## 2014-03-20 ENCOUNTER — Telehealth: Payer: Self-pay | Admitting: *Deleted

## 2014-03-20 ENCOUNTER — Telehealth: Payer: Self-pay | Admitting: Adult Health

## 2014-03-20 NOTE — Telephone Encounter (Signed)
Spoke with pt. Pt is concerned about her BP. It was 150/80 at her last appt. Pt states she has had some tingling in lower left arm and wonders if it's related to her BP. Pt was at Dr. San Jetty office to have her BP checked. I advised to let them know about the tingling in her arm. Pt voiced understanding. [place]

## 2014-03-20 NOTE — Telephone Encounter (Signed)
Pt states blood pressure 130/90, feels elevated blood pressure is hormone related. April Monaco, NP aware of pt blood pressure of 130/90 and states for pt to keep her appt for 03/28/2014. Pt verbalized understanding.

## 2014-03-23 ENCOUNTER — Ambulatory Visit (INDEPENDENT_AMBULATORY_CARE_PROVIDER_SITE_OTHER): Payer: Medicare Other | Admitting: Internal Medicine

## 2014-03-23 ENCOUNTER — Encounter (INDEPENDENT_AMBULATORY_CARE_PROVIDER_SITE_OTHER): Payer: Self-pay | Admitting: *Deleted

## 2014-03-23 ENCOUNTER — Encounter (INDEPENDENT_AMBULATORY_CARE_PROVIDER_SITE_OTHER): Payer: Self-pay | Admitting: Internal Medicine

## 2014-03-23 VITALS — BP 112/60 | HR 70 | Temp 97.9°F | Ht 66.0 in | Wt 115.2 lb

## 2014-03-23 DIAGNOSIS — R1011 Right upper quadrant pain: Secondary | ICD-10-CM

## 2014-03-23 NOTE — Patient Instructions (Signed)
HIDA scan.

## 2014-03-23 NOTE — Progress Notes (Signed)
Subjective:     [name], female   DOB: Aug 13, 1956, 58 y.o.   MRN: [id number]  HPI Presents today with c/o epigastric pain and rt upper quadrant a couple of weeks. The pain lasted for about a weeks. The pain was not constant.  She describes as a dull ache. Pain occurred while her B/P was elevated. Highest her B/P was about 150/90. No prior hx of HTN. There is no epigastric pain now. She is symptoms free.  Today she says she feels good.  She avoids greasy foods. No acid reflux or dysphagia. No abdominal pain at this time. BMs are normal. No melena or BRRB. She is not walking or exercising as much. 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  Hepatic cysts.   04/27/2012 HIDA scan: Gallbladder ejection fraction: 21.8. Normal gallbladder ejection  fraction with Ensure is greater than 33%.  The patient did not experience symptoms after oral ingestion of  Ensure.  IMPRESSION:  1. Decreased gallbladder ejection fraction.  2. Otherwise normal study.    Review of Systems Past Medical History  Diagnosis Date  . Chest pain   . Anxiety   . PSVT (paroxysmal supraventricular tachycardia)     Documented by event recorder  . Cervical spine disease     Mild  . Abnormal Pap smear   . Rectocele 03/03/2013  . H/O bilateral breast implants 03/03/2013  . Elevated cholesterol   . Vaginal dryness, menopausal 12/06/2013  . Hot flashes 12/06/2013  . Menopausal symptoms 12/06/2013  . Ganglion cyst of left foot 12/06/2013  . Vaginal discharge 02/24/2014  . Yeast infection 02/24/2014  . Vaginal Pap smear, abnormal   . RUQ pain 03/07/2014  . Back pain 03/07/2014    Past Surgical History  Procedure Laterality Date  . Breast enhancement surgery    . Toe biopsy      Allergies  Allergen Reactions  . Bactrim Ds [Sulfamethoxazole-Tmp Ds]     Hives   . Ciprocinonide [Fluocinolone] Hives    " made me feel awful"   . Penicillins   . Sulfa  Antibiotics Hives, Diarrhea, Nausea Only and Rash    Current Outpatient Prescriptions on File Prior to Visit  Medication Sig Dispense Refill  . Multiple Vitamin (MULTIVITAMIN) tablet Take 1 tablet by mouth daily.        Marland Kitchen omeprazole (PRILOSEC) 20 MG capsule Take 1 capsule (20 mg total) by mouth daily.  30 capsule  11   No current facility-administered medications on file prior to visit.        Objective:   Physical Exam  Filed Vitals:   03/23/14 0913  BP: 112/60  Pulse: 70  Temp: 97.9 F (36.6 C)  Height: 5\' 6"  (1.676 m)  Weight: 115 lb 3.2 oz (52.254 kg)   Alert and oriented. Skin warm and dry. Oral mucosa is moist.   . Sclera anicteric, conjunctivae is pink. Thyroid not enlarged. No cervical lymphadenopathy. Lungs clear. Heart regular rate and rhythm.  Abdomen is soft. Bowel sounds are positive. No hepatomegaly. No abdominal masses felt. No tenderness.  No edema to lower extremities.       Assessment:     Epigastric pain resolved. ?GB disease. HIDA scan abnormal 2 yrs ago. Recent abdominal US negative.    Plan:     HIDA scan today.

## 2014-03-28 ENCOUNTER — Ambulatory Visit (INDEPENDENT_AMBULATORY_CARE_PROVIDER_SITE_OTHER): Payer: Medicare Other | Admitting: Adult Health

## 2014-03-28 ENCOUNTER — Encounter: Payer: Self-pay | Admitting: Adult Health

## 2014-03-28 VITALS — BP 140/82 | Ht 66.0 in | Wt 118.0 lb

## 2014-03-28 DIAGNOSIS — Z87440 Personal history of urinary (tract) infections: Secondary | ICD-10-CM

## 2014-03-28 DIAGNOSIS — IMO0001 Reserved for inherently not codable concepts without codable children: Secondary | ICD-10-CM

## 2014-03-28 DIAGNOSIS — R030 Elevated blood-pressure reading, without diagnosis of hypertension: Secondary | ICD-10-CM | POA: Diagnosis not present

## 2014-03-28 DIAGNOSIS — R319 Hematuria, unspecified: Secondary | ICD-10-CM

## 2014-03-28 HISTORY — DX: Hematuria, unspecified: R31.9

## 2014-03-28 LAB — POCT URINALYSIS DIPSTICK
Glucose, UA: NEGATIVE
NITRITE UA: NEGATIVE
PROTEIN UA: NEGATIVE

## 2014-03-28 NOTE — Progress Notes (Signed)
Subjective:     [name], female   DOB: 1956-03-30, 58 y.o.   MRN: [id number]  HPI April Knox is a 58 year old white female, widowed, in for BP check and complains of back pain between shouder blades.She is not sleeping but about 4-5 hours and is working about 15 hours.Has felt sad about living alone, declines meds.,worries about her kids, but denies suicidal ideation.  Review of Systems See HPI Reviewed past medical,surgical, social and family history. Reviewed medications and allergies.     Objective:   Physical Exam BP 140/82  Ht 5\' 6"  (1.676 m)  Wt 118 lb (53.524 kg)  BMI 19.05 kg/m2urine 2+leuks and trace blood,   BP recheck 136/74 left, Skin warm and dry.Lungs: clear to ausculation bilaterally. Cardiovascular: regular rate and rhythm.No CVAT.Discussed could be muscle related try ice at night, declines muscle relaxer.Discussed trying to relax more.  Assessment:     Elevated BP Hematuria History of UTI    Plan:    UA C&S sent Try to take time for self, try to get more sleep Push fluids Review handout on hypertension Follow up in  4 weeks for BP check

## 2014-03-28 NOTE — Patient Instructions (Signed)
Try to relax  Get more sleep Return in 4 weeks for BP check Push fluids Hypertension Hypertension, commonly called high blood pressure, is when the force of blood pumping through your arteries is too strong. Your arteries are the blood vessels that carry blood from your heart throughout your body. A blood pressure reading consists of a higher number over a lower number, such as 110/72. The higher number (systolic) is the pressure inside your arteries when your heart pumps. The lower number (diastolic) is the pressure inside your arteries when your heart relaxes. Ideally you want your blood pressure below 120/80. Hypertension forces your heart to work harder to pump blood. Your arteries may become narrow or stiff. Having hypertension puts you at risk for heart disease, stroke, and other problems.  RISK FACTORS Some risk factors for high blood pressure are controllable. Others are not.  Risk factors you cannot control include:   Race. You may be at higher risk if you are African American.  Age. Risk increases with age.  Gender. Men are at higher risk than women before age 24 years. After age 56, women are at higher risk than men. Risk factors you can control include:  Not getting enough exercise or physical activity.  Being overweight.  Getting too much fat, sugar, calories, or salt in your diet.  Drinking too much alcohol. SIGNS AND SYMPTOMS Hypertension does not usually cause signs or symptoms. Extremely high blood pressure (hypertensive crisis) may cause headache, anxiety, shortness of breath, and nosebleed. DIAGNOSIS  To check if you have hypertension, your health care provider will measure your blood pressure while you are seated, with your arm held at the level of your heart. It should be measured at least twice using the same arm. Certain conditions can cause a difference in blood pressure between your right and left arms. A blood pressure reading that is higher than normal on one  occasion does not mean that you need treatment. If one blood pressure reading is high, ask your health care provider about having it checked again. TREATMENT  Treating high blood pressure includes making lifestyle changes and possibly taking medicine. Living a healthy lifestyle can help lower high blood pressure. You may need to change some of your habits. Lifestyle changes may include:  Following the DASH diet. This diet is high in fruits, vegetables, and whole grains. It is low in salt, red meat, and added sugars.  Getting at least 2 hours of brisk physical activity every week.  Losing weight if necessary.  Not smoking.  Limiting alcoholic beverages.  Learning ways to reduce stress. If lifestyle changes are not enough to get your blood pressure under control, your health care provider may prescribe medicine. You may need to take more than one. Work closely with your health care provider to understand the risks and benefits. HOME CARE INSTRUCTIONS  Have your blood pressure rechecked as directed by your health care provider.   Take medicines only as directed by your health care provider. Follow the directions carefully. Blood pressure medicines must be taken as prescribed. The medicine does not work as well when you skip doses. Skipping doses also puts you at risk for problems.   Do not smoke.   Monitor your blood pressure at home as directed by your health care provider. SEEK MEDICAL CARE IF:   You think you are having a reaction to medicines taken.  You have recurrent headaches or feel dizzy.  You have swelling in your ankles.  You have trouble  with your vision. SEEK IMMEDIATE MEDICAL CARE IF:  You develop a severe headache or confusion.  You have unusual weakness, numbness, or feel faint.  You have severe chest or abdominal pain.  You vomit repeatedly.  You have trouble breathing. MAKE SURE YOU:   Understand these instructions.  Will watch your  condition.  Will get help right away if you are not doing well or get worse. Document Released: 08/18/2005 Document Revised: 01/02/2014 Document Reviewed: 06/10/2013 [facility] Patient Information 2015 [place], [place]. This information is not intended to replace advice given to you by your health care provider. Make sure you discuss any questions you have with your health care provider.

## 2014-03-29 LAB — URINALYSIS
Bilirubin Urine: NEGATIVE
Glucose, UA: NEGATIVE mg/dL
Hgb urine dipstick: NEGATIVE
Ketones, ur: NEGATIVE mg/dL
Nitrite: NEGATIVE
Protein, ur: NEGATIVE mg/dL
Specific Gravity, Urine: 1.023 (ref 1.005–1.030)
UROBILINOGEN UA: 0.2 mg/dL (ref 0.0–1.0)
pH: 6 (ref 5.0–8.0)

## 2014-03-29 LAB — URINE CULTURE
Colony Count: NO GROWTH
Organism ID, Bacteria: NO GROWTH

## 2014-03-30 ENCOUNTER — Telehealth: Payer: Self-pay | Admitting: *Deleted

## 2014-03-30 NOTE — Telephone Encounter (Signed)
Spoke with pt letting her know urine culture was normal. Pt voiced understanding. [place]

## 2014-03-31 DIAGNOSIS — F3289 Other specified depressive episodes: Secondary | ICD-10-CM | POA: Diagnosis not present

## 2014-03-31 DIAGNOSIS — F329 Major depressive disorder, single episode, unspecified: Secondary | ICD-10-CM | POA: Diagnosis not present

## 2014-04-03 ENCOUNTER — Encounter (HOSPITAL_COMMUNITY): Payer: Self-pay

## 2014-04-03 ENCOUNTER — Encounter: Payer: Self-pay | Admitting: Adult Health

## 2014-04-03 ENCOUNTER — Encounter (HOSPITAL_COMMUNITY)
Admission: RE | Admit: 2014-04-03 | Discharge: 2014-04-03 | Disposition: A | Discharge status: Home / Self Care | Payer: Medicare Other | Source: Ambulatory Visit | Attending: Internal Medicine | Admitting: Internal Medicine

## 2014-04-03 ENCOUNTER — Ambulatory Visit (INDEPENDENT_AMBULATORY_CARE_PROVIDER_SITE_OTHER): Payer: Medicare Other | Admitting: Adult Health

## 2014-04-03 VITALS — BP 122/70 | HR 65 | Ht 66.0 in | Wt 114.0 lb

## 2014-04-03 VITALS — BP 130/60

## 2014-04-03 DIAGNOSIS — IMO0001 Reserved for inherently not codable concepts without codable children: Secondary | ICD-10-CM

## 2014-04-03 DIAGNOSIS — R1011 Right upper quadrant pain: Secondary | ICD-10-CM | POA: Diagnosis not present

## 2014-04-03 DIAGNOSIS — R030 Elevated blood-pressure reading, without diagnosis of hypertension: Secondary | ICD-10-CM

## 2014-04-03 DIAGNOSIS — I471 Supraventricular tachycardia: Secondary | ICD-10-CM | POA: Diagnosis not present

## 2014-04-03 DIAGNOSIS — F411 Generalized anxiety disorder: Secondary | ICD-10-CM | POA: Diagnosis not present

## 2014-04-03 DIAGNOSIS — F419 Anxiety disorder, unspecified: Secondary | ICD-10-CM

## 2014-04-03 HISTORY — DX: Essential (primary) hypertension: I10

## 2014-04-03 MED ORDER — TECHNETIUM TC 99M MEBROFENIN IV KIT
5.0000 | PACK | Freq: Once | INTRAVENOUS | Status: AC | PRN
  Administered 2014-04-03: 5 via INTRAVENOUS

## 2014-04-03 MED ORDER — STERILE WATER FOR INJECTION IJ SOLN
INTRAMUSCULAR | Status: AC
  Administered 2014-04-03: 1.08 mL via INTRAVENOUS
  Filled 2014-04-03: qty 10

## 2014-04-03 MED ORDER — SINCALIDE 5 MCG IJ SOLR
INTRAMUSCULAR | Status: AC
  Administered 2014-04-03: 1.08 ug via INTRAVENOUS
  Filled 2014-04-03: qty 5

## 2014-04-03 NOTE — Progress Notes (Signed)
[name], female   DOB: Sep 30, 1955, 58 y.o.   MRN: [id number] Pt here today for BP recheck. Pt's BP is 130/60 today and has been checking BP at home almost every day and has been getting around the same readings. Pt is keeping a log of BP's and will bring that with her when she comes back in a month. Pt will call if anything changes.

## 2014-04-03 NOTE — Assessment & Plan Note (Signed)
Has had high scan completed today for evaluation of gallbladder dysfunction.

## 2014-04-03 NOTE — Assessment & Plan Note (Signed)
She has not had any recurrence of PSVT. She denies palpitations subjectively. No changes in her medication regimen.

## 2014-04-03 NOTE — Assessment & Plan Note (Signed)
I have checked her blood pressure here in the office, reviewed a trend prior office visits all of which have been within normal range. I believe anxiety was related to the elevation in blood pressure to OB/GYN office. Reassurance has been given. She is to the normally, not skip meals,drink plenty of fluids during the summer, and get as much rest as possible. This will assist with lightheadedness and generalized fatigue.

## 2014-04-03 NOTE — Progress Notes (Deleted)
Name: April Knox    DOB: 09/03/1955  Age: 58 y.o.  MR#: [id number]       PCP:  [name], MD      Insurance: Payor: MEDICARE / Plan: MEDICARE PART A AND B / Product Type: *No Product type* /   CC:    Chief Complaint  Patient presents with  . Chest Pain  . Tachycardia    VS Filed Vitals:   04/03/14 1406  BP: 122/70  Pulse: 65  Height: 5\' 6"  (1.676 m)  Weight: 114 lb (51.71 kg)  SpO2: 97%    Weights Current Weight  04/03/14 114 lb (51.71 kg)  03/28/14 118 lb (53.524 kg)  03/23/14 115 lb 3.2 oz (52.254 kg)    Blood Pressure  BP Readings from Last 3 Encounters:  04/03/14 122/70  04/03/14 130/60  03/28/14 140/82     Admit date:  (Not on file) Last encounter with RMR:  Visit date not found   Allergy Bactrim ds; Ciprocinonide; Penicillins; and Sulfa antibiotics  Current Outpatient Prescriptions  Medication Sig Dispense Refill  . cetirizine (ZYRTEC) 10 MG tablet Take 10 mg by mouth as needed.      . Multiple Vitamin (MULTIVITAMIN) tablet Take 1 tablet by mouth daily.        Marland Kitchen omeprazole (PRILOSEC) 20 MG capsule Take 1 capsule (20 mg total) by mouth daily.  30 capsule  11   No current facility-administered medications for this visit.    Discontinued Meds:   There are no discontinued medications.  Patient Active Problem List   Diagnosis Date Noted  . Hematuria 03/28/2014  . RUQ pain 03/07/2014  . Back pain 03/07/2014  . Elevated BP 03/07/2014  . Vaginal discharge 02/24/2014  . Yeast infection 02/24/2014  . Vaginal dryness, menopausal 12/06/2013  . Hot flashes 12/06/2013  . Menopausal symptoms 12/06/2013  . Ganglion cyst of left foot 12/06/2013  . GERD (gastroesophageal reflux disease) 08/11/2013  . Rectocele 03/03/2013  . H/O bilateral breast implants 03/03/2013  . Laboratory test 07/22/2012  . PSVT (paroxysmal supraventricular tachycardia)   . Anxiety 06/26/2011    LABS    Component Value Date/Time   NA 141 11/01/2013 1428   NA 139 09/20/2010  2047   NA 140 05/21/2009   K 4.7 11/01/2013 1428   K 3.9 09/20/2010 2047   K 4.7 05/21/2009   CL 102 11/01/2013 1428   CL 100 09/20/2010 2047   CL 103 05/21/2009   CO2 31 11/01/2013 1428   CO2 30 09/20/2010 2047   CO2 24 05/21/2009   GLUCOSE 94 11/01/2013 1428   GLUCOSE 99 09/20/2010 2047   GLUCOSE 88 05/21/2009   BUN 22 11/01/2013 1428   BUN 13 09/20/2010 2047   BUN 14 05/21/2009   CREATININE 0.61 11/01/2013 1428   CREATININE 0.62 09/20/2010 2047   CREATININE 0.86 05/21/2009   CALCIUM 9.4 11/01/2013 1428   CALCIUM 10.0 09/20/2010 2047   CALCIUM 9.5 05/21/2009   CMP     Component Value Date/Time   NA 141 11/01/2013 1428   K 4.7 11/01/2013 1428   CL 102 11/01/2013 1428   CO2 31 11/01/2013 1428   GLUCOSE 94 11/01/2013 1428   BUN 22 11/01/2013 1428   CREATININE 0.61 11/01/2013 1428   CREATININE 0.62 09/20/2010 2047   CALCIUM 9.4 11/01/2013 1428   PROT 6.4 11/01/2013 1428   ALBUMIN 4.1 11/01/2013 1428   AST 20 11/01/2013 1428   ALT 19 11/01/2013 1428   ALKPHOS 67 11/01/2013 1428  BILITOT 0.4 11/01/2013 1428       Component Value Date/Time   WBC 4.3 11/01/2013 1428   WBC 4.1 04/13/2012 1045   WBC 4.6 09/20/2010 2047   HGB 12.9 11/01/2013 1428   HGB 14.5 04/13/2012 1045   HGB 14.9 09/20/2010 2047   HCT 38.4 11/01/2013 1428   HCT 41.8 04/13/2012 1045   HCT 45.1 09/20/2010 2047   MCV 94.6 11/01/2013 1428   MCV 92.7 04/13/2012 1045   MCV 98.7 09/20/2010 2047    Lipid Panel     Component Value Date/Time   CHOL 150 11/01/2013 1428   TRIG 63 11/01/2013 1428   HDL 51 11/01/2013 1428   CHOLHDL 2.9 11/01/2013 1428   VLDL 13 11/01/2013 1428   LDLCALC 86 11/01/2013 1428    ABG No results found for this basename: phart, pco2, pco2art, po2, po2art, hco3, tco2, acidbasedef, o2sat     Lab Results  Component Value Date   TSH 0.876 11/01/2013   BNP (last 3 results) No results found for this basename: PROBNP,  in the last 8760 hours Cardiac Panel (last 3 results) No results found for this basename: CKTOTAL, CKMB, TROPONINI, RELINDX,  in the last  72 hours  Iron/TIBC/Ferritin/ %Sat No results found for this basename: iron, tibc, ferritin, ironpctsat     EKG Orders placed in visit on 01/19/14  . EKG 12-LEAD     Prior Assessment and Plan Problem List as of 04/03/2014     Cardiovascular and Mediastinum   PSVT (paroxysmal supraventricular tachycardia)   Last Assessment & Plan   01/18/2013 Office Visit Written 01/18/2013  2:37 PM by Lendon Colonel, NP     No further complaints of PSVT. Chest pain most likely musculoskeletal. No changes in medication regimen. EKG unchanged. Will see in one year unless symptomatic.      Digestive   Rectocele   GERD (gastroesophageal reflux disease)     Genitourinary   Vaginal dryness, menopausal     Other   Anxiety   Last Assessment & Plan   01/18/2013 Office Visit Written 01/18/2013  2:38 PM by Lendon Colonel, NP     Occasional occurences. Will defer to PCP    Laboratory test   H/O bilateral breast implants   Hot flashes   Menopausal symptoms   Ganglion cyst of left foot   Vaginal discharge   Yeast infection   RUQ pain   Back pain   Elevated BP   Hematuria       Imaging: Nm Hepato W/eject Fract  04/03/2014   CLINICAL DATA:  Right upper quadrant pain  EXAM: NUCLEAR MEDICINE HEPATOBILIARY IMAGING WITH GALLBLADDER EF  TECHNIQUE: Sequential images of the abdomen were obtained out to 60 minutes following intravenous administration of radiopharmaceutical. After slow intravenous infusion of 1.08 micrograms Cholecystokinin, gallbladder ejection fraction was determined.  RADIOPHARMACEUTICALS:  5 Mill[employer]ie ZO-10R Choletec  COMPARISON:  None.  FINDINGS: There is immediate homogeneous uptake of radiotracer in the liver. Filling of the gallbladder begins at 10 minutes. Radiotracer uptake is present in the small bowel at 45 minutes.  At the end of 1-hour, there is relatively good clearance of activity from the liver.  When gallbladder filling was complete, the patient was given an IV infusion of  CCK. The gallbladder ejection fraction measures 83%. At 30 min, normal ejection fraction is greater than 30%.  The patient did experience symptoms during CCK infusion.  IMPRESSION: 1. Normal hepatobiliary scan. 2. Normal gallbladder ejection fraction.   Electronically Signed  By: Kathreen Devoid   On: 04/03/2014 10:58   US Abdomen Limited  03/09/2014   CLINICAL DATA:  Right upper quadrant pain  EXAM: US ABDOMEN LIMITED - RIGHT UPPER QUADRANT  COMPARISON:  Abdominal ultrasound 07/31/2010  FINDINGS: Gallbladder:  No gallstones or wall thickening visualized. No sonographic Murphy sign noted.  Common bile duct:  Diameter: 2.0 mm  Liver:  Cyst left lobe measuring 7 x 9 mm, unchanged  Cyst in the right lobe measures 29 x 17 x 23 mm, slightly increased in size from the prior study but with an overall benign appearance. Liver otherwise negative  IMPRESSION: Negative for gallstones.  Hepatic cysts.   Electronically Signed   By: Franchot Gallo M.D.   On: 03/09/2014 09:04

## 2014-04-03 NOTE — Progress Notes (Signed)
HPI: April Knox is a 58 year old female patient of Dr. Harl Bowie we are following for ongoing assessment and management of PSVT with history of chest pain anxiety and hypercholesterolemia. The patient is here for annual followup, as her PSVT was rare she is medically compliant.  She comes today because she is concerned that her blood pressure was elevated. She been seen by her OB/GYN Dr. Glo Herring, was found to be mildly hypertensive with a blood pressure of 150/78. The patient is become very anxious about this, she is limiting salt from her diet walking more and also decreasing her calorie intake. The patient is very thin to begin with, and has been eliminating  meals assuming this is related to her blood pressure. The patient is also working 2 jobs and getting very little sleep, stating she sleeps approximately 5 hours total. She has been feeling lightheaded and dizzy as a result.  Allergies  Allergen Reactions  . Bactrim Ds [Sulfamethoxazole-Tmp Ds]     Hives   . Ciprocinonide [Fluocinolone] Hives    " made me feel awful"   . Penicillins   . Sulfa Antibiotics Hives, Diarrhea, Nausea Only and Rash    Current Outpatient Prescriptions  Medication Sig Dispense Refill  . cetirizine (ZYRTEC) 10 MG tablet Take 10 mg by mouth as needed.      . Multiple Vitamin (MULTIVITAMIN) tablet Take 1 tablet by mouth daily.        Marland Kitchen omeprazole (PRILOSEC) 20 MG capsule Take 1 capsule (20 mg total) by mouth daily.  30 capsule  11   No current facility-administered medications for this visit.    Past Medical History  Diagnosis Date  . Chest pain   . Anxiety   . PSVT (paroxysmal supraventricular tachycardia)     Documented by event recorder  . Cervical spine disease     Mild  . Abnormal Pap smear   . Rectocele 03/03/2013  . H/O bilateral breast implants 03/03/2013  . Elevated cholesterol   . Vaginal dryness, menopausal 12/06/2013  . Hot flashes 12/06/2013  . Menopausal symptoms 12/06/2013  . Ganglion  cyst of left foot 12/06/2013  . Vaginal discharge 02/24/2014  . Yeast infection 02/24/2014  . Vaginal Pap smear, abnormal   . RUQ pain 03/07/2014  . Back pain 03/07/2014  . Hematuria 03/28/2014  . Hypertension     Past Surgical History  Procedure Laterality Date  . Breast enhancement surgery    . Toe biopsy      ROS: Review of systems complete and found to be negative unless listed above  PHYSICAL EXAM BP 122/70  Pulse 65  Ht 5\' 6"  (1.676 m)  Wt 114 lb (51.71 kg)  BMI 18.41 kg/m2  SpO2 97% General: Well developed, well nourished, in no acute distress Head: Eyes PERRLA, No xanthomas.   Normal cephalic and atramatic  Lungs: Clear bilaterally to auscultation and percussion. Heart: HRRR S1 S2, without MRG.  Pulses are 2+ & equal.            No carotid bruit. No JVD.  No abdominal bruits. No femoral bruits. Abdomen: Bowel sounds are positive, abdomen soft and non-tender without masses or                  Hernia's noted. Msk:  Back normal, normal gait. Normal strength and tone for age. Extremities: No clubbing, cyanosis or edema.  DP +1 Neuro: Alert and oriented X 3. Psych:  Good affect, responds appropriately, anxious needing reassurance  ASSESSMENT AND PLAN

## 2014-04-03 NOTE — Assessment & Plan Note (Signed)
This is a significant factor in her overall health status, and self perception of symptoms. I have given reassurance concerning her blood pressure and her heart rate. She will be seen again in one year unless symptomatic. She may need referral to psychiatry at the discretion of her primary care should this be necessary.

## 2014-04-03 NOTE — Patient Instructions (Signed)
Your physician recommends that you schedule a follow-up appointment in: 1 year with Dr Branch You will receive a reminder letter two months in advance reminding you to call and schedule your appointment. If you don't receive this letter, please contact our office.  Your physician recommends that you continue on your current medications as directed. Please refer to the Current Medication list given to you today.   

## 2014-04-10 DIAGNOSIS — R209 Unspecified disturbances of skin sensation: Secondary | ICD-10-CM | POA: Diagnosis not present

## 2014-04-10 DIAGNOSIS — Z79899 Other long term (current) drug therapy: Secondary | ICD-10-CM | POA: Diagnosis not present

## 2014-04-10 DIAGNOSIS — M545 Low back pain, unspecified: Secondary | ICD-10-CM | POA: Diagnosis not present

## 2014-04-10 DIAGNOSIS — M5412 Radiculopathy, cervical region: Secondary | ICD-10-CM | POA: Diagnosis not present

## 2014-04-10 DIAGNOSIS — M5137 Other intervertebral disc degeneration, lumbosacral region: Secondary | ICD-10-CM | POA: Diagnosis not present

## 2014-04-10 DIAGNOSIS — G894 Chronic pain syndrome: Secondary | ICD-10-CM | POA: Diagnosis not present

## 2014-04-17 ENCOUNTER — Telehealth: Payer: Self-pay | Admitting: Adult Health

## 2014-04-17 NOTE — Telephone Encounter (Signed)
Pt informed of normal NM hepato scan results.

## 2014-04-18 ENCOUNTER — Telehealth: Payer: Self-pay | Admitting: Adult Health

## 2014-04-18 NOTE — Telephone Encounter (Signed)
Left message x 1. JSY 

## 2014-04-18 NOTE — Telephone Encounter (Signed)
Spoke with pt. Pt states she has been having stomach pain. Pt states she has been drinking silk almond milk and it had mold all in the carton. The date was still good on the carton. Pt wonders if this could have caused her stomach pain. I spoke with [name] and she advised pt to call Dr. Deberah Castle, which is her GI doc. Pt voiced understanding. April Knox

## 2014-04-20 ENCOUNTER — Telehealth (INDEPENDENT_AMBULATORY_CARE_PROVIDER_SITE_OTHER): Payer: Self-pay | Admitting: Internal Medicine

## 2014-04-20 NOTE — Telephone Encounter (Signed)
Called today stating she had drank Almond Milk with Mold. I advised her to take the milk cartoon to [facility] to let them analysis it. She was also treated for a UTI 2 days ago. Started on Nitrofurantoin.

## 2014-04-25 ENCOUNTER — Ambulatory Visit (INDEPENDENT_AMBULATORY_CARE_PROVIDER_SITE_OTHER): Payer: Medicare Other | Admitting: Adult Health

## 2014-04-25 ENCOUNTER — Encounter: Payer: Self-pay | Admitting: Adult Health

## 2014-04-25 VITALS — BP 138/80 | Ht 66.0 in | Wt 116.5 lb

## 2014-04-25 DIAGNOSIS — R030 Elevated blood-pressure reading, without diagnosis of hypertension: Secondary | ICD-10-CM

## 2014-04-25 DIAGNOSIS — IMO0001 Reserved for inherently not codable concepts without codable children: Secondary | ICD-10-CM

## 2014-04-25 NOTE — Progress Notes (Signed)
Subjective:     [name], female   DOB: March 24, 1956, 58 y.o.   MRN: [id number]  HPI April Knox is a 58 year old white female in for BP check,she has had episodes of BP being elevated.She has taken random readings and they have been normal.She keeps a log.and she has been seen at cardiologist and BP was normal..I think she worries about things and gets worked up and she works 2 jobs and does not get much sleep.  Review of Systems See HPI Reviewed past medical,surgical, social and family history. Reviewed medications and allergies.     Objective:   Physical Exam BP 138/80  Ht 5\' 6"  (1.676 m)  Wt 116 lb 8 oz (52.844 kg)  BMI 18.81 kg/m2   BP on arrival was 158/82 then 140/80,talked with her about try to get more sleep and try not to think about things too much or worry.  Assessment:     Elevated BP    Plan:     Follow up prn Get more sleep and take time for self

## 2014-04-25 NOTE — Patient Instructions (Addendum)
Follow up prn  Get more sleep Reassured BP OK

## 2014-04-26 ENCOUNTER — Telehealth: Payer: Self-pay | Admitting: Adult Health

## 2014-04-26 NOTE — Telephone Encounter (Signed)
Spoke with pt. Pt states her BP today was 122/78. Pt states she will not come here any more for BP checks because it's always high. [place]

## 2014-05-11 DIAGNOSIS — Z79899 Other long term (current) drug therapy: Secondary | ICD-10-CM | POA: Diagnosis not present

## 2014-05-11 DIAGNOSIS — M545 Low back pain, unspecified: Secondary | ICD-10-CM | POA: Diagnosis not present

## 2014-05-11 DIAGNOSIS — M542 Cervicalgia: Secondary | ICD-10-CM | POA: Diagnosis not present

## 2014-05-11 DIAGNOSIS — M5412 Radiculopathy, cervical region: Secondary | ICD-10-CM | POA: Diagnosis not present

## 2014-05-11 DIAGNOSIS — R209 Unspecified disturbances of skin sensation: Secondary | ICD-10-CM | POA: Diagnosis not present

## 2014-05-11 DIAGNOSIS — M5137 Other intervertebral disc degeneration, lumbosacral region: Secondary | ICD-10-CM | POA: Diagnosis not present

## 2014-05-11 DIAGNOSIS — G894 Chronic pain syndrome: Secondary | ICD-10-CM | POA: Diagnosis not present

## 2014-06-01 ENCOUNTER — Telehealth: Payer: Self-pay | Admitting: Adult Health

## 2014-06-01 NOTE — Telephone Encounter (Signed)
I told her I did not know the cream and as for black cohosh it may help and may not and it has some side effects

## 2014-06-01 NOTE — Telephone Encounter (Signed)
Spoke with pt. Pt states she is having a few hot flashes, but they are not bad. Pt also states her emotions are different, she feels anxious. She is wondering if she would benefit from taking [facility] or from usingTrogenterone cream. Please advise. Thanks!!! [employer]

## 2014-06-07 ENCOUNTER — Encounter (INDEPENDENT_AMBULATORY_CARE_PROVIDER_SITE_OTHER): Payer: Self-pay | Admitting: *Deleted

## 2014-06-08 DIAGNOSIS — M545 Low back pain: Secondary | ICD-10-CM | POA: Diagnosis not present

## 2014-06-08 DIAGNOSIS — Z79891 Long term (current) use of opiate analgesic: Secondary | ICD-10-CM | POA: Diagnosis not present

## 2014-06-08 DIAGNOSIS — M546 Pain in thoracic spine: Secondary | ICD-10-CM | POA: Diagnosis not present

## 2014-06-08 DIAGNOSIS — M542 Cervicalgia: Secondary | ICD-10-CM | POA: Diagnosis not present

## 2014-06-22 DIAGNOSIS — N390 Urinary tract infection, site not specified: Secondary | ICD-10-CM | POA: Diagnosis not present

## 2014-06-27 DIAGNOSIS — N3000 Acute cystitis without hematuria: Secondary | ICD-10-CM | POA: Diagnosis not present

## 2014-06-27 DIAGNOSIS — I10 Essential (primary) hypertension: Secondary | ICD-10-CM | POA: Diagnosis not present

## 2014-07-03 ENCOUNTER — Encounter: Payer: Self-pay | Admitting: Adult Health

## 2014-07-04 ENCOUNTER — Telehealth: Payer: Self-pay | Admitting: *Deleted

## 2014-07-04 NOTE — Telephone Encounter (Signed)
Spoke with pt. Pt states she feels bloated and is hurting in her stomach. I advised she needs to see her GI dr. I spoke with JAG and she said she could make an appt here but would probably need to see GI doc. Pt states she will call GI dr. Elwyn Knox

## 2014-07-05 DIAGNOSIS — F329 Major depressive disorder, single episode, unspecified: Secondary | ICD-10-CM | POA: Diagnosis not present

## 2014-07-07 DIAGNOSIS — N390 Urinary tract infection, site not specified: Secondary | ICD-10-CM | POA: Diagnosis not present

## 2014-07-10 DIAGNOSIS — M25511 Pain in right shoulder: Secondary | ICD-10-CM | POA: Diagnosis not present

## 2014-07-10 DIAGNOSIS — G8929 Other chronic pain: Secondary | ICD-10-CM | POA: Diagnosis not present

## 2014-07-10 DIAGNOSIS — M25512 Pain in left shoulder: Secondary | ICD-10-CM | POA: Diagnosis not present

## 2014-07-10 DIAGNOSIS — M546 Pain in thoracic spine: Secondary | ICD-10-CM | POA: Diagnosis not present

## 2014-07-17 ENCOUNTER — Encounter: Payer: Self-pay | Admitting: Obstetrics and Gynecology

## 2014-07-17 ENCOUNTER — Ambulatory Visit (INDEPENDENT_AMBULATORY_CARE_PROVIDER_SITE_OTHER): Payer: Medicare Other | Admitting: Obstetrics and Gynecology

## 2014-07-17 VITALS — BP 130/82 | Ht 66.0 in | Wt 118.0 lb

## 2014-07-17 DIAGNOSIS — F419 Anxiety disorder, unspecified: Secondary | ICD-10-CM | POA: Diagnosis not present

## 2014-07-17 DIAGNOSIS — N952 Postmenopausal atrophic vaginitis: Secondary | ICD-10-CM | POA: Diagnosis not present

## 2014-07-17 NOTE — Progress Notes (Signed)
[name], female   DOB: 11-14-1955, 58 y.o.   MRN: [id number]   April Knox Visit  Patient name: April Knox MRN [id number]  Date of birth: 22-Jun-1956  CC & HPI:  April Knox is a 58 y.o. female presenting today for lower abdominal cramping with associated spotting that started at 3:30 PM yesterday afternoon.  She is not taking any hormones and has not had a menses in years.  She is not as sexually active as she would like to be.  She is currently working 15 hours a day six days a week at two jobs.  She only sleeps 3-5 hours a night and on her day off she takes care of her grandchild and does household chores.   Pt with slight cramping at time  Of lite spotting yesterday, x 1 wiping. None since Pap's normal. raresly sexually active.   ROS:  All systems have been reviewed and are negative unless otherwise indicated in the HPI.  Pertinent History Reviewed:   Reviewed: Significant for  Medical         Past Medical History  Diagnosis Date  . Chest pain   . Anxiety   . PSVT (paroxysmal supraventricular tachycardia)     Documented by event recorder  . Cervical spine disease     Mild  . Abnormal Pap smear   . Rectocele 03/03/2013  . H/O bilateral breast implants 03/03/2013  . Elevated cholesterol   . Vaginal dryness, menopausal 12/06/2013  . Hot flashes 12/06/2013  . Menopausal symptoms 12/06/2013  . Ganglion cyst of left foot 12/06/2013  . Vaginal discharge 02/24/2014  . Yeast infection 02/24/2014  . Vaginal Pap smear, abnormal   . RUQ pain 03/07/2014  . Back pain 03/07/2014  . Hematuria 03/28/2014  . Hypertension                               Surgical Hx:    Past Surgical History  Procedure Laterality Date  . Breast enhancement surgery    . Toe biopsy     Medications: Reviewed & Updated - see associated section                      Current outpatient prescriptions: cetirizine (ZYRTEC) 10 MG tablet, Take 10 mg by mouth as needed., Disp: , Rfl: ;  Multiple  Vitamin (MULTIVITAMIN) tablet, Take 1 tablet by mouth daily.  , Disp: , Rfl:    Social History: Reviewed -  reports that she has never smoked. She has never used smokeless tobacco.  Objective Findings:  Vitals: Blood pressure 162/80, height 5\' 6"  (1.676 m), weight 118 lb (53.524 kg).           Recheck. 130/82 Physical Examination: General appearance - alert, well appearing, and in no distress, oriented to person, place, and time and normal appearing weight Pelvic - VULVA: normal appearing vulva with no masses, tenderness or lesions,  VAGINA: normal appearing vagina with normal color and discharge, no lesions,  CERVIX: normal appearing cervix without discharge or lesions,  UTERUS: uterus is normal size, shape, consistency and nontender, 4.7cm long by 2.2cm and 3.1cm transverse by TV u/s done by me. ADNEXA: normal adnexa in size, nontender and no masses, and negative by u/s  Assessment & Plan:   A:  1. Atrophic vaginal tissues, postmenopausal 2. Thin endometrium on u/s 3. Anxiety, mild HTN due to anxiety(white coat  htn)  P:  1. reasssure pt of normalcy of pelvic u/s/ 2. F/u [name] prn anxiety/concerns. Let us know of recurrent bleeding, we would consider endometrial bx  This chart was scribed for Jonnie Kind, MD by Donato Schultz, ED Scribe. This patient was seen in Room 2 and the patient's care was started at 4:40 PM.

## 2014-07-17 NOTE — Progress Notes (Signed)
[name], female   DOB: 08-12-1956, 58 y.o.   MRN: [id number] Pt here today for bloating and pain/crramping in her stomach, and an odor and light discharge. Pt states that she just doesn't know what's going on.

## 2014-07-19 DIAGNOSIS — L570 Actinic keratosis: Secondary | ICD-10-CM | POA: Diagnosis not present

## 2014-07-19 DIAGNOSIS — Z85828 Personal history of other malignant neoplasm of skin: Secondary | ICD-10-CM | POA: Diagnosis not present

## 2014-07-20 ENCOUNTER — Encounter (INDEPENDENT_AMBULATORY_CARE_PROVIDER_SITE_OTHER): Payer: Self-pay | Admitting: Internal Medicine

## 2014-07-20 ENCOUNTER — Ambulatory Visit (INDEPENDENT_AMBULATORY_CARE_PROVIDER_SITE_OTHER): Payer: Medicare Other | Admitting: Internal Medicine

## 2014-07-20 ENCOUNTER — Encounter (INDEPENDENT_AMBULATORY_CARE_PROVIDER_SITE_OTHER): Payer: Self-pay | Admitting: *Deleted

## 2014-07-20 VITALS — BP 112/62 | HR 72 | Temp 98.0°F | Ht 66.0 in | Wt 117.3 lb

## 2014-07-20 DIAGNOSIS — R140 Abdominal distension (gaseous): Secondary | ICD-10-CM

## 2014-07-20 NOTE — Patient Instructions (Signed)
NM Gastric emptying study.  Further recommendation to follow

## 2014-07-20 NOTE — Progress Notes (Signed)
   Subjective:    [name], female    DOB: [birth year], 58 y.o.   MRN: [id number]  HPI C/i epigastric pain off and on for a month. Not related to eating.  She says when she eats it feels like foods are sitting in her stomach. She says she has lost weight. She has actually gained 2 pounds since her last visit in July. She has a bloated feeling.  Started [employer] for gas, bloating and indigestion. She thinks it has helped. She does not have epigastric pain daily.  She does not have acid reflux. Appetite is okay.  She is eating one meal a day but does have snacks during the day. She has a BM daily. Stools are ball like and sometimes normal.  No nausea or vomiting. No fever. No NSAIDs.      Review of Systems Past Medical History  Diagnosis Date  . Chest pain   . Anxiety   . PSVT (paroxysmal supraventricular tachycardia)     Documented by event recorder  . Cervical spine disease     Mild  . Abnormal Pap smear   . Rectocele 03/03/2013  . H/O bilateral breast implants 03/03/2013  . Elevated cholesterol   . Vaginal dryness, menopausal 12/06/2013  . Hot flashes 12/06/2013  . Menopausal symptoms 12/06/2013  . Ganglion cyst of left foot 12/06/2013  . Vaginal discharge 02/24/2014  . Yeast infection 02/24/2014  . Vaginal Pap smear, abnormal   . RUQ pain 03/07/2014  . Back pain 03/07/2014  . Hematuria 03/28/2014  . Hypertension     Past Surgical History  Procedure Laterality Date  . Breast enhancement surgery    . Toe biopsy      Allergies  Allergen Reactions  . Bactrim Ds [Sulfamethoxazole-Trimethoprim]     Hives   . Ciprocinonide [Fluocinolone] Hives    " made me feel awful"   . Penicillins   . Sulfa Antibiotics Hives, Diarrhea, Nausea Only and Rash    Current Outpatient Prescriptions on File Prior to Visit  Medication Sig Dispense Refill  . cetirizine (ZYRTEC) 10 MG tablet Take 10 mg by mouth as needed.    . Multiple Vitamin (MULTIVITAMIN) tablet Take 1  tablet by mouth daily.       No current facility-administered medications on file prior to visit.        Objective:   Physical Exam Filed Vitals:   07/20/14 1000  Height: 5\' 6"  (1.676 m)  Weight: 117 lb 4.8 oz (53.207 kg)   Alert and oriented. Skin warm and dry. Oral mucosa is moist.   . Sclera anicteric, conjunctivae is pink. Thyroid not enlarged. No cervical lymphadenopathy. Lungs clear. Heart regular rate and rhythm.  Abdomen is soft. Bowel sounds are positive. No hepatomegaly. No abdominal masses felt. No tenderness.  No edema to lower extremities.       Assessment & Plan:  Bloating, epigastric discomfort. Really no acid reflux. Will get a gastric emptying study. Further recommendation to follow.

## 2014-07-21 ENCOUNTER — Ambulatory Visit (INDEPENDENT_AMBULATORY_CARE_PROVIDER_SITE_OTHER): Payer: Medicare Other | Admitting: Internal Medicine

## 2014-07-24 ENCOUNTER — Ambulatory Visit (HOSPITAL_COMMUNITY): Payer: Medicare Other

## 2014-07-24 DIAGNOSIS — H43391 Other vitreous opacities, right eye: Secondary | ICD-10-CM | POA: Diagnosis not present

## 2014-07-26 DIAGNOSIS — G43109 Migraine with aura, not intractable, without status migrainosus: Secondary | ICD-10-CM | POA: Diagnosis not present

## 2014-08-03 ENCOUNTER — Encounter (HOSPITAL_COMMUNITY)
Admission: RE | Admit: 2014-08-03 | Discharge: 2014-08-03 | Disposition: A | Discharge status: Home / Self Care | Payer: Medicare Other | Source: Ambulatory Visit | Attending: Internal Medicine | Admitting: Internal Medicine

## 2014-08-03 ENCOUNTER — Encounter (HOSPITAL_COMMUNITY): Payer: Self-pay

## 2014-08-03 DIAGNOSIS — R140 Abdominal distension (gaseous): Secondary | ICD-10-CM | POA: Insufficient documentation

## 2014-08-03 MED ORDER — TECHNETIUM TC 99M SULFUR COLLOID
2.0000 | Freq: Once | INTRAVENOUS | Status: AC | PRN
  Administered 2014-08-03: 2 via ORAL

## 2014-08-10 DIAGNOSIS — M25511 Pain in right shoulder: Secondary | ICD-10-CM | POA: Diagnosis not present

## 2014-08-10 DIAGNOSIS — M545 Low back pain: Secondary | ICD-10-CM | POA: Diagnosis not present

## 2014-08-10 DIAGNOSIS — G8929 Other chronic pain: Secondary | ICD-10-CM | POA: Diagnosis not present

## 2014-08-10 DIAGNOSIS — M546 Pain in thoracic spine: Secondary | ICD-10-CM | POA: Diagnosis not present

## 2014-08-10 DIAGNOSIS — M25512 Pain in left shoulder: Secondary | ICD-10-CM | POA: Diagnosis not present

## 2014-08-10 DIAGNOSIS — Z79891 Long term (current) use of opiate analgesic: Secondary | ICD-10-CM | POA: Diagnosis not present

## 2014-08-23 DIAGNOSIS — R29810 Facial weakness: Secondary | ICD-10-CM | POA: Diagnosis not present

## 2014-08-23 DIAGNOSIS — R51 Headache: Secondary | ICD-10-CM | POA: Diagnosis not present

## 2014-08-23 DIAGNOSIS — G459 Transient cerebral ischemic attack, unspecified: Secondary | ICD-10-CM | POA: Diagnosis not present

## 2014-08-29 ENCOUNTER — Telehealth: Payer: Self-pay | Admitting: Adult Health

## 2014-08-29 NOTE — Telephone Encounter (Signed)
Has numerous complaints such as headache and floaters right nasal congestion since thanksgiving, saw dr Zada Girt and had CT of head was negative, also saw eye doctor and that was fine, may want to see ENT, number given

## 2014-09-04 DIAGNOSIS — G43109 Migraine with aura, not intractable, without status migrainosus: Secondary | ICD-10-CM | POA: Diagnosis not present

## 2014-09-04 DIAGNOSIS — R040 Epistaxis: Secondary | ICD-10-CM | POA: Diagnosis not present

## 2014-09-11 DIAGNOSIS — M546 Pain in thoracic spine: Secondary | ICD-10-CM | POA: Diagnosis not present

## 2014-09-11 DIAGNOSIS — M542 Cervicalgia: Secondary | ICD-10-CM | POA: Diagnosis not present

## 2014-09-11 DIAGNOSIS — G8929 Other chronic pain: Secondary | ICD-10-CM | POA: Diagnosis not present

## 2014-09-11 DIAGNOSIS — M25511 Pain in right shoulder: Secondary | ICD-10-CM | POA: Diagnosis not present

## 2014-09-11 DIAGNOSIS — M25512 Pain in left shoulder: Secondary | ICD-10-CM | POA: Diagnosis not present

## 2014-09-20 DIAGNOSIS — G43109 Migraine with aura, not intractable, without status migrainosus: Secondary | ICD-10-CM | POA: Diagnosis not present

## 2014-09-20 DIAGNOSIS — H43391 Other vitreous opacities, right eye: Secondary | ICD-10-CM | POA: Diagnosis not present

## 2014-09-25 ENCOUNTER — Ambulatory Visit (INDEPENDENT_AMBULATORY_CARE_PROVIDER_SITE_OTHER): Payer: Medicare Other | Admitting: Internal Medicine

## 2014-10-05 ENCOUNTER — Ambulatory Visit: Payer: Medicare Other | Admitting: Adult Health

## 2014-10-09 ENCOUNTER — Ambulatory Visit (INDEPENDENT_AMBULATORY_CARE_PROVIDER_SITE_OTHER): Payer: Medicare Other | Admitting: Adult Health

## 2014-10-09 ENCOUNTER — Encounter: Payer: Self-pay | Admitting: Adult Health

## 2014-10-09 VITALS — BP 154/70 | Ht 66.0 in | Wt 125.5 lb

## 2014-10-09 DIAGNOSIS — B9689 Other specified bacterial agents as the cause of diseases classified elsewhere: Secondary | ICD-10-CM | POA: Insufficient documentation

## 2014-10-09 DIAGNOSIS — N898 Other specified noninflammatory disorders of vagina: Secondary | ICD-10-CM | POA: Diagnosis not present

## 2014-10-09 DIAGNOSIS — A499 Bacterial infection, unspecified: Secondary | ICD-10-CM

## 2014-10-09 DIAGNOSIS — N760 Acute vaginitis: Secondary | ICD-10-CM

## 2014-10-09 LAB — POCT URINALYSIS DIPSTICK
Blood, UA: NEGATIVE
GLUCOSE UA: NEGATIVE
Leukocytes, UA: NEGATIVE
Nitrite, UA: NEGATIVE
Protein, UA: NEGATIVE

## 2014-10-09 LAB — POCT WET PREP (WET MOUNT): WBC WET PREP: POSITIVE

## 2014-10-09 MED ORDER — METRONIDAZOLE 500 MG PO TABS: 500.0000 mg | ORAL_TABLET | Freq: Two times a day (BID) | ORAL | Status: DC

## 2014-10-09 NOTE — Progress Notes (Signed)
Subjective:     [name], female   DOB: 1956/01/02, 59 y.o.   MRN: [id number]  HPI April Knox is a 59 year old white female in complaining of vaginal discharge with odor and at times and stomach hurts.No new sex partner.  Review of Systems Patient denies any headaches, hearing loss, fatigue, blurred vision, shortness of breath, chest pain, problems with bowel movements, urination, or intercourse. No joint pain or mood swings, see HPI for positives.BP normal at home.    Objective:   Physical Exam BP 154/70 mmHg  Ht 5\' 6"  (1.676 m)  Wt 125 lb 8 oz (56.926 kg)  BMI 20.27 kg/m2   urine negative, Skin warm and dry.Pelvic: external genitalia is normal in appearance no lesions, vagina: white discharge with odor,urethra has no lesions or masses noted, cervix:smooth and bulbous, uterus: normal size, shape and contour, non tender, no masses felt, adnexa: no masses or tenderness noted. Bladder is non tender and no masses felt. Wet prep: + for clue cells and +WBCs.  Assessment:     Vaginal discharge BV    Plan:    Call if not better Check BP at home Rx flagyl 500 mg 1 bid x 7 days, no alcohol, review handout on BV   Follow up prn

## 2014-10-09 NOTE — Patient Instructions (Signed)
Bacterial Vaginosis Bacterial vaginosis is a vaginal infection that occurs when the normal balance of bacteria in the vagina is disrupted. It results from an overgrowth of certain bacteria. This is the most common vaginal infection in women of childbearing age. Treatment is important to prevent complications, especially in pregnant women, as it can cause a premature delivery. CAUSES  Bacterial vaginosis is caused by an increase in harmful bacteria that are normally present in smaller amounts in the vagina. Several different kinds of bacteria can cause bacterial vaginosis. However, the reason that the condition develops is not fully understood. RISK FACTORS Certain activities or behaviors can put you at an increased risk of developing bacterial vaginosis, including:  Having a new sex partner or multiple sex partners.  Douching.  Using an intrauterine device (IUD) for contraception. Women do not get bacterial vaginosis from toilet seats, bedding, swimming pools, or contact with objects around them. SIGNS AND SYMPTOMS  Some women with bacterial vaginosis have no signs or symptoms. Common symptoms include:  Grey vaginal discharge.  A fishlike odor with discharge, especially after sexual intercourse.  Itching or burning of the vagina and vulva.  Burning or pain with urination. DIAGNOSIS  Your health care provider will take a medical history and examine the vagina for signs of bacterial vaginosis. A sample of vaginal fluid may be taken. Your health care provider will look at this sample under a microscope to check for bacteria and abnormal cells. A vaginal pH test may also be done.  TREATMENT  Bacterial vaginosis may be treated with antibiotic medicines. These may be given in the form of a pill or a vaginal cream. A second round of antibiotics may be prescribed if the condition comes back after treatment.  HOME CARE INSTRUCTIONS   Only take over-the-counter or prescription medicines as  directed by your health care provider.  If antibiotic medicine was prescribed, take it as directed. Make sure you finish it even if you start to feel better.  Do not have sex until treatment is completed.  Tell all sexual partners that you have a vaginal infection. They should see their health care provider and be treated if they have problems, such as a mild rash or itching.  Practice safe sex by using condoms and only having one sex partner. SEEK MEDICAL CARE IF:   Your symptoms are not improving after 3 days of treatment.  You have increased discharge or pain.  You have a fever. MAKE SURE YOU:   Understand these instructions.  Will watch your condition.  Will get help right away if you are not doing well or get worse. FOR MORE INFORMATION  Centers for Disease Control and Prevention, Division of STD Prevention: AppraiserFraud[employer]fi American Sexual Health Association (ASHA): www.ashastd.org  Document Released: 08/18/2005 Document Revised: 06/08/2013 Document Reviewed: 03/30/2013 [facility] Patient Information 2015 [place], [place]. This information is not intended to replace advice given to you by your health care provider. Make sure you discuss any questions you have with your health care provider. No alcohol  Follow up prn

## 2014-10-11 ENCOUNTER — Telehealth: Payer: Self-pay | Admitting: *Deleted

## 2014-10-11 NOTE — Telephone Encounter (Signed)
Spoke with pt. Pt states she has had a headache, off and on, since Thanksgiving. It's affecting her vision. Can you refer her to a specialist? Thanks!! [place]

## 2014-10-12 NOTE — Telephone Encounter (Signed)
Left message needs to get PCP to refer,

## 2014-10-13 DIAGNOSIS — Z136 Encounter for screening for cardiovascular disorders: Secondary | ICD-10-CM | POA: Diagnosis not present

## 2014-10-17 DIAGNOSIS — M25511 Pain in right shoulder: Secondary | ICD-10-CM | POA: Diagnosis not present

## 2014-10-17 DIAGNOSIS — M25512 Pain in left shoulder: Secondary | ICD-10-CM | POA: Diagnosis not present

## 2014-10-17 DIAGNOSIS — G8929 Other chronic pain: Secondary | ICD-10-CM | POA: Diagnosis not present

## 2014-10-17 DIAGNOSIS — M546 Pain in thoracic spine: Secondary | ICD-10-CM | POA: Diagnosis not present

## 2014-10-17 DIAGNOSIS — M542 Cervicalgia: Secondary | ICD-10-CM | POA: Diagnosis not present

## 2014-10-17 DIAGNOSIS — G603 Idiopathic progressive neuropathy: Secondary | ICD-10-CM | POA: Diagnosis not present

## 2014-10-30 ENCOUNTER — Telehealth (INDEPENDENT_AMBULATORY_CARE_PROVIDER_SITE_OTHER): Payer: Self-pay | Admitting: *Deleted

## 2014-10-30 ENCOUNTER — Telehealth: Payer: Self-pay | Admitting: *Deleted

## 2014-10-30 ENCOUNTER — Encounter (INDEPENDENT_AMBULATORY_CARE_PROVIDER_SITE_OTHER): Payer: Self-pay | Admitting: *Deleted

## 2014-10-30 NOTE — Telephone Encounter (Signed)
Noted  

## 2014-10-30 NOTE — Telephone Encounter (Signed)
[place] No Showed for her apt with Dr. Laural Golden on 09/25/14. A NS letter has been mailed.

## 2014-10-30 NOTE — Telephone Encounter (Signed)
Spoke with pt. Pt states urine is yellow since finishing Flagyl. I advised she would need to have her urine checked. Pt wants to go by Dr. Ralph Dowdy office and have it checked. I advised that would be fine.[place]

## 2014-10-31 DIAGNOSIS — L0100 Impetigo, unspecified: Secondary | ICD-10-CM | POA: Diagnosis not present

## 2014-11-06 DIAGNOSIS — Z9882 Breast implant status: Secondary | ICD-10-CM | POA: Diagnosis not present

## 2014-11-06 DIAGNOSIS — Z1231 Encounter for screening mammogram for malignant neoplasm of breast: Secondary | ICD-10-CM | POA: Diagnosis not present

## 2014-11-14 DIAGNOSIS — G8929 Other chronic pain: Secondary | ICD-10-CM | POA: Diagnosis not present

## 2014-11-14 DIAGNOSIS — M25512 Pain in left shoulder: Secondary | ICD-10-CM | POA: Diagnosis not present

## 2014-11-14 DIAGNOSIS — M25511 Pain in right shoulder: Secondary | ICD-10-CM | POA: Diagnosis not present

## 2014-11-14 DIAGNOSIS — M546 Pain in thoracic spine: Secondary | ICD-10-CM | POA: Diagnosis not present

## 2014-11-14 DIAGNOSIS — M542 Cervicalgia: Secondary | ICD-10-CM | POA: Diagnosis not present

## 2014-11-21 DIAGNOSIS — G43909 Migraine, unspecified, not intractable, without status migrainosus: Secondary | ICD-10-CM | POA: Diagnosis not present

## 2014-12-04 DIAGNOSIS — G43109 Migraine with aura, not intractable, without status migrainosus: Secondary | ICD-10-CM | POA: Diagnosis not present

## 2014-12-04 DIAGNOSIS — Z0000 Encounter for general adult medical examination without abnormal findings: Secondary | ICD-10-CM | POA: Diagnosis not present

## 2014-12-04 DIAGNOSIS — Z1389 Encounter for screening for other disorder: Secondary | ICD-10-CM | POA: Diagnosis not present

## 2014-12-04 DIAGNOSIS — Z23 Encounter for immunization: Secondary | ICD-10-CM | POA: Diagnosis not present

## 2014-12-06 ENCOUNTER — Encounter: Payer: Self-pay | Admitting: Obstetrics and Gynecology

## 2014-12-20 DIAGNOSIS — H1131 Conjunctival hemorrhage, right eye: Secondary | ICD-10-CM | POA: Diagnosis not present

## 2015-01-25 ENCOUNTER — Ambulatory Visit: Payer: Medicare Other | Admitting: Adult Health

## 2015-01-30 ENCOUNTER — Telehealth: Payer: Self-pay | Admitting: Adult Health

## 2015-01-30 MED ORDER — MUPIROCIN CALCIUM 2 % EX CREA: 1.0000 "application " | TOPICAL_CREAM | Freq: Two times a day (BID) | CUTANEOUS | Status: DC

## 2015-01-30 NOTE — Telephone Encounter (Signed)
Spoke with pt. Pt states she got a tick bite yesterday. She ran out of Bactroban cream. Her primary dr and dermatologist is out of town until Friday. Can you order cream? Thanks!! [place]

## 2015-01-30 NOTE — Telephone Encounter (Signed)
Will rx bactorban

## 2015-02-01 DIAGNOSIS — A681 Tick-borne relapsing fever: Secondary | ICD-10-CM | POA: Diagnosis not present

## 2015-02-01 DIAGNOSIS — L84 Corns and callosities: Secondary | ICD-10-CM | POA: Diagnosis not present

## 2015-02-03 IMAGING — NM NM HEPATO W/GB/PHARM/[PERSON_NAME]
2 series · 12 of 12 positions shown · non-contrast
Comparison: None.

CLINICAL DATA: Right upper quadrant pain

EXAM:
NUCLEAR MEDICINE HEPATOBILIARY IMAGING WITH GALLBLADDER EF
TECHNIQUE: Sequential images of the abdomen were obtained [DATE] minutes
following intravenous administration of radiopharmaceutical. After
slow intravenous infusion of 1.08 micrograms Cholecystokinin,
gallbladder ejection fraction was determined.
RADIOPHARMACEUTICALS:  5 Millicurie 0c-IIm Choletec

[Series 1: biliary · 3.25mm/px · 6 of 60 frames shown]
[frame 6/60]
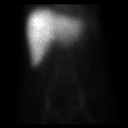
[frame 16/60]
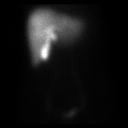
[frame 26/60]
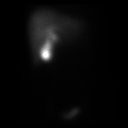
[frame 36/60]
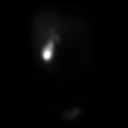
[frame 46/60]
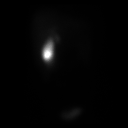
[frame 56/60]
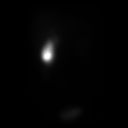

[Series 2: gbef · 3.25mm/px · 6 of 30 frames shown]
[frame 3/30]
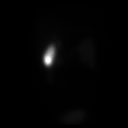
[frame 8/30]
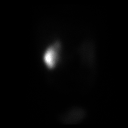
[frame 13/30]
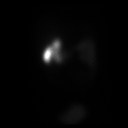
[frame 18/30]
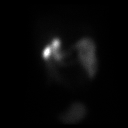
[frame 23/30]
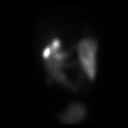
[frame 28/30]
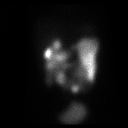

[12 of 12 positions shown; findings below may reference images not displayed]

FINDINGS: There is immediate homogeneous uptake of radiotracer in the liver.
Filling of the gallbladder begins at 10 minutes. Radiotracer uptake
is present in the small bowel at 45 minutes.

At the end of 1-hour, there is relatively good clearance of activity
from the liver.

When gallbladder filling was complete, the patient was given an IV
infusion of CCK. The gallbladder ejection fraction measures 83%. At
30 min, normal ejection fraction is greater than 30%.

The patient did experience symptoms during CCK infusion.
IMPRESSION: 1. Normal hepatobiliary scan.
2. Normal gallbladder ejection fraction.

## 2015-02-07 DIAGNOSIS — Z85828 Personal history of other malignant neoplasm of skin: Secondary | ICD-10-CM | POA: Diagnosis not present

## 2015-02-07 DIAGNOSIS — L570 Actinic keratosis: Secondary | ICD-10-CM | POA: Diagnosis not present

## 2015-02-23 ENCOUNTER — Encounter: Payer: Self-pay | Admitting: Adult Health

## 2015-02-23 ENCOUNTER — Ambulatory Visit (INDEPENDENT_AMBULATORY_CARE_PROVIDER_SITE_OTHER): Payer: Medicare Other | Admitting: Adult Health

## 2015-02-23 VITALS — BP 130/80 | HR 72 | Ht 66.0 in | Wt 126.5 lb

## 2015-02-23 DIAGNOSIS — N898 Other specified noninflammatory disorders of vagina: Secondary | ICD-10-CM

## 2015-02-23 DIAGNOSIS — B379 Candidiasis, unspecified: Secondary | ICD-10-CM

## 2015-02-23 DIAGNOSIS — Z139 Encounter for screening, unspecified: Secondary | ICD-10-CM

## 2015-02-23 DIAGNOSIS — R5383 Other fatigue: Secondary | ICD-10-CM | POA: Diagnosis not present

## 2015-02-23 DIAGNOSIS — N951 Menopausal and female climacteric states: Secondary | ICD-10-CM | POA: Diagnosis not present

## 2015-02-23 LAB — POCT WET PREP (WET MOUNT): WBC WET PREP: POSITIVE

## 2015-02-23 LAB — POCT URINALYSIS DIPSTICK
Blood, UA: NEGATIVE
Glucose, UA: NEGATIVE
LEUKOCYTES UA: NEGATIVE
Nitrite, UA: NEGATIVE
PROTEIN UA: NEGATIVE

## 2015-02-23 MED ORDER — FLUCONAZOLE 150 MG PO TABS: ORAL_TABLET | ORAL | Status: DC

## 2015-02-23 NOTE — Patient Instructions (Signed)
Monilial Vaginitis Vaginitis in a soreness, swelling and redness (inflammation) of the vagina and vulva. Monilial vaginitis is not a sexually transmitted infection. CAUSES  Yeast vaginitis is caused by yeast (candida) that is normally found in your vagina. With a yeast infection, the candida has overgrown in number to a point that upsets the chemical balance. SYMPTOMS   White, thick vaginal discharge.  Swelling, itching, redness and irritation of the vagina and possibly the lips of the vagina (vulva).  Burning or painful urination.  Painful intercourse. DIAGNOSIS  Things that may contribute to monilial vaginitis are:  Postmenopausal and virginal states.  Pregnancy.  Infections.  Being tired, sick or stressed, especially if you had monilial vaginitis in the past.  Diabetes. Good control will help lower the chance.  Birth control pills.  Tight fitting garments.  Using bubble bath, feminine sprays, douches or deodorant tampons.  Taking certain medications that kill germs (antibiotics).  Sporadic recurrence can occur if you become ill. TREATMENT  Your caregiver will give you medication.  There are several kinds of anti monilial vaginal creams and suppositories specific for monilial vaginitis. For recurrent yeast infections, use a suppository or cream in the vagina 2 times a week, or as directed.  Anti-monilial or steroid cream for the itching or irritation of the vulva may also be used. Get your caregiver's permission.  Painting the vagina with methylene blue solution may help if the monilial cream does not work.  Eating yogurt may help prevent monilial vaginitis. HOME CARE INSTRUCTIONS   Finish all medication as prescribed.  Do not have sex until treatment is completed or after your caregiver tells you it is okay.  Take warm sitz baths.  Do not douche.  Do not use tampons, especially scented ones.  Wear cotton underwear.  Avoid tight pants and panty  hose.  Tell your sexual partner that you have a yeast infection. They should go to their caregiver if they have symptoms such as mild rash or itching.  Your sexual partner should be treated as well if your infection is difficult to eliminate.  Practice safer sex. Use condoms.  Some vaginal medications cause latex condoms to fail. Vaginal medications that harm condoms are:  Cleocin cream.  Butoconazole (Femstat).  Terconazole (Terazol) vaginal suppository.  Miconazole (Monistat) (may be purchased over the counter). SEEK MEDICAL CARE IF:   You have a temperature by mouth above 102 F (38.9 C).  The infection is getting worse after 2 days of treatment.  The infection is not getting better after 3 days of treatment.  You develop blisters in or around your vagina.  You develop vaginal bleeding, and it is not your menstrual period.  You have pain when you urinate.  You develop intestinal problems.  You have pain with sexual intercourse. Document Released: 05/28/2005 Document Revised: 11/10/2011 Document Reviewed: 02/09/2009 [facility] Patient Information 2015 [place], [place]. This information is not intended to replace advice given to you by your health care provider. Make sure you discuss any questions you have with your health care provider. Try luvena for vaginal moisture

## 2015-02-23 NOTE — Progress Notes (Signed)
Subjective:     [name], female   DOB: Jun 09, 1956, 59 y.o.   MRN: [id number]  HPI April Knox is a 59 year old white female, divorced in complaining of vaginal discharge, slight itch, low abdominal pain and being tired.Feels dry with sex.  Review of Systems Patient denies any headaches, hearing loss, blurred vision, shortness of breath, chest pain,problems with bowel movements, urination, or intercourse. No joint pain or mood swings.See HPI for positives.  Reviewed past medical,surgical, social and family history. Reviewed medications and allergies.     Objective:   Physical Exam BP 130/80 mmHg  Pulse 72  Ht 5\' 6"  (1.676 m)  Wt 126 lb 8 oz (57.38 kg)  BMI 20.43 kg/m2urine negative, Skin warm and dry.Pelvic: external genitalia is normal in appearance no lesions, vagina:scant white discharge without odor, has decreased color moisture and rugae,urethra has no lesions or masses noted, cervix:smooth, uterus: normal size, shape and contour, non tender, no masses felt, adnexa: no masses or tenderness noted. Bladder is non tender and no masses felt. Wet prep: + WBC and yeast    Assessment:     Vaginal discharge Yeast infection Vaginal dryness, menopausal Fatigue     Plan:     Check CBC,CMP,TSH  Rx diflucan 150 mg#2 take 1 now and 1 in 3 days with 1 refill Try luvena, 3 samples given  Return in 1 year for  Pap and physical  Review handout on yeast

## 2015-02-24 LAB — COMPREHENSIVE METABOLIC PANEL
A/G RATIO: 1.6 (ref 1.1–2.5)
ALBUMIN: 4.4 g/dL (ref 3.5–5.5)
ALT: 14 IU/L (ref 0–32)
AST: 22 IU/L (ref 0–40)
Alkaline Phosphatase: 83 IU/L (ref 39–117)
BUN/Creatinine Ratio: 23 (ref 9–23)
BUN: 12 mg/dL (ref 6–24)
Bilirubin Total: 0.5 mg/dL (ref 0.0–1.2)
CALCIUM: 9.5 mg/dL (ref 8.7–10.2)
CO2: 26 mmol/L (ref 18–29)
CREATININE: 0.52 mg/dL — AB (ref 0.57–1.00)
Chloride: 102 mmol/L (ref 97–108)
GFR calc Af Amer: 121 mL/min/{1.73_m2} (ref >59)
GFR calc non Af Amer: 105 mL/min/{1.73_m2} (ref >59)
Globulin, Total: 2.8 g/dL (ref 1.5–4.5)
Glucose: 104 mg/dL — ABNORMAL HIGH (ref 65–99)
POTASSIUM: 5.3 mmol/L — AB (ref 3.5–5.2)
Sodium: 144 mmol/L (ref 134–144)
TOTAL PROTEIN: 7.2 g/dL (ref 6.0–8.5)

## 2015-02-24 LAB — CBC
Hematocrit: 43.4 % (ref 34.0–46.6)
Hemoglobin: 14.3 g/dL (ref 11.1–15.9)
MCH: 32.1 pg (ref 26.6–33.0)
MCHC: 32.9 g/dL (ref 31.5–35.7)
MCV: 97 fL (ref 79–97)
Platelets: 250 10*3/uL (ref 150–379)
RBC: 4.46 x10E6/uL (ref 3.77–5.28)
RDW: 13.3 % (ref 12.3–15.4)
WBC: 5.8 10*3/uL (ref 3.4–10.8)

## 2015-02-24 LAB — TSH: TSH: 0.792 u[IU]/mL (ref 0.450–4.500)

## 2015-02-26 ENCOUNTER — Telehealth: Payer: Self-pay | Admitting: Adult Health

## 2015-02-26 NOTE — Telephone Encounter (Signed)
Pt aware of labs  

## 2015-03-29 ENCOUNTER — Ambulatory Visit: Payer: Medicare Other | Admitting: Advanced Practice Midwife

## 2015-03-29 DIAGNOSIS — K2900 Acute gastritis without bleeding: Secondary | ICD-10-CM | POA: Diagnosis not present

## 2015-04-03 ENCOUNTER — Encounter: Payer: Self-pay | Admitting: Adult Health

## 2015-04-03 ENCOUNTER — Ambulatory Visit (INDEPENDENT_AMBULATORY_CARE_PROVIDER_SITE_OTHER): Payer: Medicare Other | Admitting: Adult Health

## 2015-04-03 ENCOUNTER — Encounter: Payer: Self-pay | Admitting: Cardiology

## 2015-04-03 ENCOUNTER — Ambulatory Visit (INDEPENDENT_AMBULATORY_CARE_PROVIDER_SITE_OTHER): Payer: Medicare Other | Admitting: Cardiology

## 2015-04-03 VITALS — BP 136/76 | HR 74 | Ht 66.0 in | Wt 122.0 lb

## 2015-04-03 VITALS — BP 140/78 | HR 72 | Ht 66.0 in | Wt 122.5 lb

## 2015-04-03 DIAGNOSIS — R102 Pelvic and perineal pain: Secondary | ICD-10-CM | POA: Diagnosis not present

## 2015-04-03 DIAGNOSIS — B9689 Other specified bacterial agents as the cause of diseases classified elsewhere: Secondary | ICD-10-CM

## 2015-04-03 DIAGNOSIS — N760 Acute vaginitis: Secondary | ICD-10-CM

## 2015-04-03 DIAGNOSIS — Z136 Encounter for screening for cardiovascular disorders: Secondary | ICD-10-CM

## 2015-04-03 DIAGNOSIS — A499 Bacterial infection, unspecified: Secondary | ICD-10-CM

## 2015-04-03 DIAGNOSIS — I471 Supraventricular tachycardia: Secondary | ICD-10-CM | POA: Diagnosis not present

## 2015-04-03 DIAGNOSIS — N898 Other specified noninflammatory disorders of vagina: Secondary | ICD-10-CM | POA: Diagnosis not present

## 2015-04-03 LAB — POCT WET PREP (WET MOUNT): WBC, Wet Prep HPF POC: POSITIVE

## 2015-04-03 LAB — POCT URINALYSIS DIPSTICK
Blood, UA: NEGATIVE
Glucose, UA: NEGATIVE
Leukocytes, UA: NEGATIVE
NITRITE UA: NEGATIVE
PROTEIN UA: NEGATIVE

## 2015-04-03 MED ORDER — METRONIDAZOLE 500 MG PO TABS: 500.0000 mg | ORAL_TABLET | Freq: Two times a day (BID) | ORAL | Status: DC

## 2015-04-03 MED ORDER — METRONIDAZOLE 0.75 % VA GEL: 1.0000 | Freq: Every day | VAGINAL | Status: DC

## 2015-04-03 NOTE — Patient Instructions (Signed)
Bacterial Vaginosis Bacterial vaginosis is a vaginal infection that occurs when the normal balance of bacteria in the vagina is disrupted. It results from an overgrowth of certain bacteria. This is the most common vaginal infection in women of childbearing age. Treatment is important to prevent complications, especially in pregnant women, as it can cause a premature delivery. CAUSES  Bacterial vaginosis is caused by an increase in harmful bacteria that are normally present in smaller amounts in the vagina. Several different kinds of bacteria can cause bacterial vaginosis. However, the reason that the condition develops is not fully understood. RISK FACTORS Certain activities or behaviors can put you at an increased risk of developing bacterial vaginosis, including:  Having a new sex partner or multiple sex partners.  Douching.  Using an intrauterine device (IUD) for contraception. Women do not get bacterial vaginosis from toilet seats, bedding, swimming pools, or contact with objects around them. SIGNS AND SYMPTOMS  Some women with bacterial vaginosis have no signs or symptoms. Common symptoms include:  Grey vaginal discharge.  A fishlike odor with discharge, especially after sexual intercourse.  Itching or burning of the vagina and vulva.  Burning or pain with urination. DIAGNOSIS  Your health care provider will take a medical history and examine the vagina for signs of bacterial vaginosis. A sample of vaginal fluid may be taken. Your health care provider will look at this sample under a microscope to check for bacteria and abnormal cells. A vaginal pH test may also be done.  TREATMENT  Bacterial vaginosis may be treated with antibiotic medicines. These may be given in the form of a pill or a vaginal cream. A second round of antibiotics may be prescribed if the condition comes back after treatment.  HOME CARE INSTRUCTIONS   Only take over-the-counter or prescription medicines as  directed by your health care provider.  If antibiotic medicine was prescribed, take it as directed. Make sure you finish it even if you start to feel better.  Do not have sex until treatment is completed.  Tell all sexual partners that you have a vaginal infection. They should see their health care provider and be treated if they have problems, such as a mild rash or itching.  Practice safe sex by using condoms and only having one sex partner. SEEK MEDICAL CARE IF:   Your symptoms are not improving after 3 days of treatment.  You have increased discharge or pain.  You have a fever. MAKE SURE YOU:   Understand these instructions.  Will watch your condition.  Will get help right away if you are not doing well or get worse. FOR MORE INFORMATION  Centers for Disease Control and Prevention, Division of STD Prevention: [employer]raiserFraud[employer]fi American Sexual Health Association (ASHA): www.ashastd.org  Document Released: 08/18/2005 Document Revised: 06/08/2013 Document Reviewed: 03/30/2013 [facility] Patient Information 2015 [place], [place]. This information is not intended to replace advice given to you by your health care provider. Make sure you discuss any questions you have with your health care provider. Follow up prn

## 2015-04-03 NOTE — Progress Notes (Signed)
Subjective:     [name], female   DOB: 1955-10-22, 59 y.o.   MRN: [id number]  HPI Agam is a 59 year old white female in complaining of pelvic pain and some low back pain, started about 1 week after cleaning.Feels achy now, has taken tylenol, feels like when had BV in past, has vaginal dryness and has used luvena but it is expensive, she says.BP was 136/70 at cardiologists this morning.  Review of Systems Patient denies any daily headaches, hearing loss, fatigue, blurred vision, shortness of breath, chest pain, problems with bowel movements, urination, or intercourse. No joint pain or mood swings.See HPI for positives.  Reviewed past medical,surgical, social and family history. Reviewed medications and allergies.     Objective:   Physical Exam BP 140/78 mmHg  Pulse 72  Ht 5\' 6"  (1.676 m)  Wt 122 lb 8 oz (55.566 kg)  BMI 19.78 kg/m2urine negative, Skin warm and dry.Pelvic: external genitalia is normal in appearance no lesions, vagina: white discharge without odor,urethra has no lesions or masses noted, cervix:smooth, and mildly atrophic, uterus: normal size, shape and contour, non tender, no masses felt, adnexa: no masses or tenderness noted. Bladder is non tender and no masses felt. Wet prep: + for clue cells and +WBCs. No CVAT    Assessment:     Vaginal discharge BV Pelvic pain    Plan:     Rx metrogel 1 applicator in vagina at has x 5 nights, with 1 refill Follow up prn or if pain no better Review handout on BV

## 2015-04-03 NOTE — Patient Instructions (Signed)
Your physician wants you to follow-up in: [place] DR. BRANCH. You will receive a reminder letter in the mail two months in advance. If you don't receive a letter, please call our office to schedule the follow-up appointment.  Your physician recommends that you continue on your current medications as directed. Please refer to the Current Medication list given to you today.  DASH Eating Plan DASH stands for "Dietary Approaches to Stop Hypertension." The DASH eating plan is a healthy eating plan that has been shown to reduce high blood pressure (hypertension). Additional health benefits may include reducing the risk of type 2 diabetes mellitus, heart disease, and stroke. The DASH eating plan may also help with weight loss. WHAT DO I NEED TO KNOW ABOUT THE DASH EATING PLAN? For the DASH eating plan, you will follow these general guidelines:  Choose foods with a percent daily value for sodium of less than 5% (as listed on the food label).  Use salt-free seasonings or herbs instead of table salt or sea salt.  Check with your health care provider or pharmacist before using salt substitutes.  Eat lower-sodium products, often labeled as "lower sodium" or "no salt added."  Eat fresh foods.  Eat more vegetables, fruits, and low-fat dairy products.  Choose whole grains. Look for the word "whole" as the first word in the ingredient list.  Choose fish and skinless chicken or [place] more often than red meat. Limit fish, poultry, and meat to 6 oz (170 g) each day.  Limit sweets, desserts, sugars, and sugary drinks.  Choose heart-healthy fats.  Limit cheese to 1 oz (28 g) per day.  Eat more home-cooked food and less restaurant, buffet, and fast food.  Limit fried foods.  Cook foods using methods other than frying.  Limit canned vegetables. If you do use them, rinse them well to decrease the sodium.  When eating at a restaurant, ask that your food be prepared with less salt, or no salt if  possible. WHAT FOODS CAN I EAT? Seek help from a dietitian for individual calorie needs. Grains Whole grain or whole wheat bread. Brown rice. Whole grain or whole wheat pasta. Quinoa, bulgur, and whole grain cereals. Low-sodium cereals. Corn or whole wheat flour tortillas. Whole grain cornbread. Whole grain crackers. Low-sodium crackers. Vegetables Fresh or frozen vegetables (raw, steamed, roasted, or grilled). Low-sodium or reduced-sodium tomato and vegetable juices. Low-sodium or reduced-sodium tomato sauce and paste. Low-sodium or reduced-sodium canned vegetables.  Fruits All fresh, canned (in natural juice), or frozen fruits. Meat and Other Protein Products Ground beef (85% or leaner), grass-fed beef, or beef trimmed of fat. Skinless chicken or [place]. Ground chicken or [place]. Pork trimmed of fat. All fish and seafood. Eggs. Dried beans, peas, or lentils. Unsalted nuts and seeds. Unsalted canned beans. Dairy Low-fat dairy products, such as skim or 1% milk, 2% or reduced-fat cheeses, low-fat ricotta or cottage cheese, or plain low-fat yogurt. Low-sodium or reduced-sodium cheeses. Fats and Oils Tub margarines without trans fats. Light or reduced-fat mayonnaise and salad dressings (reduced sodium). Avocado. Safflower, olive, or canola oils. Natural peanut or almond butter. Other Unsalted popcorn and pretzels. The items listed above may not be a complete list of recommended foods or beverages. Contact your dietitian for more options. WHAT FOODS ARE NOT RECOMMENDED? Grains White bread. White pasta. White rice. Refined cornbread. Bagels and croissants. Crackers that contain trans fat. Vegetables Creamed or fried vegetables. Vegetables in a cheese sauce. Regular canned vegetables. Regular canned tomato sauce and paste.  Regular tomato and vegetable juices. Fruits Dried fruits. Canned fruit in light or heavy syrup. Fruit juice. Meat and Other Protein Products Fatty cuts of meat. Ribs, chicken  wings, bacon, sausage, bologna, salami, chitterlings, fatback, hot dogs, bratwurst, and packaged luncheon meats. Salted nuts and seeds. Canned beans with salt. Dairy Whole or 2% milk, cream, half-and-half, and cream cheese. Whole-fat or sweetened yogurt. Full-fat cheeses or blue cheese. Nondairy creamers and whipped toppings. Processed cheese, cheese spreads, or cheese curds. Condiments Onion and garlic salt, seasoned salt, table salt, and sea salt. Canned and packaged gravies. Worcestershire sauce. Tartar sauce. Barbecue sauce. Teriyaki sauce. Soy sauce, including reduced sodium. Steak sauce. Fish sauce. Oyster sauce. Cocktail sauce. Horseradish. Ketchup and mustard. Meat flavorings and tenderizers. Bouillon cubes. Hot sauce. Tabasco sauce. Marinades. Taco seasonings. Relishes. Fats and Oils Butter, stick margarine, lard, shortening, ghee, and bacon fat. Coconut, palm kernel, or palm oils. Regular salad dressings. Other Pickles and olives. Salted popcorn and pretzels. The items listed above may not be a complete list of foods and beverages to avoid. Contact your dietitian for more information. WHERE CAN I FIND MORE INFORMATION? National Heart, Lung, and Blood Institute: travelstabloid.com Document Released: 08/07/2011 Document Revised: 01/02/2014 Document Reviewed: 06/22/2013 [facility] Patient Information 2015 [place], [place]. This information is not intended to replace advice given to you by your health care provider. Make sure you discuss any questions you have with your health care provider.   Thanks for choosing [facility]!!!

## 2015-04-03 NOTE — Progress Notes (Signed)
Patient ID: ADALINE TREJOS, female   DOB: 1956-05-09, 59 y.o.   MRN: [id number]     Clinical Summary Ms. Bohnsack is a 59 y.o.female seen today for follow up of the following medical problems.   1. PSVT - history is unclear from available notes. Notes mention PSVT documented on event recorder however do not see monitor report in our system EKGs reviewed, show normal sinus rhythm - minimal caffeine. No EtoH - notes fairly minimal sympsom since last visit  .  Past Medical History  Diagnosis Date  . Chest pain   . Anxiety   . PSVT (paroxysmal supraventricular tachycardia)     Documented by event recorder  . Cervical spine disease     Mild  . Abnormal Pap smear   . Rectocele 03/03/2013  . H/O bilateral breast implants 03/03/2013  . Elevated cholesterol   . Vaginal dryness, menopausal 12/06/2013  . Hot flashes 12/06/2013  . Menopausal symptoms 12/06/2013  . Ganglion cyst of left foot 12/06/2013  . Vaginal discharge 02/24/2014  . Yeast infection 02/24/2014  . Vaginal Pap smear, abnormal   . RUQ pain 03/07/2014  . Back pain 03/07/2014  . Hematuria 03/28/2014  . Hypertension      Allergies  Allergen Reactions  . Bactrim Ds [Sulfamethoxazole-Trimethoprim]     Hives   . Ciprocinonide [Fluocinolone] Hives    " made me feel awful"   . Penicillins   . Sulfa Antibiotics Hives, Diarrhea, Nausea Only and Rash     Current Outpatient Prescriptions  Medication Sig Dispense Refill  . cetirizine (ZYRTEC) 10 MG tablet Take 10 mg by mouth as needed.    . fluconazole (DIFLUCAN) 150 MG tablet Take 1 now and 1 in 3 days 2 tablet 1  . Multiple Vitamin (MULTIVITAMIN) tablet Take 1 tablet by mouth daily.      . Multiple Vitamins-Minerals (HAIR/SKIN/NAILS PO) Take by mouth daily.     No current facility-administered medications for this visit.     Past Surgical History  Procedure Laterality Date  . Breast enhancement surgery    . Toe biopsy       Allergies  Allergen Reactions  . Bactrim Ds  [Sulfamethoxazole-Trimethoprim]     Hives   . Ciprocinonide [Fluocinolone] Hives    " made me feel awful"   . Penicillins   . Sulfa Antibiotics Hives, Diarrhea, Nausea Only and Rash      Family History  Problem Relation Age of Onset  . Dementia Father   . Cancer Mother   . Healthy Daughter   . Healthy Daughter   . Healthy Daughter   . Cancer Maternal Grandmother   . Heart disease Maternal Grandmother   . Diabetes Maternal Grandfather   . Heart attack Paternal Grandmother      Social History Ms. Rossano reports that she has never smoked. She has never used smokeless tobacco. Ms. Mcfate reports that she does not drink alcohol.   Review of Systems CONSTITUTIONAL: No weight loss, fever, chills, weakness or fatigue.  HEENT: Eyes: No visual loss, blurred vision, double vision or yellow sclerae.No hearing loss, sneezing, congestion, runny nose or sore throat.  SKIN: No rash or itching.  CARDIOVASCULAR: per HPI RESPIRATORY: No shortness of breath, cough or sputum.  GASTROINTESTINAL: No anorexia, nausea, vomiting or diarrhea. No abdominal pain or blood.  GENITOURINARY: No burning on urination, no polyuria NEUROLOGICAL: No headache, dizziness, syncope, paralysis, ataxia, numbness or tingling in the extremities. No change in bowel or bladder control.  MUSCULOSKELETAL: No  muscle, back pain, joint pain or stiffness.  LYMPHATICS: No enlarged nodes. No history of splenectomy.  PSYCHIATRIC: No history of depression or anxiety.  ENDOCRINOLOGIC: No reports of sweating, cold or heat intolerance. No polyuria or polydipsia.  Marland Kitchen   Physical Examination Filed Vitals:   04/03/15 0847  BP: 136/76  Pulse: 74   Filed Vitals:   04/03/15 0847  Height: 5\' 6"  (1.676 m)  Weight: 122 lb (55.339 kg)    Gen: resting comfortably, no acute distress HEENT: no scleral icterus, pupils equal round and reactive, no palptable cervical adenopathy,  CV: RRR, no m/r/g, no JVD Resp: Clear to  auscultation bilaterally GI: abdomen is soft, non-tender, non-distended, normal bowel sounds, no hepatosplenomegaly MSK: extremities are warm, no edema.  Skin: warm, no rash Neuro:  no focal deficits Psych: appropriate affect     Assessment and Plan  1. PSVT - fairly minimal symptoms, she remains not interested in medical therapy at this time. Continue to follow   F/u 1 year   Arnoldo Lenis, M.D.

## 2015-04-11 ENCOUNTER — Other Ambulatory Visit: Payer: Self-pay | Admitting: Adult Health

## 2015-04-11 DIAGNOSIS — R102 Pelvic and perineal pain: Secondary | ICD-10-CM

## 2015-04-12 ENCOUNTER — Telehealth: Payer: Self-pay | Admitting: Adult Health

## 2015-04-12 ENCOUNTER — Ambulatory Visit (INDEPENDENT_AMBULATORY_CARE_PROVIDER_SITE_OTHER): Payer: Medicare Other

## 2015-04-12 DIAGNOSIS — R102 Pelvic and perineal pain: Secondary | ICD-10-CM | POA: Diagnosis not present

## 2015-04-12 NOTE — Telephone Encounter (Signed)
Pt aware US normal  

## 2015-04-12 NOTE — Progress Notes (Signed)
PELVIC US TA/TV: normal anteverted uterus,normal ov's bilat,no free fluid,no pain during ultrasound,eec .107mm

## 2015-04-27 ENCOUNTER — Ambulatory Visit (INDEPENDENT_AMBULATORY_CARE_PROVIDER_SITE_OTHER): Payer: Medicare Other | Admitting: Internal Medicine

## 2015-04-27 ENCOUNTER — Encounter (INDEPENDENT_AMBULATORY_CARE_PROVIDER_SITE_OTHER): Payer: Self-pay | Admitting: Internal Medicine

## 2015-04-27 VITALS — BP 110/70 | HR 72 | Temp 97.7°F | Ht 66.0 in | Wt 122.8 lb

## 2015-04-27 DIAGNOSIS — K5909 Other constipation: Secondary | ICD-10-CM | POA: Diagnosis not present

## 2015-04-27 NOTE — Progress Notes (Signed)
Subjective:    [name], female    DOB: [birth year], 59 y.o.   MRN: [id number]  HPI Presents today with c/o bloating. She says she has not eaten right in 2 1/2 yrs. She c/o pain at her umbilicus. She saw Dr. Sherrie Sport and was given MIralax.  She also saw Derrek Monaco NP. She underwent a vaginal exam and was started on Flagyl 500mg  x 7 days.  She has a BM this am. Her stool was normal. She says her stools have been hard. She has not started the MIralax given to her at DR. Hasanaj. She is not bloated this am.  She has gained 6 pounds since her last visit in November. Hx of bloating in the past. See below  CBC    Component Value Date/Time   WBC 5.8 02/23/2015 1333   WBC 4.3 11/01/2013 1428   RBC 4.46 02/23/2015 1333   RBC 4.06 11/01/2013 1428   HGB 12.9 11/01/2013 1428   HCT 43.4 02/23/2015 1333   HCT 38.4 11/01/2013 1428   PLT 225 11/01/2013 1428   MCV 94.6 11/01/2013 1428   MCH 32.1 02/23/2015 1333   MCH 31.8 11/01/2013 1428   MCHC 32.9 02/23/2015 1333   MCHC 33.6 11/01/2013 1428   RDW 13.3 02/23/2015 1333   RDW 12.8 11/01/2013 1428   LYMPHSABS 1.9 04/13/2012 1045   MONOABS 0.5 04/13/2012 1045   EOSABS 0.1 04/13/2012 1045   BASOSABS 0.0 04/13/2012 1045   Hepatic Function Panel     Component Value Date/Time   PROT 7.2 02/23/2015 1333   PROT 6.4 11/01/2013 1428   ALBUMIN 4.1 11/01/2013 1428   AST 22 02/23/2015 1333   ALT 14 02/23/2015 1333   ALKPHOS 83 02/23/2015 1333   BILITOT 0.5 02/23/2015 1333   BILITOT 0.4 11/01/2013 1428   BILIDIR 0.1 04/13/2012 1045   IBILI 0.6 04/13/2012 1045        08/04/2015 NM Emptying Study: IMPRESSION: Normal exam.    04/12/2015 US pelvic:   Normal gyn anatomy, thin endometrium No etiology for pelvic pain is noted   05/28/2009 EGD/Colonoscopy:epigastric pain, early satiety, fullness No evidence of erosive esophagitis. Few gastric polyps at body. Largest one was 6-7 mm and biopsied for histology. Non  erosive antral gastritis. Two diverticula, one at sigmoid, second on at hepatic flexure. A 21mm polyp blated by cold biopsy from ascending colon. Small external hemorrhoids. Biopsy: Stomach: Fundic gland polyp Colon: ascending colon: Tubular adenoma.  Review of Systems Past Medical History  Diagnosis Date  . Chest pain   . Anxiety   . PSVT (paroxysmal supraventricular tachycardia)     Documented by event recorder  . Cervical spine disease     Mild  . Abnormal Pap smear   . Rectocele 03/03/2013  . H/O bilateral breast implants 03/03/2013  . Elevated cholesterol   . Vaginal dryness, menopausal 12/06/2013  . Hot flashes 12/06/2013  . Menopausal symptoms 12/06/2013  . Ganglion cyst of left foot 12/06/2013  . Vaginal discharge 02/24/2014  . Yeast infection 02/24/2014  . Vaginal Pap smear, abnormal   . RUQ pain 03/07/2014  . Back pain 03/07/2014  . Hematuria 03/28/2014  . Hypertension     Past Surgical History  Procedure Laterality Date  . Breast enhancement surgery    . Toe biopsy      Allergies  Allergen Reactions  . Bactrim Ds [Sulfamethoxazole-Trimethoprim]     Hives   . Ciprocinonide [Fluocinolone] Hives    " made me  feel awful"   . Penicillins   . Sulfa Antibiotics Hives, Diarrhea, Nausea Only and Rash    Current Outpatient Prescriptions on File Prior to Visit  Medication Sig Dispense Refill  . cetirizine (ZYRTEC) 10 MG tablet Take 10 mg by mouth as needed.    . metroNIDAZOLE (METROGEL VAGINAL) 0.75 % vaginal gel Place 1 Applicatorful vaginally at bedtime. 70 g 1  . Multiple Vitamin (MULTIVITAMIN) tablet Take 1 tablet by mouth daily.       No current facility-administered medications on file prior to visit.        Objective:   Physical Exam Blood pressure 110/70, pulse 72, temperature 97.7 F (36.5 C), height 5\' 6"  (1.676 m), weight 122 lb 12.8 oz (55.702 kg).  Alert and oriented. Skin warm and dry. Oral mucosa is moist.   . Sclera anicteric, conjunctivae is pink. Thyroid  not enlarged. No cervical lymphadenopathy. Lungs clear. Heart regular rate and rhythm.  Abdomen is soft. Bowel sounds are positive. No hepatomegaly. No abdominal masses felt. No tenderness.  No edema to lower extremities.        Assessment & Plan:  Bloating. Hx of same. Has been taking a excessive amt of vitamins.  Constipation.: Start Miralax 1/2 scoop daily. OV in 1 year.

## 2015-04-27 NOTE — Patient Instructions (Signed)
OV in 1 yr. 

## 2015-05-22 DIAGNOSIS — Z85828 Personal history of other malignant neoplasm of skin: Secondary | ICD-10-CM | POA: Diagnosis not present

## 2015-05-22 DIAGNOSIS — L570 Actinic keratosis: Secondary | ICD-10-CM | POA: Diagnosis not present

## 2015-06-01 DIAGNOSIS — K589 Irritable bowel syndrome without diarrhea: Secondary | ICD-10-CM | POA: Diagnosis not present

## 2015-06-14 DIAGNOSIS — Z23 Encounter for immunization: Secondary | ICD-10-CM | POA: Diagnosis not present

## 2015-07-19 DIAGNOSIS — L570 Actinic keratosis: Secondary | ICD-10-CM | POA: Diagnosis not present

## 2015-08-08 DIAGNOSIS — Z85828 Personal history of other malignant neoplasm of skin: Secondary | ICD-10-CM | POA: Diagnosis not present

## 2015-08-08 DIAGNOSIS — L570 Actinic keratosis: Secondary | ICD-10-CM | POA: Diagnosis not present

## 2015-08-15 ENCOUNTER — Ambulatory Visit: Payer: Medicare Other | Admitting: Adult Health

## 2015-10-02 ENCOUNTER — Ambulatory Visit (INDEPENDENT_AMBULATORY_CARE_PROVIDER_SITE_OTHER): Payer: Medicare Other | Admitting: Cardiology

## 2015-10-02 ENCOUNTER — Encounter: Payer: Self-pay | Admitting: Cardiology

## 2015-10-02 VITALS — BP 142/76 | HR 90 | Ht 66.0 in | Wt 124.0 lb

## 2015-10-02 DIAGNOSIS — R030 Elevated blood-pressure reading, without diagnosis of hypertension: Secondary | ICD-10-CM

## 2015-10-02 DIAGNOSIS — L2089 Other atopic dermatitis: Secondary | ICD-10-CM | POA: Diagnosis not present

## 2015-10-02 DIAGNOSIS — I471 Supraventricular tachycardia: Secondary | ICD-10-CM | POA: Diagnosis not present

## 2015-10-02 DIAGNOSIS — F418 Other specified anxiety disorders: Secondary | ICD-10-CM | POA: Diagnosis not present

## 2015-10-02 DIAGNOSIS — F419 Anxiety disorder, unspecified: Secondary | ICD-10-CM | POA: Diagnosis not present

## 2015-10-02 DIAGNOSIS — I10 Essential (primary) hypertension: Secondary | ICD-10-CM | POA: Diagnosis not present

## 2015-10-02 DIAGNOSIS — IMO0001 Reserved for inherently not codable concepts without codable children: Secondary | ICD-10-CM

## 2015-10-02 NOTE — Patient Instructions (Addendum)
   Referral to [facility] for anxiety - [place]. Continue all current medications. Your physician has requested that you regularly monitor and record your blood pressure readings at home. Please use the same machine at the same time of day to check your readings and record them to bring to your follow-up visit.  Keep log x 1 week and return to office for MD review. Follow up in  3 months.

## 2015-10-02 NOTE — Progress Notes (Signed)
[name], female   DOB: [birth year], 60 y.o.   MRN: [id number]     Clinical Summary April Knox is a 60 y.o.female seen today for follow up of the following medical problems.   1. PSVT - history is unclear from available notes. Notes mention PSVT documented on event recorder however do not see monitor report in our system EKGs reviewed, show normal sinus rhythm   - episode of palpitations last night. Woke up from sleep, felt anxious about upcoming work related test. Felt heart racing. No SOB, no chest pain. Lasted just a few seconds and then resolved. Similar to prior episodes. First episode in sometime. - has been drinking latte x 1 daily, no sodas, occasional tea, no energy drinks, no EtoH.     Past Medical History  Diagnosis Date  . Chest pain   . Anxiety   . PSVT (paroxysmal supraventricular tachycardia)     Documented by event recorder  . Cervical spine disease     Mild  . Abnormal Pap smear   . Rectocele 03/03/2013  . H/O bilateral breast implants 03/03/2013  . Elevated cholesterol   . Vaginal dryness, menopausal 12/06/2013  . Hot flashes 12/06/2013  . Menopausal symptoms 12/06/2013  . Ganglion cyst of left foot 12/06/2013  . Vaginal discharge 02/24/2014  . Yeast infection 02/24/2014  . Vaginal Pap smear, abnormal   . RUQ pain 03/07/2014  . Back pain 03/07/2014  . Hematuria 03/28/2014  . Hypertension      Allergies  Allergen Reactions  . Bactrim Ds [Sulfamethoxazole-Trimethoprim]     Hives   . Ciprocinonide [Fluocinolone] Hives    " made me feel awful"   . Penicillins   . Sulfa Antibiotics Hives, Diarrhea, Nausea Only and Rash     Current Outpatient Prescriptions  Medication Sig Dispense Refill  . cetirizine (ZYRTEC) 10 MG tablet Take 10 mg by mouth as needed.    . metroNIDAZOLE (METROGEL VAGINAL) 0.75 % vaginal gel Place 1 Applicatorful vaginally at bedtime. 70 g 1  . Multiple Vitamin (MULTIVITAMIN) tablet Take 1 tablet by mouth daily.      .  polyethylene glycol (MIRALAX / GLYCOLAX) packet Take 17 g by mouth daily.    . ranitidine (ZANTAC) 300 MG tablet Take 300 mg by mouth at bedtime.     No current facility-administered medications for this visit.     Past Surgical History  Procedure Laterality Date  . Breast enhancement surgery    . Toe biopsy       Allergies  Allergen Reactions  . Bactrim Ds [Sulfamethoxazole-Trimethoprim]     Hives   . Ciprocinonide [Fluocinolone] Hives    " made me feel awful"   . Penicillins   . Sulfa Antibiotics Hives, Diarrhea, Nausea Only and Rash      Family History  Problem Relation Age of Onset  . Dementia Father   . Cancer Mother   . Healthy Daughter   . Healthy Daughter   . Healthy Daughter   . Cancer Maternal Grandmother   . Heart disease Maternal Grandmother   . Diabetes Maternal Grandfather   . Heart attack Paternal Grandmother      Social History April Knox reports that she has never smoked. She has never used smokeless tobacco. April Knox reports that she does not drink alcohol.   Review of Systems CONSTITUTIONAL: No weight loss, fever, chills, weakness or fatigue.  HEENT: Eyes: No visual loss, blurred vision, double vision or yellow sclerae.No hearing loss, sneezing,  congestion, runny nose or sore throat.  SKIN: No rash or itching.  CARDIOVASCULAR: per hpi RESPIRATORY: No shortness of breath, cough or sputum.  GASTROINTESTINAL: No anorexia, nausea, vomiting or diarrhea. No abdominal pain or blood.  GENITOURINARY: No burning on urination, no polyuria NEUROLOGICAL: +anxiety MUSCULOSKELETAL: No muscle, back pain, joint pain or stiffness.  LYMPHATICS: No enlarged nodes. No history of splenectomy.  PSYCHIATRIC: No history of depression or anxiety.  ENDOCRINOLOGIC: No reports of sweating, cold or heat intolerance. No polyuria or polydipsia.  Marland Kitchen   Physical Examination Filed Vitals:   10/02/15 1019 10/02/15 1126  BP: 154/83 142/76  Pulse: 90    Filed  Vitals:   10/02/15 1019  Height: 5\' 6"  (1.676 m)  Weight: 124 lb (56.246 kg)    Gen: resting comfortably, no acute distress HEENT: no scleral icterus, pupils equal round and reactive, no palptable cervical adenopathy,  CV: RRR, no m/r/g, no jvd Resp: Clear to auscultation bilaterally GI: abdomen is soft, non-tender, non-distended, normal bowel sounds, no hepatosplenomegaly MSK: extremities are warm, no edema.  Skin: warm, no rash Neuro:  no focal deficits Psych: appropriate affect     Assessment and Plan   1. PSVT - long history of palpitations, episode last night similar to previous episodes. Overall infrequent. She has been resistant to medical therapy, we will continue to monitor  2. Elevated blood pressure - bp elevated in clinic initially. She is very anxious today, repeat bp at end of visit 140/70. Continue to monitor, no indication for antihypertensive at this time  3. Anxiety - severe anxiety, reports some symptoms of depression as well. Will refer to behavioral health.       Arnoldo Lenis, M.D.

## 2015-10-05 ENCOUNTER — Telehealth: Payer: Self-pay | Admitting: *Deleted

## 2015-10-05 NOTE — Telephone Encounter (Signed)
Pt waited to talk about taking zoloft, Dr Zada Girt her PCP wants her to try 25 mg zoloft for anxiety and I told her to try it, and follow up with him

## 2015-10-08 ENCOUNTER — Telehealth: Payer: Self-pay | Admitting: Cardiology

## 2015-10-08 NOTE — Telephone Encounter (Signed)
From heart standpoint this is ok  Zandra Abts MD

## 2015-10-08 NOTE — Telephone Encounter (Signed)
April Knox went to see PCP on 10/05/15 and was given Sertraline  25 mg for anxiety.

## 2015-10-08 NOTE — Telephone Encounter (Signed)
Noted. Medication profile updated with this addition. Will forward to MD FYI.

## 2015-10-16 ENCOUNTER — Telehealth: Payer: Self-pay | Admitting: Adult Health

## 2015-10-17 NOTE — Telephone Encounter (Signed)
Jennifer is aware

## 2015-10-18 ENCOUNTER — Ambulatory Visit (INDEPENDENT_AMBULATORY_CARE_PROVIDER_SITE_OTHER): Payer: Medicare Other | Admitting: Adult Health

## 2015-10-18 ENCOUNTER — Other Ambulatory Visit: Payer: Medicare Other

## 2015-10-18 ENCOUNTER — Encounter: Payer: Self-pay | Admitting: Adult Health

## 2015-10-18 ENCOUNTER — Telehealth: Payer: Self-pay | Admitting: Adult Health

## 2015-10-18 VITALS — BP 160/78 | HR 72 | Temp 98.2°F | Ht 66.0 in | Wt 119.5 lb

## 2015-10-18 DIAGNOSIS — R030 Elevated blood-pressure reading, without diagnosis of hypertension: Secondary | ICD-10-CM

## 2015-10-18 DIAGNOSIS — N951 Menopausal and female climacteric states: Secondary | ICD-10-CM | POA: Diagnosis not present

## 2015-10-18 DIAGNOSIS — Z139 Encounter for screening, unspecified: Secondary | ICD-10-CM

## 2015-10-18 DIAGNOSIS — Z1329 Encounter for screening for other suspected endocrine disorder: Secondary | ICD-10-CM

## 2015-10-18 DIAGNOSIS — M546 Pain in thoracic spine: Secondary | ICD-10-CM

## 2015-10-18 DIAGNOSIS — R232 Flushing: Secondary | ICD-10-CM

## 2015-10-18 DIAGNOSIS — Z13220 Encounter for screening for lipoid disorders: Secondary | ICD-10-CM

## 2015-10-18 DIAGNOSIS — IMO0001 Reserved for inherently not codable concepts without codable children: Secondary | ICD-10-CM

## 2015-10-18 LAB — POCT URINALYSIS DIPSTICK
Glucose, UA: NEGATIVE
LEUKOCYTES UA: NEGATIVE
NITRITE UA: NEGATIVE
PROTEIN UA: NEGATIVE
RBC UA: NEGATIVE

## 2015-10-18 NOTE — Patient Instructions (Signed)
Take zoloft Follow up in 5 months for pap and physical

## 2015-10-18 NOTE — Progress Notes (Signed)
Subjective:     [name], female   DOB: 1956-06-09, 60 y.o.   MRN: [id number]  HPI April Knox is a 60 year old white female, widowed in complaining of hot flashes and back pain, feels bloated at times and stomach will hurt.Has cough at times. 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  PCP is Dr Zada Girt in [place].  Review of Systems Patient denies any headaches, hearing loss, fatigue, blurred vision, shortness of breath, chest pain,  problems with bowel movements, urination, or intercourse. No joint pain or mood swings.See HPI for positives. Reviewed past medical,surgical, social and family history. Reviewed medications and allergies.     Objective:   Physical Exam BP 160/78 mmHg  Pulse 72  Temp(Src) 98.2 F (36.8 C)  Ht 5\' 6"  (1.676 m)  Wt 119 lb 8 oz (54.205 kg)  BMI 19.30 kg/m2 urine negative, Skin warm and dry. Neck: mid line trachea, normal thyroid, good ROM, no lymphadenopathy noted. Lungs: clear to ausculation bilaterally. Cardiovascular: regular rate and rhythm.No facial tenderness, no CVAT, no point tenderness. .Abdomen soft, non tender, no HSM noted. Discussed taking BP at home in am and pm randomly and needs to try the zoloft that Dr Zada Girt prescribed for anxiety, it may help with BP and hot flashes too.Follow up with Dr Laural Golden for bloating in epi gastric area.   Face time 15 minutes with 50 % counseling,see above. Assessment:     Elevated BP Hot flashes Back pain    Plan:    Check CBC and CMP Take zoloft Follow up with GI  Follow up with Dr Zada Girt  Follow up in 5 months for pap and physical

## 2015-10-18 NOTE — Telephone Encounter (Signed)
Pt called BP 133/67

## 2015-10-19 ENCOUNTER — Telehealth: Payer: Self-pay | Admitting: Adult Health

## 2015-10-19 LAB — COMPREHENSIVE METABOLIC PANEL
ALK PHOS: 82 IU/L (ref 39–117)
ALT: 14 IU/L (ref 0–32)
AST: 19 IU/L (ref 0–40)
Albumin/Globulin Ratio: 1.6 (ref 1.1–2.5)
Albumin: 4.4 g/dL (ref 3.6–4.8)
BUN/Creatinine Ratio: 18 (ref 11–26)
BUN: 10 mg/dL (ref 8–27)
Bilirubin Total: 0.6 mg/dL (ref 0.0–1.2)
CALCIUM: 9.7 mg/dL (ref 8.7–10.3)
CO2: 26 mmol/L (ref 18–29)
Chloride: 99 mmol/L (ref 96–106)
Creatinine, Ser: 0.57 mg/dL (ref 0.57–1.00)
GFR calc Af Amer: 117 mL/min/{1.73_m2} (ref >59)
GFR calc non Af Amer: 101 mL/min/{1.73_m2} (ref >59)
GLUCOSE: 106 mg/dL — AB (ref 65–99)
Globulin, Total: 2.8 g/dL (ref 1.5–4.5)
Potassium: 4.3 mmol/L (ref 3.5–5.2)
Sodium: 139 mmol/L (ref 134–144)
Total Protein: 7.2 g/dL (ref 6.0–8.5)

## 2015-10-19 LAB — CBC
Hematocrit: 41.8 % (ref 34.0–46.6)
Hemoglobin: 14.1 g/dL (ref 11.1–15.9)
MCH: 31.5 pg (ref 26.6–33.0)
MCHC: 33.7 g/dL (ref 31.5–35.7)
MCV: 93 fL (ref 79–97)
Platelets: 298 x10E3/uL (ref 150–379)
RBC: 4.48 x10E6/uL (ref 3.77–5.28)
RDW: 12.1 % — ABNORMAL LOW (ref 12.3–15.4)
WBC: 7.6 x10E3/uL (ref 3.4–10.8)

## 2015-10-19 NOTE — Telephone Encounter (Signed)
Left message labs looked good, glucose 106, which is up a little if fasting

## 2015-10-22 ENCOUNTER — Telehealth: Payer: Self-pay | Admitting: Adult Health

## 2015-10-23 NOTE — Telephone Encounter (Signed)
Left message I returned her call  

## 2015-10-26 ENCOUNTER — Telehealth: Payer: Self-pay | Admitting: *Deleted

## 2015-10-26 MED ORDER — ALPRAZOLAM 0.5 MG PO TABS: 0.5000 mg | ORAL_TABLET | Freq: Every evening | ORAL | Status: DC | PRN

## 2015-10-26 NOTE — Telephone Encounter (Signed)
Feeling anxious, wants something, will Rx xanax has used in past

## 2015-10-31 DIAGNOSIS — L309 Dermatitis, unspecified: Secondary | ICD-10-CM | POA: Diagnosis not present

## 2015-10-31 DIAGNOSIS — Z85828 Personal history of other malignant neoplasm of skin: Secondary | ICD-10-CM | POA: Diagnosis not present

## 2015-10-31 DIAGNOSIS — L570 Actinic keratosis: Secondary | ICD-10-CM | POA: Diagnosis not present

## 2015-11-02 ENCOUNTER — Ambulatory Visit: Payer: Medicare Other | Admitting: Cardiology

## 2015-11-06 ENCOUNTER — Telehealth: Payer: Self-pay | Admitting: *Deleted

## 2015-11-06 NOTE — Telephone Encounter (Signed)
-----   Message from Chanda Busing sent at 10/30/2015  8:59 AM EST ----- Behavioral Health will not contact us. They only contact the patient. The only thing I can do is call and ask them to check on this. We can not see the appointments when they are made.   ----- Message -----    From: Massie Maroon, CMA    Sent: 10/29/2015   1:41 PM      To: Chanda Busing  This pt was referred for behavorial health back in January and there hasn't been an appt made for her yet. Could you check on this?  St[place]

## 2015-11-06 NOTE — Telephone Encounter (Signed)
Pt was told on 2/28 to f/u with Behavioral health, also we contacted them and were told that the pt needed to call appt had not been scheduled yet. Pt was informed and given contact information.

## 2015-11-08 DIAGNOSIS — Z9882 Breast implant status: Secondary | ICD-10-CM | POA: Diagnosis not present

## 2015-11-08 DIAGNOSIS — Z1231 Encounter for screening mammogram for malignant neoplasm of breast: Secondary | ICD-10-CM | POA: Diagnosis not present

## 2015-11-14 ENCOUNTER — Telehealth (HOSPITAL_COMMUNITY): Payer: Self-pay | Admitting: *Deleted

## 2015-11-29 DIAGNOSIS — H35033 Hypertensive retinopathy, bilateral: Secondary | ICD-10-CM | POA: Diagnosis not present

## 2015-12-06 DIAGNOSIS — G43009 Migraine without aura, not intractable, without status migrainosus: Secondary | ICD-10-CM | POA: Diagnosis not present

## 2015-12-06 DIAGNOSIS — I10 Essential (primary) hypertension: Secondary | ICD-10-CM | POA: Diagnosis not present

## 2015-12-12 ENCOUNTER — Ambulatory Visit (HOSPITAL_COMMUNITY): Payer: Self-pay | Admitting: Psychiatry

## 2015-12-31 ENCOUNTER — Ambulatory Visit: Payer: Medicare Other | Admitting: Cardiology

## 2016-01-14 DIAGNOSIS — H00012 Hordeolum externum right lower eyelid: Secondary | ICD-10-CM | POA: Diagnosis not present

## 2016-01-16 ENCOUNTER — Encounter (INDEPENDENT_AMBULATORY_CARE_PROVIDER_SITE_OTHER): Payer: Self-pay | Admitting: Internal Medicine

## 2016-01-21 DIAGNOSIS — B079 Viral wart, unspecified: Secondary | ICD-10-CM | POA: Diagnosis not present

## 2016-01-21 DIAGNOSIS — Z85828 Personal history of other malignant neoplasm of skin: Secondary | ICD-10-CM | POA: Diagnosis not present

## 2016-02-06 DIAGNOSIS — H00012 Hordeolum externum right lower eyelid: Secondary | ICD-10-CM | POA: Diagnosis not present

## 2016-02-14 DIAGNOSIS — H0012 Chalazion right lower eyelid: Secondary | ICD-10-CM | POA: Diagnosis not present

## 2016-02-25 DIAGNOSIS — Z85828 Personal history of other malignant neoplasm of skin: Secondary | ICD-10-CM | POA: Diagnosis not present

## 2016-02-25 DIAGNOSIS — L570 Actinic keratosis: Secondary | ICD-10-CM | POA: Diagnosis not present

## 2016-02-25 DIAGNOSIS — D1800 Hemangioma unspecified site: Secondary | ICD-10-CM | POA: Diagnosis not present

## 2016-03-03 ENCOUNTER — Telehealth: Payer: Self-pay | Admitting: *Deleted

## 2016-03-03 MED ORDER — FLUCONAZOLE 150 MG PO TABS: ORAL_TABLET | ORAL | Status: DC

## 2016-03-03 NOTE — Telephone Encounter (Signed)
Will rx diflucan  

## 2016-03-03 NOTE — Telephone Encounter (Signed)
Spoke with pt. Pt has been taking Doxycycline and feels like she may be getting a yeast infection. Can you order med? Thanks!! [place]

## 2016-03-13 DIAGNOSIS — H0012 Chalazion right lower eyelid: Secondary | ICD-10-CM | POA: Diagnosis not present

## 2016-03-25 ENCOUNTER — Other Ambulatory Visit: Payer: Medicare Other | Admitting: Adult Health

## 2016-03-28 ENCOUNTER — Other Ambulatory Visit (HOSPITAL_COMMUNITY)
Admission: RE | Admit: 2016-03-28 | Discharge: 2016-03-28 | Disposition: A | Discharge status: Home / Self Care | Payer: Medicare Other | Source: Ambulatory Visit | Attending: Adult Health | Admitting: Adult Health

## 2016-03-28 ENCOUNTER — Ambulatory Visit (INDEPENDENT_AMBULATORY_CARE_PROVIDER_SITE_OTHER): Payer: Medicare Other | Admitting: Adult Health

## 2016-03-28 ENCOUNTER — Encounter: Payer: Self-pay | Admitting: Adult Health

## 2016-03-28 VITALS — BP 158/62 | HR 62 | Ht 65.5 in | Wt 126.0 lb

## 2016-03-28 DIAGNOSIS — IMO0001 Reserved for inherently not codable concepts without codable children: Secondary | ICD-10-CM

## 2016-03-28 DIAGNOSIS — Z1151 Encounter for screening for human papillomavirus (HPV): Secondary | ICD-10-CM | POA: Diagnosis not present

## 2016-03-28 DIAGNOSIS — Z124 Encounter for screening for malignant neoplasm of cervix: Secondary | ICD-10-CM

## 2016-03-28 DIAGNOSIS — Z01419 Encounter for gynecological examination (general) (routine) without abnormal findings: Secondary | ICD-10-CM

## 2016-03-28 DIAGNOSIS — Z113 Encounter for screening for infections with a predominantly sexual mode of transmission: Secondary | ICD-10-CM | POA: Insufficient documentation

## 2016-03-28 DIAGNOSIS — F419 Anxiety disorder, unspecified: Secondary | ICD-10-CM | POA: Diagnosis not present

## 2016-03-28 DIAGNOSIS — R030 Elevated blood-pressure reading, without diagnosis of hypertension: Secondary | ICD-10-CM

## 2016-03-28 DIAGNOSIS — Z1389 Encounter for screening for other disorder: Secondary | ICD-10-CM

## 2016-03-28 DIAGNOSIS — Z1211 Encounter for screening for malignant neoplasm of colon: Secondary | ICD-10-CM | POA: Diagnosis not present

## 2016-03-28 LAB — HEMOCCULT GUIAC POC 1CARD (OFFICE): FECAL OCCULT BLD: NEGATIVE

## 2016-03-28 LAB — POCT URINALYSIS DIPSTICK
Glucose, UA: NEGATIVE
Leukocytes, UA: NEGATIVE
Nitrite, UA: NEGATIVE
Protein, UA: NEGATIVE
RBC UA: NEGATIVE

## 2016-03-28 MED ORDER — ALPRAZOLAM 0.5 MG PO TABS
0.5000 mg | ORAL_TABLET | Freq: Every evening | ORAL | 0 refills | Status: DC | PRN
Start: 2016-03-28 — End: 2016-05-08

## 2016-03-28 NOTE — Patient Instructions (Addendum)
Physical in  1year,pap in 3 if normal Mammogram yearly Colonoscopy per GI Labs with PCP
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
## 2016-03-31 LAB — CYTOLOGY - PAP

## 2016-04-01 DIAGNOSIS — M546 Pain in thoracic spine: Secondary | ICD-10-CM | POA: Diagnosis not present

## 2016-04-01 DIAGNOSIS — Z79899 Other long term (current) drug therapy: Secondary | ICD-10-CM | POA: Diagnosis not present

## 2016-04-01 DIAGNOSIS — M542 Cervicalgia: Secondary | ICD-10-CM | POA: Diagnosis not present

## 2016-04-01 DIAGNOSIS — S161XXA Strain of muscle, fascia and tendon at neck level, initial encounter: Secondary | ICD-10-CM | POA: Diagnosis not present

## 2016-04-01 DIAGNOSIS — R51 Headache: Secondary | ICD-10-CM | POA: Diagnosis not present

## 2016-04-01 DIAGNOSIS — S29019A Strain of muscle and tendon of unspecified wall of thorax, initial encounter: Secondary | ICD-10-CM | POA: Diagnosis not present

## 2016-04-14 ENCOUNTER — Telehealth: Payer: Self-pay | Admitting: Adult Health

## 2016-04-14 NOTE — Telephone Encounter (Signed)
Left message letting pt know all was normal on her pap. April Knox

## 2016-04-15 DIAGNOSIS — I10 Essential (primary) hypertension: Secondary | ICD-10-CM | POA: Diagnosis not present

## 2016-04-15 DIAGNOSIS — G43009 Migraine without aura, not intractable, without status migrainosus: Secondary | ICD-10-CM | POA: Diagnosis not present

## 2016-04-15 DIAGNOSIS — Z0000 Encounter for general adult medical examination without abnormal findings: Secondary | ICD-10-CM | POA: Diagnosis not present

## 2016-04-15 DIAGNOSIS — Z1389 Encounter for screening for other disorder: Secondary | ICD-10-CM | POA: Diagnosis not present

## 2016-04-16 ENCOUNTER — Encounter (INDEPENDENT_AMBULATORY_CARE_PROVIDER_SITE_OTHER): Payer: Self-pay | Admitting: Internal Medicine

## 2016-04-16 ENCOUNTER — Ambulatory Visit (INDEPENDENT_AMBULATORY_CARE_PROVIDER_SITE_OTHER): Payer: Medicare Other | Admitting: Internal Medicine

## 2016-04-16 ENCOUNTER — Other Ambulatory Visit (INDEPENDENT_AMBULATORY_CARE_PROVIDER_SITE_OTHER): Payer: Self-pay | Admitting: Internal Medicine

## 2016-04-16 ENCOUNTER — Encounter (INDEPENDENT_AMBULATORY_CARE_PROVIDER_SITE_OTHER): Payer: Self-pay | Admitting: *Deleted

## 2016-04-16 ENCOUNTER — Telehealth (INDEPENDENT_AMBULATORY_CARE_PROVIDER_SITE_OTHER): Payer: Self-pay | Admitting: *Deleted

## 2016-04-16 VITALS — BP 140/58 | HR 64 | Temp 98.1°F | Ht 66.0 in | Wt 126.4 lb

## 2016-04-16 DIAGNOSIS — D369 Benign neoplasm, unspecified site: Secondary | ICD-10-CM

## 2016-04-16 DIAGNOSIS — Z86010 Personal history of colonic polyps: Secondary | ICD-10-CM

## 2016-04-16 MED ORDER — PEG 3350-KCL-NA BICARB-NACL 420 G PO SOLR: 4000.0000 mL | Freq: Once | ORAL | 0 refills | Status: AC

## 2016-04-16 NOTE — Telephone Encounter (Signed)
Patient needs trilyte 

## 2016-04-16 NOTE — Patient Instructions (Signed)
Colonoscopy.  The risks and benefits such as perforation, bleeding, and infection were reviewed with the patient and is agreeable. 

## 2016-04-16 NOTE — Progress Notes (Addendum)
   Subjective:    [name], female    DOB: [birth year], 60 y.o.   MRN: [id number]  HPI Here today for f/u. She states she is due for a colonoscopy. Her last colonoscopy was in 2010 with a tubular adenoma.   Sometimes she says she does have some bloating.  Her appetite is good. She has gained 4 pounds since her lat visit in August of last year. She has a BM about every day or every other day. No change in her BMs.  She bloats when she eats watermelons and cantaloupes and when she overeats.  She denies acid reflux.      Review of Systems Past Medical History:  Diagnosis Date  . Abnormal Pap smear   . Anxiety   . Back pain 03/07/2014  . Cervical spine disease    Mild  . Chest pain   . Elevated cholesterol   . Ganglion cyst of left foot 12/06/2013  . H/O bilateral breast implants 03/03/2013  . Hematuria 03/28/2014  . Hot flashes 12/06/2013  . Hypertension   . Menopausal symptoms 12/06/2013  . PSVT (paroxysmal supraventricular tachycardia) ([place])    Documented by event recorder  . Rectocele 03/03/2013  . RUQ pain 03/07/2014  . Vaginal discharge 02/24/2014  . Vaginal dryness, menopausal 12/06/2013  . Vaginal Pap smear, abnormal   . Yeast infection 02/24/2014    Past Surgical History:  Procedure Laterality Date  . BREAST ENHANCEMENT SURGERY    . toe biopsy      Allergies  Allergen Reactions  . Bactrim Ds [Sulfamethoxazole-Trimethoprim]     Hives   . Ciprocinonide [Fluocinolone] Hives    " made me feel awful"   . Penicillins   . Sulfa Antibiotics Hives, Diarrhea, Nausea Only and Rash    Current Outpatient Prescriptions on File Prior to Visit  Medication Sig Dispense Refill  . cetirizine (ZYRTEC) 10 MG tablet Take 10 mg by mouth as needed.    . Multiple Vitamin (MULTIVITAMIN) tablet Take 1 tablet by mouth daily.      . sertraline (ZOLOFT) 25 MG tablet Take 25 mg by mouth daily. Reported on 10/18/2015    . ALPRAZolam (XANAX) 0.5 MG tablet Take 1 tablet (0.5 mg total)  by mouth at bedtime as needed for anxiety. (Patient not taking: Reported on 04/16/2016) 15 tablet 0   No current facility-administered medications on file prior to visit.        Objective:   Physical Exam Blood pressure (!) 140/58, pulse 64, temperature 98.1 F (36.7 C), height 5\' 6"  (1.676 m), weight 126 lb 6.4 oz (57.3 kg). Alert and oriented. Skin warm and dry. Oral mucosa is moist.   . Sclera anicteric, conjunctivae is pink. Thyroid not enlarged. No cervical lymphadenopathy. Lungs clear. Heart regular rate and rhythm.  Abdomen is soft. Bowel sounds are positive. No hepatomegaly. No abdominal masses felt. No tenderness.  No edema to lower extremities.          Assessment & Plan:  Tubular adenoma. Needs surveillance.  Colonoscopy. The risks and benefits such as perforation, bleeding, and infection were reviewed with the patient and is agreeable.

## 2016-04-17 ENCOUNTER — Ambulatory Visit (INDEPENDENT_AMBULATORY_CARE_PROVIDER_SITE_OTHER): Payer: Self-pay | Admitting: Internal Medicine

## 2016-04-30 ENCOUNTER — Telehealth: Payer: Self-pay | Admitting: *Deleted

## 2016-04-30 NOTE — Telephone Encounter (Signed)
Pt states she feels bloated a lot lower stomach and into back. Pt states her rectal area feels irritated "feels funny." Pt states her PCP told her to take Gasx. Pt advised would need to follow up with her PCP for treatment, sounds more Gastro vs Gynecological. Pt verbalized understanding.

## 2016-05-01 DIAGNOSIS — R1013 Epigastric pain: Secondary | ICD-10-CM | POA: Diagnosis not present

## 2016-05-01 DIAGNOSIS — K219 Gastro-esophageal reflux disease without esophagitis: Secondary | ICD-10-CM | POA: Diagnosis not present

## 2016-05-01 DIAGNOSIS — R079 Chest pain, unspecified: Secondary | ICD-10-CM | POA: Diagnosis not present

## 2016-05-08 ENCOUNTER — Telehealth: Payer: Self-pay | Admitting: Adult Health

## 2016-05-08 MED ORDER — ALPRAZOLAM 0.5 MG PO TABS: 0.5000 mg | ORAL_TABLET | Freq: Every evening | ORAL | 0 refills | Status: DC | PRN

## 2016-05-08 NOTE — Telephone Encounter (Signed)
Feels anxious wants refill on xanax

## 2016-05-26 DIAGNOSIS — H1089 Other conjunctivitis: Secondary | ICD-10-CM | POA: Diagnosis not present

## 2016-05-26 DIAGNOSIS — I10 Essential (primary) hypertension: Secondary | ICD-10-CM | POA: Diagnosis not present

## 2016-05-26 DIAGNOSIS — G43009 Migraine without aura, not intractable, without status migrainosus: Secondary | ICD-10-CM | POA: Diagnosis not present

## 2016-05-27 DIAGNOSIS — H00015 Hordeolum externum left lower eyelid: Secondary | ICD-10-CM | POA: Diagnosis not present

## 2016-06-09 DIAGNOSIS — Z23 Encounter for immunization: Secondary | ICD-10-CM | POA: Diagnosis not present

## 2016-06-12 DIAGNOSIS — H0015 Chalazion left lower eyelid: Secondary | ICD-10-CM | POA: Diagnosis not present

## 2016-06-18 ENCOUNTER — Telehealth (INDEPENDENT_AMBULATORY_CARE_PROVIDER_SITE_OTHER): Payer: Self-pay | Admitting: *Deleted

## 2016-06-18 MED ORDER — SOD PHOS MONO-SOD PHOS DIBASIC 1.102-0.398 G PO TABS: 32.0000 | ORAL_TABLET | Freq: Once | ORAL | 0 refills | Status: AC

## 2016-06-18 NOTE — Telephone Encounter (Signed)
Patient needs osmo pill 

## 2016-06-24 ENCOUNTER — Other Ambulatory Visit (INDEPENDENT_AMBULATORY_CARE_PROVIDER_SITE_OTHER): Payer: Self-pay | Admitting: *Deleted

## 2016-06-24 DIAGNOSIS — Z86010 Personal history of colonic polyps: Secondary | ICD-10-CM

## 2016-06-25 ENCOUNTER — Encounter (HOSPITAL_COMMUNITY): Payer: Self-pay

## 2016-06-25 ENCOUNTER — Encounter (HOSPITAL_COMMUNITY)
Admission: RE | Disposition: A | Discharge status: Home / Self Care | Payer: Self-pay | Source: Ambulatory Visit | Attending: Internal Medicine

## 2016-06-25 ENCOUNTER — Ambulatory Visit (HOSPITAL_COMMUNITY)
Admission: RE | Admit: 2016-06-25 | Discharge: 2016-06-25 | Disposition: A | Discharge status: Home / Self Care | Payer: Medicare Other | Source: Ambulatory Visit | Attending: Internal Medicine | Admitting: Internal Medicine

## 2016-06-25 DIAGNOSIS — I10 Essential (primary) hypertension: Secondary | ICD-10-CM | POA: Diagnosis not present

## 2016-06-25 DIAGNOSIS — E7800 Pure hypercholesterolemia, unspecified: Secondary | ICD-10-CM | POA: Insufficient documentation

## 2016-06-25 DIAGNOSIS — K5730 Diverticulosis of large intestine without perforation or abscess without bleeding: Secondary | ICD-10-CM | POA: Insufficient documentation

## 2016-06-25 DIAGNOSIS — D123 Benign neoplasm of transverse colon: Secondary | ICD-10-CM | POA: Diagnosis not present

## 2016-06-25 DIAGNOSIS — Z860100 Personal history of colon polyps, unspecified: Secondary | ICD-10-CM | POA: Insufficient documentation

## 2016-06-25 DIAGNOSIS — K644 Residual hemorrhoidal skin tags: Secondary | ICD-10-CM | POA: Insufficient documentation

## 2016-06-25 DIAGNOSIS — Z1211 Encounter for screening for malignant neoplasm of colon: Secondary | ICD-10-CM | POA: Diagnosis not present

## 2016-06-25 DIAGNOSIS — Z880 Allergy status to penicillin: Secondary | ICD-10-CM | POA: Insufficient documentation

## 2016-06-25 DIAGNOSIS — Z09 Encounter for follow-up examination after completed treatment for conditions other than malignant neoplasm: Secondary | ICD-10-CM | POA: Diagnosis not present

## 2016-06-25 HISTORY — PX: COLONOSCOPY: SHX5424

## 2016-06-25 HISTORY — PX: POLYPECTOMY: SHX5525

## 2016-06-25 SURGERY — COLONOSCOPY
Anesthesia: Moderate Sedation

## 2016-06-25 MED ORDER — SODIUM CHLORIDE 0.9 % IV SOLN
INTRAVENOUS | Status: DC
  Administered 2016-06-25: 12:00:00 via INTRAVENOUS

## 2016-06-25 MED ORDER — MIDAZOLAM HCL 5 MG/5ML IJ SOLN
INTRAMUSCULAR | Status: DC | PRN
  Administered 2016-06-25 (×2): 2 mg via INTRAVENOUS
  Administered 2016-06-25 (×2): 1 mg via INTRAVENOUS

## 2016-06-25 MED ORDER — SIMETHICONE 40 MG/0.6ML PO SUSP
ORAL | Status: DC | PRN
  Administered 2016-06-25: 13:00:00

## 2016-06-25 MED ORDER — MEPERIDINE HCL 50 MG/ML IJ SOLN
INTRAMUSCULAR | Status: DC | PRN
  Administered 2016-06-25 (×2): 25 mg via INTRAVENOUS

## 2016-06-25 MED ORDER — MEPERIDINE HCL 50 MG/ML IJ SOLN
INTRAMUSCULAR | Status: AC
  Filled 2016-06-25: qty 1

## 2016-06-25 MED ORDER — MIDAZOLAM HCL 5 MG/5ML IJ SOLN
INTRAMUSCULAR | Status: AC
  Filled 2016-06-25: qty 10

## 2016-06-25 NOTE — H&P (Signed)
April Knox is an 60 y.o. female.   Chief Complaint: Patient's here for colonoscopy. HPI: Patient is 60 year-old Caucasian female who has history of colonic adenoma and is here for surveillance colonoscopy. Her last colonoscopy was 7 years ago. She denies abdominal pain change in bowel habits or rectal bleeding. Family history is negative for CRC.  Past Medical History:  Diagnosis Date  . Abnormal Pap smear   . Anxiety   . Back pain 03/07/2014  . Cervical spine disease    Mild  . Chest pain   . Elevated cholesterol   . Ganglion cyst of left foot 12/06/2013  . H/O bilateral breast implants 03/03/2013  . Hematuria 03/28/2014  . Hot flashes 12/06/2013  . Hypertension   . Menopausal symptoms 12/06/2013  . PSVT (paroxysmal supraventricular tachycardia) ([place])    Documented by event recorder  . Rectocele 03/03/2013  . RUQ pain 03/07/2014  . Vaginal discharge 02/24/2014  . Vaginal dryness, menopausal 12/06/2013  . Vaginal Pap smear, abnormal   . Yeast infection 02/24/2014    Past Surgical History:  Procedure Laterality Date  . BREAST ENHANCEMENT SURGERY    . toe biopsy      Family History  Problem Relation Age of Onset  . Dementia Father   . Cancer Mother   . Healthy Daughter   . Healthy Daughter   . Healthy Daughter   . Cancer Maternal Grandmother   . Heart disease Maternal Grandmother   . Diabetes Maternal Grandfather   . Heart attack Paternal Grandmother    Social History:  reports that she has never smoked. She has never used smokeless tobacco. She reports that she does not drink alcohol or use drugs.  Allergies:  Allergies  Allergen Reactions  . Fish Allergy Anaphylaxis and Swelling    Throat closes.  . Bactrim Ds [Sulfamethoxazole-Trimethoprim]     Hives   . Ciprocinonide [Fluocinolone] Hives    " made me feel awful"   . Codeine Other (See Comments)    DROPS PT. BLOOD PRESSURE  . Penicillins     Has patient had a PCN reaction causing immediate rash,  facial/tongue/throat swelling, SOB or lightheadedness with hypotension:unsure Has patient had a PCN reaction causing severe rash involving mucus membranes or skin necrosis:unsure Has patient had a PCN reaction that required hospitalization:No Has patient had a PCN reaction occurring within the last 10 years:No If all of the above answers are "NO", then may proceed with Cephalosporin use. Childhood reaction   . Sulfa Antibiotics Hives, Diarrhea, Nausea Only and Rash    Medications Prior to Admission  Medication Sig Dispense Refill  . acetaminophen (TYLENOL) 500 MG tablet Take 500-10,001 mg by mouth every 6 (six) hours as needed (for pain).    Marland Kitchen ALPRAZolam (XANAX) 0.5 MG tablet Take 1 tablet (0.5 mg total) by mouth at bedtime as needed for anxiety. 30 tablet 0  . Alum & Mag Hydroxide-Simeth (ANTACID ADVANCED PO) Take 5 mLs by mouth 3 (three) times daily as needed (for heartburn/indigestion).     . cetirizine (ZYRTEC) 10 MG tablet Take 10 mg by mouth daily as needed for allergies.     Marland Kitchen doxycycline (VIBRAMYCIN) 50 MG capsule Take 50 mg by mouth daily.    . Multiple Vitamin (MULTIVITAMIN) tablet Take 1 tablet by mouth daily.      . sertraline (ZOLOFT) 25 MG tablet Take 25 mg by mouth daily. Reported on 10/18/2015      No results found for this or any previous visit (  from the past 48 hour(s)). No results found.  ROS  Blood pressure (!) 161/79, pulse 80, temperature 98 F (36.7 C), temperature source Oral, resp. rate 15, height 5\' 6"  (1.676 m), weight 123 lb (55.8 kg), SpO2 99 %. Physical Exam  Constitutional: She appears well-developed and well-nourished.  HENT:  Mouth/Throat: Oropharynx is clear and moist.  Eyes: Conjunctivae are normal. No scleral icterus.  Neck: No thyromegaly present.  Cardiovascular: Normal rate, regular rhythm and normal heart sounds.   No murmur heard. Respiratory: Effort normal and breath sounds normal.  GI: Soft. She exhibits no distension and no mass. There  is no tenderness.  Musculoskeletal: She exhibits no edema.  Lymphadenopathy:    She has no cervical adenopathy.  Neurological: She is alert.  Skin: Skin is warm.     Assessment/Plan History of colonic adenoma. Surveillance colonoscopy.  Hildred Laser, MD 06/25/2016, 12:41 PM

## 2016-06-25 NOTE — Discharge Instructions (Signed)
Resume usual medications and diet. No driving for 24 hours. Physician will call with biopsy results.   Colonoscopy, Care After These instructions give you information on caring for yourself after your procedure. Your doctor may also give you more specific instructions. Call your doctor if you have any problems or questions after your procedure. HOME CARE  Do not drive for 24 hours.  Do not sign important papers or use machinery for 24 hours.  You may shower.  You may go back to your usual activities, but go slower for the first 24 hours.  Take rest breaks often during the first 24 hours.  Walk around or use warm packs on your belly (abdomen) if you have belly cramping or gas.  Drink enough fluids to keep your pee (urine) clear or pale yellow.  Resume your normal diet. Avoid heavy or fried foods.  Avoid drinking alcohol for 24 hours or as told by your doctor.  Only take medicines as told by your doctor. If a tissue sample (biopsy) was taken during the procedure:   Do not take aspirin or blood thinners for 7 days, or as told by your doctor.  Do not drink alcohol for 7 days, or as told by your doctor.  Eat soft foods for the first 24 hours. GET HELP IF: You still have a small amount of blood in your poop (stool) 2-3 days after the procedure. GET HELP RIGHT AWAY IF:  You have more than a small amount of blood in your poop.  You see clumps of tissue (blood clots) in your poop.  Your belly is puffy (swollen).  You feel sick to your stomach (nauseous) or throw up (vomit).  You have a fever.  You have belly pain that gets worse and medicine does not help. MAKE SURE YOU:  Understand these instructions.  Will watch your condition.  Will get help right away if you are not doing well or get worse.   This information is not intended to replace advice given to you by your health care provider. Make sure you discuss any questions you have with your health care provider.     Document Released: 09/20/2010 Document Revised: 08/23/2013 Document Reviewed: 04/25/2013 Elsevier Interactive Patient Education 2016 [employer].   Hemorrhoids Hemorrhoids are swollen veins around the rectum or anus. There are two types of hemorrhoids:   Internal hemorrhoids. These occur in the veins just inside the rectum. They may poke through to the outside and become irritated and painful.  External hemorrhoids. These occur in the veins outside the anus and can be felt as a painful swelling or hard lump near the anus. CAUSES  Pregnancy.   Obesity.   Constipation or diarrhea.   Straining to have a bowel movement.   Sitting for long periods on the toilet.  Heavy lifting or other activity that caused you to strain.  Anal intercourse. SYMPTOMS   Pain.   Anal itching or irritation.   Rectal bleeding.   Fecal leakage.   Anal swelling.   One or more lumps around the anus.  DIAGNOSIS  Your caregiver may be able to diagnose hemorrhoids by visual examination. Other examinations or tests that may be performed include:   Examination of the rectal area with a gloved hand (digital rectal exam).   Examination of anal canal using a small tube (scope).   A blood test if you have lost a significant amount of blood.  A test to look inside the colon (sigmoidoscopy or colonoscopy). TREATMENT Most  hemorrhoids can be treated at home. However, if symptoms do not seem to be getting better or if you have a lot of rectal bleeding, your caregiver may perform a procedure to help make the hemorrhoids get smaller or remove them completely. Possible treatments include:   Placing a rubber band at the base of the hemorrhoid to cut off the circulation (rubber band ligation).   Injecting a chemical to shrink the hemorrhoid (sclerotherapy).   Using a tool to burn the hemorrhoid (infrared light therapy).   Surgically removing the hemorrhoid (hemorrhoidectomy).   Stapling  the hemorrhoid to block blood flow to the tissue (hemorrhoid stapling).  HOME CARE INSTRUCTIONS   Eat foods with fiber, such as whole grains, beans, nuts, fruits, and vegetables. Ask your doctor about taking products with added fiber in them (fibersupplements).  Increase fluid intake. Drink enough water and fluids to keep your urine clear or pale yellow.   Exercise regularly.   Go to the bathroom when you have the urge to have a bowel movement. Do not wait.   Avoid straining to have bowel movements.   Keep the anal area dry and clean. Use wet toilet paper or moist towelettes after a bowel movement.   Medicated creams and suppositories may be used or applied as directed.   Only take over-the-counter or prescription medicines as directed by your caregiver.   Take warm sitz baths for 15-20 minutes, 3-4 times a day to ease pain and discomfort.   Place ice packs on the hemorrhoids if they are tender and swollen. Using ice packs between sitz baths may be helpful.   Put ice in a plastic bag.   Place a towel between your skin and the bag.   Leave the ice on for 15-20 minutes, 3-4 times a day.   Do not use a donut-shaped pillow or sit on the toilet for long periods. This increases blood pooling and pain.  SEEK MEDICAL CARE IF:  You have increasing pain and swelling that is not controlled by treatment or medicine.  You have uncontrolled bleeding.  You have difficulty or you are unable to have a bowel movement.  You have pain or inflammation outside the area of the hemorrhoids. MAKE SURE YOU:  Understand these instructions.  Will watch your condition.  Will get help right away if you are not doing well or get worse.   This information is not intended to replace advice given to you by your health care provider. Make sure you discuss any questions you have with your health care provider.   Document Released: 08/15/2000 Document Revised: 08/04/2012 Document Reviewed:  06/22/2012 Elsevier Interactive Patient Education 2016 [employer].    Diverticulosis Diverticulosis is the condition that develops when small pouches (diverticula) form in the wall of your colon. Your colon, or large intestine, is where water is absorbed and stool is formed. The pouches form when the inside layer of your colon pushes through weak spots in the outer layers of your colon. CAUSES  No one knows exactly what causes diverticulosis. RISK FACTORS  Being older than 67. Your risk for this condition increases with age. Diverticulosis is rare in people younger than 40 years. By age 54, almost everyone has it.  Eating a low-fiber diet.  Being frequently constipated.  Being overweight.  Not getting enough exercise.  Smoking.  Taking over-the-counter pain medicines, like aspirin and ibuprofen. SYMPTOMS  Most people with diverticulosis do not have symptoms. DIAGNOSIS  Because diverticulosis often has no symptoms, health care  providers often discover the condition during an exam for other colon problems. In many cases, a health care provider will diagnose diverticulosis while using a flexible scope to examine the colon (colonoscopy). TREATMENT  If you have never developed an infection related to diverticulosis, you may not need treatment. If you have had an infection before, treatment may include:  Eating more fruits, vegetables, and grains.  Taking a fiber supplement.  Taking a live bacteria supplement (probiotic).  Taking medicine to relax your colon. HOME CARE INSTRUCTIONS   Drink at least 6-8 glasses of water each day to prevent constipation.  Try not to strain when you have a bowel movement.  Keep all follow-up appointments. If you have had an infection before:  Increase the fiber in your diet as directed by your health care provider or dietitian.  Take a dietary fiber supplement if your health care provider approves.  Only take medicines as directed by  your health care provider. SEEK MEDICAL CARE IF:   You have abdominal pain.  You have bloating.  You have cramps.  You have not gone to the bathroom in 3 days. SEEK IMMEDIATE MEDICAL CARE IF:   Your pain gets worse.  Yourbloating becomes very bad.  You have a fever or chills, and your symptoms suddenly get worse.  You begin vomiting.  You have bowel movements that are bloody or black. MAKE SURE YOU:  Understand these instructions.  Will watch your condition.  Will get help right away if you are not doing well or get worse.   This information is not intended to replace advice given to you by your health care provider. Make sure you discuss any questions you have with your health care provider.   Document Released: 05/15/2004 Document Revised: 08/23/2013 Document Reviewed: 07/13/2013 Elsevier Interactive Patient Education 2016 Else[employer] Inc[employer]   Colon Polyps Polyps are lumps of extra tissue growing inside the body. Polyps can grow in the large intestine (colon). Most colon polyps are noncancerous (benign). However, some colon polyps can become cancerous over time. Polyps that are larger than a pea may be harmful. To be safe, caregivers remove and test all polyps. CAUSES  Polyps form when mutations in the genes cause your cells to grow and divide even though no more tissue is needed. RISK FACTORS There are a number of risk factors that can increase your chances of getting colon polyps. They include:  Being older than 50 years.  Family history of colon polyps or colon cancer.  Long-term colon diseases, such as colitis or Crohn disease.  Being overweight.  Smoking.  Being inactive.  Drinking too much alcohol. SYMPTOMS  Most small polyps do not cause symptoms. If symptoms are present, they may include:  Blood in the stool. The stool may look dark red or black.  Constipation or diarrhea that lasts longer than 1 week. DIAGNOSIS People often do not know they have  polyps until their caregiver finds them during a regular checkup. Your caregiver can use 4 tests to check for polyps:  Digital rectal exam. The caregiver wears gloves and feels inside the rectum. This test would find polyps only in the rectum.  Barium enema. The caregiver puts a liquid called barium into your rectum before taking X-rays of your colon. Barium makes your colon look white. Polyps are dark, so they are easy to see in the X-ray pictures.  Sigmoidoscopy. A thin, flexible tube (sigmoidoscope) is placed into your rectum. The sigmoidoscope has a light and tiny camera in it.  The caregiver uses the sigmoidoscope to look at the last third of your colon.  Colonoscopy. This test is like sigmoidoscopy, but the caregiver looks at the entire colon. This is the most common method for finding and removing polyps. TREATMENT  Any polyps will be removed during a sigmoidoscopy or colonoscopy. The polyps are then tested for cancer. PREVENTION  To help lower your risk of getting more colon polyps:  Eat plenty of fruits and vegetables. Avoid eating fatty foods.  Do not smoke.  Avoid drinking alcohol.  Exercise every day.  Lose weight if recommended by your caregiver.  Eat plenty of calcium and folate. Foods that are rich in calcium include milk, cheese, and broccoli. Foods that are rich in folate include chickpeas, kidney beans, and spinach. HOME CARE INSTRUCTIONS Keep all follow-up appointments as directed by your caregiver. You may need periodic exams to check for polyps. SEEK MEDICAL CARE IF: You notice bleeding during a bowel movement.   This information is not intended to replace advice given to you by your health care provider. Make sure you discuss any questions you have with your health care provider.   Document Released: 05/14/2004 Document Revised: 09/08/2014 Document Reviewed: 10/28/2011 Elsevier Interactive Patient Education [employer].

## 2016-06-25 NOTE — Op Note (Signed)
[facility] Patient Name: April Knox Procedure Date: 06/25/2016 12:41 PM MRN: [id number] Date of Birth: Mar 01, 1956 Attending MD: Hildred Laser , MD CSN: [id number] Age: 60 Admit Type: Outpatient Procedure:                Colonoscopy Indications:              High risk colon cancer surveillance: Personal                            history of colonic polyps Providers:                Hildred Laser, MD, Otis Peak B. Sharon Seller, RN, Sherlyn Lees, Technician Referring MD:             Stoney Bang MD, MD Medicines:                Meperidine 50 mg IV, Midazolam 6 mg IV Complications:            No immediate complications. Estimated Blood Loss:     Estimated blood loss was minimal. Procedure:                Pre-Anesthesia Assessment:                           - Prior to the procedure, a History and Physical                            was performed, and patient medications and                            allergies were reviewed. The patient's tolerance of                            previous anesthesia was also reviewed. The risks                            and benefits of the procedure and the sedation                            options and risks were discussed with the patient.                            All questions were answered, and informed consent                            was obtained. Prior Anticoagulants: The patient has                            taken no previous anticoagulant or antiplatelet                            agents. ASA Grade Assessment: II - A patient with  mild systemic disease. After reviewing the risks                            and benefits, the patient was deemed in                            satisfactory condition to undergo the procedure.                           After obtaining informed consent, the colonoscope                            was passed under direct vision. Throughout the           procedure, the patient's blood pressure, pulse, and                            oxygen saturations were monitored continuously. The                            [id number]) was introduced through the                            anus and advanced to the the cecum, identified by                            appendiceal orifice and ileocecal valve. The                            colonoscopy was performed without difficulty. The                            patient tolerated the procedure well. The quality                            of the bowel preparation was adequate. The                            ileocecal valve, appendiceal orifice, and rectum                            were photographed. Scope In: 12:50:30 PM Scope Out: 1:10:32 PM Scope Withdrawal Time: 0 hours 8 minutes 24 seconds  Total Procedure Duration: 0 hours 20 minutes 2 seconds  Findings:      A single medium-mouthed diverticulum was found in the ascending colon.      A 3 mm polyp was found in the hepatic flexure. The polyp was sessile.       Biopsies were taken with a cold forceps for histology.      The exam was otherwise without abnormality.      External hemorrhoids were found during retroflexion. The hemorrhoids       were small. Impression:               - Diverticulosis in the ascending colon.                           -  One 3 mm polyp at the hepatic flexure. Biopsied.                           - The examination was otherwise normal.                           - External hemorrhoids. Moderate Sedation:      Moderate (conscious) sedation was administered by the endoscopy nurse       and supervised by the endoscopist. The following parameters were       monitored: oxygen saturation, heart rate, blood pressure, CO2       capnography and response to care. Total physician intraservice time was       24 minutes. Recommendation:           - Patient has a contact number available for                             emergencies. The signs and symptoms of potential                            delayed complications were discussed with the                            patient. Return to normal activities tomorrow.                            Written discharge instructions were provided to the                            patient.                           - Resume previous diet today.                           - Continue present medications.                           - Await pathology results.                           - Repeat colonoscopy for surveillance based on                            pathology results. Procedure Code(s):        --- Professional ---                           [phone], Colonoscopy, flexible; with biopsy, single                            or multiple                           99152, Moderate sedation services provided by the  same physician or other qualified health care                            professional performing the diagnostic or                            therapeutic service that the sedation supports,                            requiring the presence of an independent trained                            observer to assist in the monitoring of the                            patient's level of consciousness and physiological                            status; initial 15 minutes of intraservice time,                            patient age 97 years or older                           [phone], Moderate sedation services; each additional                            15 minutes intraservice time Diagnosis Code(s):        --- Professional ---                           Z86.010, Personal history of colonic polyps                           K64.4, Residual hemorrhoidal skin tags                           D12.3, Benign neoplasm of transverse colon (hepatic                            flexure or splenic flexure)                           K57.30, Diverticulosis of large  intestine without                            perforation or abscess without bleeding CPT copyright 2016 American Medical Association. All rights reserved. The codes documented in this report are preliminary and upon coder review may  be revised to meet current compliance requirements. Hildred Laser, MD Hildred Laser, MD 06/25/2016 1:17:45 PM This report has been signed electronically. Number of Addenda: 0

## 2016-06-27 ENCOUNTER — Telehealth: Payer: Self-pay | Admitting: Adult Health

## 2016-06-27 ENCOUNTER — Encounter (HOSPITAL_COMMUNITY): Payer: Self-pay | Admitting: Internal Medicine

## 2016-06-27 MED ORDER — ALPRAZOLAM 0.5 MG PO TABS: 0.5000 mg | ORAL_TABLET | Freq: Every evening | ORAL | 0 refills | Status: DC | PRN

## 2016-06-27 NOTE — Telephone Encounter (Signed)
Had colonosocpy Wednesday with Dr Laural Golden, had 1 polyp removed.She is nervous and requests refill on her xanax

## 2016-06-30 ENCOUNTER — Telehealth (INDEPENDENT_AMBULATORY_CARE_PROVIDER_SITE_OTHER): Payer: Self-pay | Admitting: *Deleted

## 2016-06-30 NOTE — Telephone Encounter (Signed)
Phone call from patient -- still having a light headed & tired feeling and she's groggy -- is this from sedation  Patient also states she's had an issue with a nurse in endo, can't remember her name -- (1) the nurse "blew her vein" and made a joke of it; (2) then the nurse tried to tell her she didn't need sedation for procedure, that people have it all the time with out sedation; (3)  Then when the nurse asked her to change in to gown, April Knox asked if she needed to take her panties off and April Knox stated the nurse told her "no to leave them on because the doctor liked to draw smiley faces on them and go in from there". April Knox was very upset about this

## 2016-06-30 NOTE — Telephone Encounter (Signed)
This will be discussed with Dr.Rehman. 

## 2016-07-01 NOTE — Progress Notes (Signed)
Patient's call returned She is feeling much better today. She forget oh she bought a bit mildly dehydrated and drinking more fluids and Gatorade. I believe a statement was not negative in any way and was meant to chair up the patient but will follow-up.

## 2016-07-02 NOTE — Telephone Encounter (Signed)
This was discussed with Dr.Rehman.  He has talked with the patient .

## 2016-08-09 ENCOUNTER — Other Ambulatory Visit: Payer: Self-pay | Admitting: Adult Health

## 2016-08-20 DIAGNOSIS — Z85828 Personal history of other malignant neoplasm of skin: Secondary | ICD-10-CM | POA: Diagnosis not present

## 2016-08-20 DIAGNOSIS — L818 Other specified disorders of pigmentation: Secondary | ICD-10-CM | POA: Diagnosis not present

## 2016-08-20 DIAGNOSIS — L570 Actinic keratosis: Secondary | ICD-10-CM | POA: Diagnosis not present

## 2016-08-28 DIAGNOSIS — I10 Essential (primary) hypertension: Secondary | ICD-10-CM | POA: Diagnosis not present

## 2016-08-28 DIAGNOSIS — R5383 Other fatigue: Secondary | ICD-10-CM | POA: Diagnosis not present

## 2016-09-11 DIAGNOSIS — N6489 Other specified disorders of breast: Secondary | ICD-10-CM | POA: Diagnosis not present

## 2016-09-11 DIAGNOSIS — Z6820 Body mass index (BMI) 20.0-20.9, adult: Secondary | ICD-10-CM | POA: Diagnosis not present

## 2016-09-22 ENCOUNTER — Other Ambulatory Visit: Payer: Self-pay | Admitting: Adult Health

## 2016-09-22 ENCOUNTER — Telehealth: Payer: Self-pay | Admitting: Adult Health

## 2016-09-22 NOTE — Telephone Encounter (Signed)
Pt aware Xanax was refilled today. [place]

## 2016-10-14 ENCOUNTER — Telehealth: Payer: Self-pay | Admitting: Adult Health

## 2016-10-14 NOTE — Telephone Encounter (Signed)
Pt called stating that she just left the surgeon's office and would like to ask [name] a question, Pt states that it is urgent. Please contact pt
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
## 2016-10-15 ENCOUNTER — Telehealth: Payer: Self-pay | Admitting: Adult Health

## 2016-10-15 NOTE — Telephone Encounter (Signed)
Started Hydrodiuril yesterday feel light headed and clammy. BP was 102/74, worked today, talked to pharmacist just took 1/2 tab today, mouth dry.Check BP daily.

## 2016-10-17 ENCOUNTER — Telehealth: Payer: Self-pay | Admitting: Adult Health

## 2016-10-17 ENCOUNTER — Telehealth: Payer: Self-pay | Admitting: Cardiology

## 2016-10-17 NOTE — Telephone Encounter (Signed)
Pt taking 12.5 Microzide and BP good.

## 2016-10-17 NOTE — Telephone Encounter (Signed)
Patient advised to follow instructions given by Dr. Sherrie Sport & follow up with him if her b/p remains elevated. Patient voiced understanding.

## 2016-10-17 NOTE — Telephone Encounter (Signed)
Patient walked into office stating that she is scheduled for outpatient surgery on Tuesday. Was told that her BP is elevated. Dr. Sherrie Sport gave her new RX of Hydrochlorothiazide 25 mg . Patient said that she was told they would not do surgery if he BP is not under control.  Patient would like a call.

## 2016-10-17 NOTE — Telephone Encounter (Signed)
Pt called stating that she spoke with [name] earlier this week and was instructed to call today and speak with her. Please contact pt

## 2016-10-18 DIAGNOSIS — K529 Noninfective gastroenteritis and colitis, unspecified: Secondary | ICD-10-CM | POA: Diagnosis not present

## 2016-10-18 DIAGNOSIS — E876 Hypokalemia: Secondary | ICD-10-CM | POA: Diagnosis not present

## 2016-10-18 DIAGNOSIS — E860 Dehydration: Secondary | ICD-10-CM | POA: Diagnosis not present

## 2016-10-18 DIAGNOSIS — R05 Cough: Secondary | ICD-10-CM | POA: Diagnosis not present

## 2016-10-18 DIAGNOSIS — Z79899 Other long term (current) drug therapy: Secondary | ICD-10-CM | POA: Diagnosis not present

## 2016-10-21 ENCOUNTER — Telehealth: Payer: Self-pay | Admitting: *Deleted

## 2016-10-21 NOTE — Telephone Encounter (Signed)
Breast surgery cancelled BP elevated and was weak from GI

## 2016-10-23 DIAGNOSIS — Z79899 Other long term (current) drug therapy: Secondary | ICD-10-CM | POA: Diagnosis not present

## 2016-10-28 ENCOUNTER — Other Ambulatory Visit: Payer: Self-pay | Admitting: Adult Health

## 2016-11-04 ENCOUNTER — Telehealth: Payer: Self-pay | Admitting: Adult Health

## 2016-11-04 NOTE — Telephone Encounter (Signed)
Has surgery on breast scheduled for 11/06/16.

## 2016-11-04 NOTE — Telephone Encounter (Signed)
Pt called stating that she would like a call back from [place] did not state the reason why. Please contact pt

## 2016-11-06 DIAGNOSIS — T8549XA Other mechanical complication of breast prosthesis and implant, initial encounter: Secondary | ICD-10-CM | POA: Diagnosis not present

## 2016-11-12 DIAGNOSIS — L603 Nail dystrophy: Secondary | ICD-10-CM | POA: Diagnosis not present

## 2016-11-26 ENCOUNTER — Other Ambulatory Visit: Payer: Self-pay | Admitting: Adult Health

## 2016-12-08 ENCOUNTER — Ambulatory Visit: Payer: Medicare Other | Admitting: Adult Health

## 2016-12-09 ENCOUNTER — Ambulatory Visit (INDEPENDENT_AMBULATORY_CARE_PROVIDER_SITE_OTHER): Payer: Medicare Other | Admitting: Adult Health

## 2016-12-09 ENCOUNTER — Encounter: Payer: Self-pay | Admitting: Adult Health

## 2016-12-09 VITALS — BP 150/60 | HR 90 | Wt 130.4 lb

## 2016-12-09 DIAGNOSIS — F419 Anxiety disorder, unspecified: Secondary | ICD-10-CM | POA: Diagnosis not present

## 2016-12-09 DIAGNOSIS — Z9889 Other specified postprocedural states: Secondary | ICD-10-CM

## 2016-12-09 MED ORDER — ALPRAZOLAM 1 MG PO TABS: 1.0000 mg | ORAL_TABLET | Freq: Every evening | ORAL | 1 refills | Status: DC | PRN
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
## 2016-12-16 DIAGNOSIS — L309 Dermatitis, unspecified: Secondary | ICD-10-CM | POA: Diagnosis not present

## 2016-12-23 DIAGNOSIS — Z6821 Body mass index (BMI) 21.0-21.9, adult: Secondary | ICD-10-CM | POA: Diagnosis not present

## 2016-12-23 DIAGNOSIS — F410 Panic disorder [episodic paroxysmal anxiety] without agoraphobia: Secondary | ICD-10-CM | POA: Diagnosis not present

## 2016-12-31 ENCOUNTER — Telehealth: Payer: Self-pay | Admitting: Adult Health

## 2016-12-31 NOTE — Telephone Encounter (Signed)
Pt having labs 5/8 K+ in CMP

## 2016-12-31 NOTE — Telephone Encounter (Signed)
Pt called stating that she has an appointment for labs on 5/8 and she would like for [facility] to add in to check her potasium level as well. Pt states that she is on some blood pressure medication and water pill and they told her it could bring down her potassium. please contact pt

## 2017-01-06 ENCOUNTER — Encounter: Payer: Self-pay | Admitting: Adult Health

## 2017-01-06 ENCOUNTER — Ambulatory Visit (INDEPENDENT_AMBULATORY_CARE_PROVIDER_SITE_OTHER): Payer: Medicare Other | Admitting: Adult Health

## 2017-01-06 ENCOUNTER — Other Ambulatory Visit: Payer: Medicare Other

## 2017-01-06 VITALS — BP 144/62 | HR 84 | Ht 66.0 in | Wt 133.0 lb

## 2017-01-06 DIAGNOSIS — E7800 Pure hypercholesterolemia, unspecified: Secondary | ICD-10-CM | POA: Diagnosis not present

## 2017-01-06 DIAGNOSIS — J301 Allergic rhinitis due to pollen: Secondary | ICD-10-CM

## 2017-01-06 DIAGNOSIS — E079 Disorder of thyroid, unspecified: Secondary | ICD-10-CM

## 2017-01-06 DIAGNOSIS — R5383 Other fatigue: Secondary | ICD-10-CM | POA: Insufficient documentation

## 2017-01-06 DIAGNOSIS — Z130 Encounter for screening for diseases of the blood and blood-forming organs and certain disorders involving the immune mechanism: Secondary | ICD-10-CM

## 2017-01-06 DIAGNOSIS — E876 Hypokalemia: Secondary | ICD-10-CM

## 2017-01-06 DIAGNOSIS — Z0189 Encounter for other specified special examinations: Secondary | ICD-10-CM

## 2017-01-06 MED ORDER — CETIRIZINE HCL 10 MG PO TABS: 10.0000 mg | ORAL_TABLET | Freq: Every day | ORAL | 99 refills | Status: DC

## 2017-01-06 NOTE — Progress Notes (Signed)
Subjective:     [name], female   DOB: 02-Jun-1956, 61 y.o.   MRN: [id number]  HPI April Knox is a 61 year old white female in for fasting labs and says allergies worse and is tired, but works a lot, and she has gained a few lbs.   Review of Systems Allergies worse Tired  Has gained a few lbs Reviewed past medical,surgical, social and family history. Reviewed medications and allergies.     Objective:   Physical Exam BP (!) 144/62 (BP Location: Right Arm, Patient Position: Sitting, Cuff Size: Normal)   Pulse 84   Ht 5\' 6"  (1.676 m)   Wt 133 lb (60.3 kg)   BMI 21.47 kg/m  Skin warm and dry. Lungs: clear to ausculation bilaterally. Cardiovascular: regular rate and rhythm.    Assessment:     1. Seasonal allergic rhinitis due to pollen   2. Other fatigue       Plan:     Meds ordered this encounter  Medications  . cetirizine (ZYRTEC) 10 MG tablet    Sig: Take 1 tablet (10 mg total) by mouth daily.    Dispense:  30 tablet    Refill:  prn    Order Specific Question:   Supervising Provider    Answer:   Tania Ade H [[id number]]  Fasting labs before visit, of CBC,CMP,TSH and lipids Follow up prn

## 2017-01-07 ENCOUNTER — Telehealth: Payer: Self-pay | Admitting: *Deleted

## 2017-01-07 ENCOUNTER — Telehealth: Payer: Self-pay | Admitting: Adult Health

## 2017-01-07 LAB — COMPREHENSIVE METABOLIC PANEL
ALBUMIN: 4.2 g/dL (ref 3.6–4.8)
ALK PHOS: 81 IU/L (ref 39–117)
ALT: 18 IU/L (ref 0–32)
AST: 21 IU/L (ref 0–40)
Albumin/Globulin Ratio: 1.8 (ref 1.2–2.2)
BUN/Creatinine Ratio: 23 (ref 12–28)
BUN: 14 mg/dL (ref 8–27)
Bilirubin Total: 0.5 mg/dL (ref 0.0–1.2)
CO2: 30 mmol/L — AB (ref 18–29)
CREATININE: 0.61 mg/dL (ref 0.57–1.00)
Calcium: 9.1 mg/dL (ref 8.7–10.3)
Chloride: 99 mmol/L (ref 96–106)
GFR calc Af Amer: 113 mL/min/{1.73_m2} (ref >59)
GFR calc non Af Amer: 98 mL/min/{1.73_m2} (ref >59)
GLUCOSE: 95 mg/dL (ref 65–99)
Globulin, Total: 2.4 g/dL (ref 1.5–4.5)
Potassium: 4.1 mmol/L (ref 3.5–5.2)
Sodium: 143 mmol/L (ref 134–144)
Total Protein: 6.6 g/dL (ref 6.0–8.5)

## 2017-01-07 LAB — LIPID PANEL
CHOLESTEROL TOTAL: 203 mg/dL — AB (ref 100–199)
Chol/HDL Ratio: 3.2 ratio (ref 0.0–4.4)
HDL: 64 mg/dL (ref >39)
LDL Calculated: 123 mg/dL — ABNORMAL HIGH (ref 0–99)
Triglycerides: 80 mg/dL (ref 0–149)
VLDL CHOLESTEROL CAL: 16 mg/dL (ref 5–40)

## 2017-01-07 LAB — CBC
HEMOGLOBIN: 14.4 g/dL (ref 11.1–15.9)
Hematocrit: 43.4 % (ref 34.0–46.6)
MCH: 31.6 pg (ref 26.6–33.0)
MCHC: 33.2 g/dL (ref 31.5–35.7)
MCV: 95 fL (ref 79–97)
Platelets: 276 10*3/uL (ref 150–379)
RBC: 4.56 x10E6/uL (ref 3.77–5.28)
RDW: 13.9 % (ref 12.3–15.4)
WBC: 4.8 10*3/uL (ref 3.4–10.8)

## 2017-01-07 LAB — TSH: TSH: 0.517 u[IU]/mL (ref 0.450–4.500)

## 2017-01-07 NOTE — Telephone Encounter (Signed)
Pt aware that labs are good, even with elevated LDL, the HDL is really good, so stay active and decease carbs

## 2017-01-07 NOTE — Telephone Encounter (Signed)
Patient called for lab results. Informed of labs. Patient concerned about cholesterol.Please advise.

## 2017-01-15 ENCOUNTER — Other Ambulatory Visit: Payer: Self-pay | Admitting: Adult Health

## 2017-01-15 DIAGNOSIS — M07671 Enteropathic arthropathies, right ankle and foot: Secondary | ICD-10-CM | POA: Diagnosis not present

## 2017-01-15 DIAGNOSIS — Z6821 Body mass index (BMI) 21.0-21.9, adult: Secondary | ICD-10-CM | POA: Diagnosis not present

## 2017-01-28 DIAGNOSIS — B351 Tinea unguium: Secondary | ICD-10-CM | POA: Diagnosis not present

## 2017-01-28 DIAGNOSIS — D2370 Other benign neoplasm of skin of unspecified lower limb, including hip: Secondary | ICD-10-CM | POA: Diagnosis not present

## 2017-02-09 DIAGNOSIS — A681 Tick-borne relapsing fever: Secondary | ICD-10-CM | POA: Diagnosis not present

## 2017-02-18 DIAGNOSIS — S6991XA Unspecified injury of right wrist, hand and finger(s), initial encounter: Secondary | ICD-10-CM | POA: Diagnosis not present

## 2017-02-18 DIAGNOSIS — M79641 Pain in right hand: Secondary | ICD-10-CM | POA: Diagnosis not present

## 2017-02-18 DIAGNOSIS — M79644 Pain in right finger(s): Secondary | ICD-10-CM | POA: Diagnosis not present

## 2017-02-23 ENCOUNTER — Encounter: Payer: Self-pay | Admitting: *Deleted

## 2017-02-23 DIAGNOSIS — L570 Actinic keratosis: Secondary | ICD-10-CM | POA: Diagnosis not present

## 2017-02-23 DIAGNOSIS — D1800 Hemangioma unspecified site: Secondary | ICD-10-CM | POA: Diagnosis not present

## 2017-02-23 DIAGNOSIS — Z85828 Personal history of other malignant neoplasm of skin: Secondary | ICD-10-CM | POA: Diagnosis not present

## 2017-03-07 ENCOUNTER — Other Ambulatory Visit: Payer: Self-pay | Admitting: Adult Health

## 2017-03-30 ENCOUNTER — Encounter: Payer: Self-pay | Admitting: Adult Health

## 2017-03-30 ENCOUNTER — Ambulatory Visit (INDEPENDENT_AMBULATORY_CARE_PROVIDER_SITE_OTHER): Payer: Medicare Other | Admitting: Adult Health

## 2017-03-30 VITALS — BP 136/74 | HR 64 | Ht 66.0 in | Wt 127.0 lb

## 2017-03-30 DIAGNOSIS — Z01419 Encounter for gynecological examination (general) (routine) without abnormal findings: Secondary | ICD-10-CM | POA: Insufficient documentation

## 2017-03-30 DIAGNOSIS — Z1211 Encounter for screening for malignant neoplasm of colon: Secondary | ICD-10-CM | POA: Diagnosis not present

## 2017-03-30 DIAGNOSIS — Z1212 Encounter for screening for malignant neoplasm of rectum: Secondary | ICD-10-CM | POA: Diagnosis not present

## 2017-03-30 DIAGNOSIS — F419 Anxiety disorder, unspecified: Secondary | ICD-10-CM

## 2017-03-30 LAB — HEMOCCULT GUIAC POC 1CARD (OFFICE): Fecal Occult Blood, POC: NEGATIVE

## 2017-03-30 MED ORDER — ALPRAZOLAM 1 MG PO TABS
ORAL_TABLET | ORAL | 1 refills | Status: DC
Start: 2017-03-30 — End: 2017-05-27

## 2017-03-30 NOTE — Progress Notes (Signed)
[name], female   DOB: Dec 25, 1955, 61 y.o.   MRN: [id number] History of Present Illness:  April Knox is a 61 year old white female, widowed, in for well woman gyn exam, she had a normal pap with negative HPV 03/28/16. She says she is stressed more, grandson who is 29 moved in with her and her bills have gone up.She is requesting increase on how often is can take a xanax, feels like needs more than once a day at times.  PCP Dr April Knox.  Current Medications, Allergies, Past Medical History, Past Surgical History, Family History and Social History were reviewed in [employer] record.     Review of Systems: Patient denies any headaches, hearing loss, fatigue, blurred vision, shortness of breath, chest pain, abdominal pain, problems with bowel movements, urination, or intercourse(not having sex). No joint pain or mood swings. +stress and is more anxious.    Physical Exam:BP 136/74 (BP Location: Left Arm, Patient Position: Sitting, Cuff Size: Normal)   Pulse 64   Ht 5\' 6"  (1.676 m)   Wt 127 lb (57.6 kg)   BMI 20.50 kg/m  General:  Well developed, well nourished, no acute distress Skin:  Warm and dry Neck:  Midline trachea, normal thyroid, good ROM, no lymphadenopathy Lungs; Clear to auscultation bilaterally Breast:  No dominant palpable mass, retraction, or nipple discharge,has bilateral implants Cardiovascular: Regular rate and rhythm Abdomen:  Soft, non tender, no hepatosplenomegaly Pelvic:  External genitalia is normal in appearance, no lesions.  The vagina is normal in appearance. Urethra has no lesions or masses. The cervix is smooth.  Uterus is felt to be normal size, shape, and contour.  No adnexal masses or tenderness noted.Bladder is non tender, no masses felt. Rectal: Good sphincter tone, no polyps, or hemorrhoids felt.  Hemoccult negative. Extremities/musculoskeletal:  No swelling or varicosities noted, no clubbing or cyanosis Psych:  No mood  changes, alert and cooperative,seems happy, but stressed  PHQ 2 score 0.   Impression: 1. Well woman exam with routine gynecological exam   2. Screening for colorectal cancer   3. Anxiety       Plan: Meds ordered this encounter  Medications  . ALPRAZolam (XANAX) 1 MG tablet    Sig: TAKE ONE TABLET bid prn anxiety    Dispense:  45 tablet    Refill:  1    Order Specific Question:   Supervising Provider    Answer:   April Knox [[id number]]  Physical in 1 year, pap in 2020 Mammogram yearly Talk with Social worker, try [employer] and area churches for help right now, with power bill and food.  Had labs in May, none needed now.

## 2017-04-13 DIAGNOSIS — F410 Panic disorder [episodic paroxysmal anxiety] without agoraphobia: Secondary | ICD-10-CM | POA: Diagnosis not present

## 2017-05-27 ENCOUNTER — Other Ambulatory Visit: Payer: Self-pay | Admitting: Adult Health

## 2017-06-22 DIAGNOSIS — Z23 Encounter for immunization: Secondary | ICD-10-CM | POA: Diagnosis not present

## 2017-06-25 DIAGNOSIS — K130 Diseases of lips: Secondary | ICD-10-CM | POA: Diagnosis not present

## 2017-06-25 DIAGNOSIS — L309 Dermatitis, unspecified: Secondary | ICD-10-CM | POA: Diagnosis not present

## 2017-07-01 DIAGNOSIS — R002 Palpitations: Secondary | ICD-10-CM | POA: Diagnosis not present

## 2017-07-01 DIAGNOSIS — R7303 Prediabetes: Secondary | ICD-10-CM | POA: Diagnosis not present

## 2017-07-01 DIAGNOSIS — F410 Panic disorder [episodic paroxysmal anxiety] without agoraphobia: Secondary | ICD-10-CM | POA: Diagnosis not present

## 2017-07-01 DIAGNOSIS — E7849 Other hyperlipidemia: Secondary | ICD-10-CM | POA: Diagnosis not present

## 2017-07-01 DIAGNOSIS — Z1389 Encounter for screening for other disorder: Secondary | ICD-10-CM | POA: Diagnosis not present

## 2017-07-01 DIAGNOSIS — Z0000 Encounter for general adult medical examination without abnormal findings: Secondary | ICD-10-CM | POA: Diagnosis not present

## 2017-07-06 ENCOUNTER — Telehealth: Payer: Self-pay | Admitting: *Deleted

## 2017-07-06 NOTE — Telephone Encounter (Signed)
Pt has labs with PCP Dr Telford Nab and he wants her to take propranolol to slow heart rate, has had episodes of tachycardia, and when laid down it was better.Has appt with cardiology Wednesday. Keep appt and keep me posted

## 2017-07-07 ENCOUNTER — Telehealth: Payer: Self-pay | Admitting: Cardiology

## 2017-07-07 NOTE — Telephone Encounter (Signed)
Pt c/o 2 episodes of palpitations in the last month. Denies CP/SOB/dizziness/weight gain or any other symptoms. pcp suggested Toprol XL (pt didn't know what dose) but says she hasn't had this medication filled at the pharmacy yet and wanted to talk to Dr Harl Bowie regarding this. Appt with extender tomorrow that pt says she will try to go to.

## 2017-07-07 NOTE — Progress Notes (Deleted)
Cardiology Office Note    Date:  07/07/2017   ID:  April Knox, DOB 1956/07/18, MRN [id number]  PCP:  [name], MD  Cardiologist: Dr. Harl Bowie   No chief complaint on file.   History of Present Illness:    April Knox is a 61 y.o. female with past medical history of pSVT, HTN, and anxiety who presents to the office today for evaluation of recurrent palpitations.   She was last examined by Dr. Harl Bowie in 09/2016 for palpitations which awoke her from sleep. She denied any associated symptoms at that time but did report increased work-related stress. Was not initiated on medical therapy at that time due to infrequency of her symptoms.     Past Medical History:  Diagnosis Date  . Abnormal Pap smear   . Anxiety   . Back pain 03/07/2014  . Cervical spine disease    Mild  . Chest pain   . Elevated cholesterol   . Ganglion cyst of left foot 12/06/2013  . H/O bilateral breast implants 03/03/2013  . Hematuria 03/28/2014  . Hot flashes 12/06/2013  . Hypertension   . Menopausal symptoms 12/06/2013  . Mental disorder    anxiety  . PSVT (paroxysmal supraventricular tachycardia) ([place])    Documented by event recorder  . Rectocele 03/03/2013  . RUQ pain 03/07/2014  . Vaginal discharge 02/24/2014  . Vaginal dryness, menopausal 12/06/2013  . Vaginal Pap smear, abnormal   . Yeast infection 02/24/2014    Past Surgical History:  Procedure Laterality Date  . BREAST ENHANCEMENT SURGERY    . toe biopsy      Current Medications: Outpatient Medications Prior to Visit  Medication Sig Dispense Refill  . acetaminophen (TYLENOL) 500 MG tablet Take 500-10,001 mg by mouth every 6 (six) hours as needed (for pain).    Marland Kitchen ALPRAZolam (XANAX) 1 MG tablet TAKE ONE TABLET BY MOUTH TWICE DAILY AS NEEDED FOR ANXIETY. 45 tablet 1  . Multiple Vitamin (MULTIVITAMIN) tablet Take 1 tablet by mouth daily.      . sertraline (ZOLOFT) 25 MG tablet Take 25 mg by mouth daily. Reported on 10/18/2015     No  facility-administered medications prior to visit.      Allergies:   Fish allergy; Bactrim ds [sulfamethoxazole-trimethoprim]; Ciprocinonide [fluocinolone]; Codeine; Penicillins; and Sulfa antibiotics   Social History   Socioeconomic History  . Marital status: Widowed    Spouse name: Not on file  . Number of children: 3  . Years of education: Not on file  . Highest education level: Not on file  Social Needs  . Financial resource strain: Not on file  . Food insecurity - worry: Not on file  . Food insecurity - inability: Not on file  . Transportation needs - medical: Not on file  . Transportation needs - non-medical: Not on file  Occupational History  . Occupation: Unemployed  Tobacco Use  . Smoking status: Never Smoker  . Smokeless tobacco: Never Used  Substance and Sexual Activity  . Alcohol use: No    Alcohol/week: 0.0 oz  . Drug use: No  . Sexual activity: Yes    Birth control/protection: Post-menopausal  Other Topics Concern  . Not on file  Social History Narrative  . Not on file     Family History:  The patient's ***family history includes Cancer in her maternal grandmother and mother; Dementia in her father; Diabetes in her maternal grandfather; Healthy in her daughter, daughter, and daughter; Heart attack in her paternal  grandmother; Heart disease in her maternal grandmother.   Review of Systems:   Please see the history of present illness.     General:  No chills, fever, night sweats or weight changes.  Cardiovascular:  No chest pain, dyspnea on exertion, edema, orthopnea, palpitations, paroxysmal nocturnal dyspnea. Dermatological: No rash, lesions/masses Respiratory: No cough, dyspnea Urologic: No hematuria, dysuria Abdominal:   No nausea, vomiting, diarrhea, bright red blood per rectum, melena, or hematemesis Neurologic:  No visual changes, wkns, changes in mental status. All other systems reviewed and are otherwise negative except as noted above.   Physical  Exam:    VS:  There were no vitals taken for this visit.   General: Well developed, well nourished,female appearing in no acute distress. Head: Normocephalic, atraumatic, sclera non-icteric, no xanthomas, nares are without discharge.  Neck: No carotid bruits. JVD not elevated.  Lungs: Respirations regular and unlabored, without wheezes or rales.  Heart: ***Regular rate and rhythm. No S3 or S4.  No murmur, no rubs, or gallops appreciated. Abdomen: Soft, non-tender, non-distended with normoactive bowel sounds. No hepatomegaly. No rebound/guarding. No obvious abdominal masses. Msk:  Strength and tone appear normal for age. No joint deformities or effusions. Extremities: No clubbing or cyanosis. No edema.  Distal pedal pulses are 2+ bilaterally. Neuro: Alert and oriented X 3. Moves all extremities spontaneously. No focal deficits noted. Psych:  Responds to questions appropriately with a normal affect. Skin: No rashes or lesions noted  Wt Readings from Last 3 Encounters:  03/30/17 127 lb (57.6 kg)  01/06/17 133 lb (60.3 kg)  12/09/16 130 lb 6.4 oz (59.1 kg)        Studies/Labs Reviewed:   EKG:  EKG is*** ordered today.  The ekg ordered today demonstrates ***  Recent Labs: 01/06/2017: ALT 18; BUN 14; Creatinine, Ser 0.61; Hemoglobin 14.4; Platelets 276; Potassium 4.1; Sodium 143; TSH 0.517   Lipid Panel    Component Value Date/Time   CHOL 203 (H) 01/06/2017 0933   TRIG 80 01/06/2017 0933   HDL 64 01/06/2017 0933   CHOLHDL 3.2 01/06/2017 0933   CHOLHDL 2.9 11/01/2013 1428   VLDL 13 11/01/2013 1428   LDLCALC 123 (H) 01/06/2017 0933    Additional studies/ records that were reviewed today include:  ***  Assessment:    No diagnosis found.   Plan:   In order of problems listed above:  1. ***    Medication Adjustments/Labs and Tests Ordered: Current medicines are reviewed at length with the patient today.  Concerns regarding medicines are outlined above.  Medication  changes, Labs and Tests ordered today are listed in the Patient Instructions below. There are no Patient Instructions on file for this visit.   Signed, Erma Heritage, PA-C  07/07/2017 9:03 PM    [facility] [address], [place], [place]  [address] Phone: [phone]; Fax: [phone]  [address], [place], [place] [address] Phone: [phone]

## 2017-07-07 NOTE — Telephone Encounter (Signed)
Patient called asking about being seen earlier by Dr Harl Bowie. Stated she has been having problems and that it is not convenient (due to work schedule) for her to go to [facility] and see NP/PA. Wanted to see Branch.

## 2017-07-08 ENCOUNTER — Ambulatory Visit: Payer: Self-pay | Admitting: Physician Assistant

## 2017-07-08 ENCOUNTER — Encounter: Payer: Self-pay | Admitting: Cardiology

## 2017-07-08 ENCOUNTER — Ambulatory Visit: Payer: Self-pay | Admitting: Student

## 2017-07-10 ENCOUNTER — Telehealth: Payer: Self-pay | Admitting: Adult Health

## 2017-07-10 NOTE — Telephone Encounter (Signed)
Pt aware that I reviewed labs, cut carbs. To see cardiologist 1/19 at 3 pm

## 2017-07-15 DIAGNOSIS — T8549XA Other mechanical complication of breast prosthesis and implant, initial encounter: Secondary | ICD-10-CM | POA: Diagnosis not present

## 2017-07-16 NOTE — Progress Notes (Deleted)
Cardiology Office Note    Date:  07/16/2017   ID:  April Knox, DOB 1956-01-18, MRN [id number]  PCP:  [name], MD  Cardiologist: Dr. Harl Bowie   No chief complaint on file.   History of Present Illness:    April Knox is a 61 y.o. female with past medical history of pSVT, HTN, and anxiety who presents to the office today for evaluation of recurrent palpitations.   She was last examined by Dr. Harl Bowie in 09/2016 for palpitations which awoke her from sleep. She denied any associated symptoms at that time but did report increased work-related stress. Was not initiated on medical therapy at that time due to infrequency of her symptoms.    Past Medical History:  Diagnosis Date  . Abnormal Pap smear   . Anxiety   . Back pain 03/07/2014  . Cervical spine disease    Mild  . Chest pain   . Elevated cholesterol   . Ganglion cyst of left foot 12/06/2013  . H/O bilateral breast implants 03/03/2013  . Hematuria 03/28/2014  . Hot flashes 12/06/2013  . Hypertension   . Menopausal symptoms 12/06/2013  . Mental disorder    anxiety  . PSVT (paroxysmal supraventricular tachycardia) ([place])    Documented by event recorder  . Rectocele 03/03/2013  . RUQ pain 03/07/2014  . Vaginal discharge 02/24/2014  . Vaginal dryness, menopausal 12/06/2013  . Vaginal Pap smear, abnormal   . Yeast infection 02/24/2014    Past Surgical History:  Procedure Laterality Date  . BREAST ENHANCEMENT SURGERY    . COLONOSCOPY N/A 06/25/2016   Procedure: COLONOSCOPY;  Surgeon: Rogene Houston, MD;  [facility];  Service: Endoscopy;  Laterality: N/A;  1:00-moved to [place] to notify pt  . POLYPECTOMY  06/25/2016   Procedure: POLYPECTOMY;  Surgeon: Rogene Houston, MD;  [facility];  Service: Endoscopy;;  colon   . toe biopsy      Current Medications: Outpatient Medications Prior to Visit  Medication Sig Dispense Refill  . acetaminophen (TYLENOL) 500 MG tablet Take 500-10,001 mg by  mouth every 6 (six) hours as needed (for pain).    Marland Kitchen ALPRAZolam (XANAX) 1 MG tablet TAKE ONE TABLET BY MOUTH TWICE DAILY AS NEEDED FOR ANXIETY. 45 tablet 1  . Multiple Vitamin (MULTIVITAMIN) tablet Take 1 tablet by mouth daily.      . sertraline (ZOLOFT) 25 MG tablet Take 25 mg by mouth daily. Reported on 10/18/2015     No facility-administered medications prior to visit.      Allergies:   Fish allergy; Bactrim ds [sulfamethoxazole-trimethoprim]; Ciprocinonide [fluocinolone]; Codeine; Penicillins; and Sulfa antibiotics   Social History   Socioeconomic History  . Marital status: Widowed    Spouse name: Not on file  . Number of children: 3  . Years of education: Not on file  . Highest education level: Not on file  Social Needs  . Financial resource strain: Not on file  . Food insecurity - worry: Not on file  . Food insecurity - inability: Not on file  . Transportation needs - medical: Not on file  . Transportation needs - non-medical: Not on file  Occupational History  . Occupation: Unemployed  Tobacco Use  . Smoking status: Never Smoker  . Smokeless tobacco: Never Used  Substance and Sexual Activity  . Alcohol use: No    Alcohol/week: 0.0 oz  . Drug use: No  . Sexual activity: Yes    Birth control/protection:  Post-menopausal  Other Topics Concern  . Not on file  Social History Narrative  . Not on file     Family History:  The patient's ***family history includes Cancer in her maternal grandmother and mother; Dementia in her father; Diabetes in her maternal grandfather; Healthy in her daughter, daughter, and daughter; Heart attack in her paternal grandmother; Heart disease in her maternal grandmother.   Review of Systems:   Please see the history of present illness.     General:  No chills, fever, night sweats or weight changes.  Cardiovascular:  No chest pain, dyspnea on exertion, edema, orthopnea, palpitations, paroxysmal nocturnal dyspnea. Dermatological: No rash,  lesions/masses Respiratory: No cough, dyspnea Urologic: No hematuria, dysuria Abdominal:   No nausea, vomiting, diarrhea, bright red blood per rectum, melena, or hematemesis Neurologic:  No visual changes, wkns, changes in mental status. All other systems reviewed and are otherwise negative except as noted above.   Physical Exam:    VS:  There were no vitals taken for this visit.   General: Well developed, well nourished,female appearing in no acute distress. Head: Normocephalic, atraumatic, sclera non-icteric, no xanthomas, nares are without discharge.  Neck: No carotid bruits. JVD not elevated.  Lungs: Respirations regular and unlabored, without wheezes or rales.  Heart: ***Regular rate and rhythm. No S3 or S4.  No murmur, no rubs, or gallops appreciated. Abdomen: Soft, non-tender, non-distended with normoactive bowel sounds. No hepatomegaly. No rebound/guarding. No obvious abdominal masses. Msk:  Strength and tone appear normal for age. No joint deformities or effusions. Extremities: No clubbing or cyanosis. No edema.  Distal pedal pulses are 2+ bilaterally. Neuro: Alert and oriented X 3. Moves all extremities spontaneously. No focal deficits noted. Psych:  Responds to questions appropriately with a normal affect. Skin: No rashes or lesions noted  Wt Readings from Last 3 Encounters:  03/30/17 127 lb (57.6 kg)  01/06/17 133 lb (60.3 kg)  12/09/16 130 lb 6.4 oz (59.1 kg)        Studies/Labs Reviewed:   EKG:  EKG is*** ordered today.  The ekg ordered today demonstrates ***  Recent Labs: 01/06/2017: ALT 18; BUN 14; Creatinine, Ser 0.61; Hemoglobin 14.4; Platelets 276; Potassium 4.1; Sodium 143; TSH 0.517   Lipid Panel    Component Value Date/Time   CHOL 203 (H) 01/06/2017 0933   TRIG 80 01/06/2017 0933   HDL 64 01/06/2017 0933   CHOLHDL 3.2 01/06/2017 0933   CHOLHDL 2.9 11/01/2013 1428   VLDL 13 11/01/2013 1428   LDLCALC 123 (H) 01/06/2017 0933    Additional studies/  records that were reviewed today include:  ***  Assessment:    No diagnosis found.   Plan:   In order of problems listed above:  1. Palpitations/ pSVT  2. HTN    Medication Adjustments/Labs and Tests Ordered: Current medicines are reviewed at length with the patient today.  Concerns regarding medicines are outlined above.  Medication changes, Labs and Tests ordered today are listed in the Patient Instructions below. There are no Patient Instructions on file for this visit.   Signed, Erma Heritage, PA-C  07/16/2017 12:33 PM    [address], [place], [place]  [address] Phone: [phone]; Fax: [phone]  [address], [place], [place] [address] Phone: [phone]

## 2017-07-20 ENCOUNTER — Ambulatory Visit: Payer: Self-pay | Admitting: Student

## 2017-07-20 ENCOUNTER — Encounter: Payer: Self-pay | Admitting: Student

## 2017-07-30 ENCOUNTER — Other Ambulatory Visit: Payer: Self-pay | Admitting: Adult Health

## 2017-08-06 ENCOUNTER — Encounter: Payer: Self-pay | Admitting: Cardiology

## 2017-08-06 ENCOUNTER — Ambulatory Visit (INDEPENDENT_AMBULATORY_CARE_PROVIDER_SITE_OTHER): Payer: Medicare Other | Admitting: Cardiology

## 2017-08-06 VITALS — BP 124/76 | HR 84 | Ht 66.0 in | Wt 124.0 lb

## 2017-08-06 DIAGNOSIS — I10 Essential (primary) hypertension: Secondary | ICD-10-CM | POA: Diagnosis not present

## 2017-08-06 DIAGNOSIS — I471 Supraventricular tachycardia: Secondary | ICD-10-CM

## 2017-08-06 NOTE — Progress Notes (Signed)
Clinical Summary April Knox is a 61 y.o.female seen today for follow up of the following medical problems.   1. PSVT - history is unclear from available notes. Notes mention PSVT documented on event recorder however do not see monitor report in our system EKGs reviewed, show normal sinus rhythm   - recent palpitations. Feeling of heart racing. 2 episodes in the last month. Episodes occurred at rest while driving. Lasted about 10 minutes. No other associated symptoms - no EtoH. Reports increased work stress. Coffee x 1 per day, caffeine free tea, no sodas. - pcp had recommended beta blocker but patient hesistant to start    2.HTN - compliant with meds Past Medical History:  Diagnosis Date  . Abnormal Pap smear   . Anxiety   . Back pain 03/07/2014  . Cervical spine disease    Mild  . Chest pain   . Elevated cholesterol   . Ganglion cyst of left foot 12/06/2013  . H/O bilateral breast implants 03/03/2013  . Hematuria 03/28/2014  . Hot flashes 12/06/2013  . Hypertension   . Menopausal symptoms 12/06/2013  . Mental disorder    anxiety  . PSVT (paroxysmal supraventricular tachycardia) ([place])    Documented by event recorder  . Rectocele 03/03/2013  . RUQ pain 03/07/2014  . Vaginal discharge 02/24/2014  . Vaginal dryness, menopausal 12/06/2013  . Vaginal Pap smear, abnormal   . Yeast infection 02/24/2014     Allergies  Allergen Reactions  . Fish Allergy Anaphylaxis and Swelling    Throat closes.  . Bactrim Ds [Sulfamethoxazole-Trimethoprim]     Hives   . Ciprocinonide [Fluocinolone] Hives    " made me feel awful"   . Codeine Other (See Comments)    DROPS PT. BLOOD PRESSURE  . Penicillins     Has patient had a PCN reaction causing immediate rash, facial/tongue/throat swelling, SOB or lightheadedness with hypotension:unsure Has patient had a PCN reaction causing severe rash involving mucus membranes or skin necrosis:unsure Has patient had a PCN reaction that required  hospitalization:No Has patient had a PCN reaction occurring within the last 10 years:No If all of the above answers are "NO", then may proceed with Cephalosporin use. Childhood reaction   . Sulfa Antibiotics Hives, Diarrhea, Nausea Only and Rash     Current Outpatient Medications  Medication Sig Dispense Refill  . acetaminophen (TYLENOL) 500 MG tablet Take 500-10,001 mg by mouth every 6 (six) hours as needed (for pain).    Marland Kitchen ALPRAZolam (XANAX) 1 MG tablet TAKE ONE TABLET BY MOUTH TWICE DAILY AS NEEDED FOR ANXIETY. 45 tablet 1  . Multiple Vitamin (MULTIVITAMIN) tablet Take 1 tablet by mouth daily.      . sertraline (ZOLOFT) 25 MG tablet Take 25 mg by mouth daily. Reported on 10/18/2015     No current facility-administered medications for this visit.      Past Surgical History:  Procedure Laterality Date  . BREAST ENHANCEMENT SURGERY    . COLONOSCOPY N/A 06/25/2016   Procedure: COLONOSCOPY;  Surgeon: Rogene Houston, MD;  [facility];  Service: Endoscopy;  Laterality: N/A;  1:00-moved to [place] to notify pt  . POLYPECTOMY  06/25/2016   Procedure: POLYPECTOMY;  Surgeon: Rogene Houston, MD;  [facility];  Service: Endoscopy;;  colon   . toe biopsy       Allergies  Allergen Reactions  . Fish Allergy Anaphylaxis and Swelling    Throat closes.  . Bactrim Ds [Sulfamethoxazole-Trimethoprim]  Hives   . Ciprocinonide [Fluocinolone] Hives    " made me feel awful"   . Codeine Other (See Comments)    DROPS PT. BLOOD PRESSURE  . Penicillins     Has patient had a PCN reaction causing immediate rash, facial/tongue/throat swelling, SOB or lightheadedness with hypotension:unsure Has patient had a PCN reaction causing severe rash involving mucus membranes or skin necrosis:unsure Has patient had a PCN reaction that required hospitalization:No Has patient had a PCN reaction occurring within the last 10 years:No If all of the above answers are "NO", then may  proceed with Cephalosporin use. Childhood reaction   . Sulfa Antibiotics Hives, Diarrhea, Nausea Only and Rash      Family History  Problem Relation Age of Onset  . Dementia Father   . Cancer Mother   . Healthy Daughter   . Healthy Daughter   . Healthy Daughter   . Cancer Maternal Grandmother   . Heart disease Maternal Grandmother   . Diabetes Maternal Grandfather   . Heart attack Paternal Grandmother      Social History April Knox reports that  has never smoked. she has never used smokeless tobacco. April Knox reports that she does not drink alcohol.   Review of Systems CONSTITUTIONAL: No weight loss, fever, chills, weakness or fatigue.  HEENT: Eyes: No visual loss, blurred vision, double vision or yellow sclerae.No hearing loss, sneezing, congestion, runny nose or sore throat.  SKIN: No rash or itching.  CARDIOVASCULAR: per hpi RESPIRATORY: No shortness of breath, cough or sputum.  GASTROINTESTINAL: No anorexia, nausea, vomiting or diarrhea. No abdominal pain or blood.  GENITOURINARY: No burning on urination, no polyuria NEUROLOGICAL: No headache, dizziness, syncope, paralysis, ataxia, numbness or tingling in the extremities. No change in bowel or bladder control.  MUSCULOSKELETAL: No muscle, back pain, joint pain or stiffness.  LYMPHATICS: No enlarged nodes. No history of splenectomy.  PSYCHIATRIC: +anxiety  ENDOCRINOLOGIC: No reports of sweating, cold or heat intolerance. No polyuria or polydipsia.  Marland Kitchen   Physical Examination Vitals:   08/06/17 0839  BP: 124/76  Pulse: 84  SpO2: 94%   Vitals:   08/06/17 0839  Weight: 124 lb (56.2 kg)  Height: 5\' 6"  (1.676 m)    Gen: resting comfortably, no acute distress HEENT: no scleral icterus, pupils equal round and reactive, no palptable cervical adenopathy,  CV: RRR, no m/r/g, no jvd Resp: Clear to auscultation bilaterally GI: abdomen is soft, non-tender, non-distended, normal bowel sounds, no  hepatosplenomegaly MSK: extremities are warm, no edema.  Skin: warm, no rash Neuro:  no focal deficits Psych: appropriate affect      Assessment and Plan   1. PSVT - long history of palpitations She has been resistant to medical therapy and remains so - encouraged to continue to limit caffeine and EtOH, follow symptoms. If significnat then reconsider either beta blocker or CCB.   2. HTN - at goal, continue current meds  3. Anxiety - severe anxiety managed by pcp      Arnoldo Lenis, M.D.

## 2017-08-06 NOTE — Patient Instructions (Signed)

## 2017-08-11 ENCOUNTER — Encounter: Payer: Self-pay | Admitting: Cardiology

## 2017-08-12 ENCOUNTER — Telehealth: Payer: Self-pay | Admitting: Adult Health

## 2017-08-12 NOTE — Telephone Encounter (Signed)
Left message I called 

## 2017-08-12 NOTE — Telephone Encounter (Signed)
Pt saw Dr Harl Bowie, doing OK,had EKG done and it was OK

## 2017-09-07 DIAGNOSIS — D492 Neoplasm of unspecified behavior of bone, soft tissue, and skin: Secondary | ICD-10-CM | POA: Diagnosis not present

## 2017-09-07 DIAGNOSIS — D485 Neoplasm of uncertain behavior of skin: Secondary | ICD-10-CM | POA: Diagnosis not present

## 2017-09-07 DIAGNOSIS — Z85828 Personal history of other malignant neoplasm of skin: Secondary | ICD-10-CM | POA: Diagnosis not present

## 2017-09-07 DIAGNOSIS — L570 Actinic keratosis: Secondary | ICD-10-CM | POA: Diagnosis not present

## 2017-09-07 DIAGNOSIS — D2261 Melanocytic nevi of right upper limb, including shoulder: Secondary | ICD-10-CM | POA: Diagnosis not present

## 2017-09-08 DIAGNOSIS — N390 Urinary tract infection, site not specified: Secondary | ICD-10-CM | POA: Diagnosis not present

## 2017-09-22 ENCOUNTER — Other Ambulatory Visit: Payer: Self-pay | Admitting: Adult Health

## 2017-09-24 DIAGNOSIS — L98499 Non-pressure chronic ulcer of skin of other sites with unspecified severity: Secondary | ICD-10-CM | POA: Diagnosis not present

## 2017-09-24 DIAGNOSIS — D485 Neoplasm of uncertain behavior of skin: Secondary | ICD-10-CM | POA: Diagnosis not present

## 2017-10-06 ENCOUNTER — Telehealth: Payer: Self-pay | Admitting: *Deleted

## 2017-10-06 NOTE — Telephone Encounter (Signed)
Left message I returned her call  

## 2017-10-06 NOTE — Telephone Encounter (Signed)
She had questions answered.

## 2017-10-08 DIAGNOSIS — Z4802 Encounter for removal of sutures: Secondary | ICD-10-CM | POA: Diagnosis not present

## 2017-10-10 DIAGNOSIS — R102 Pelvic and perineal pain: Secondary | ICD-10-CM | POA: Diagnosis not present

## 2017-10-10 DIAGNOSIS — Z79899 Other long term (current) drug therapy: Secondary | ICD-10-CM | POA: Diagnosis not present

## 2017-10-10 DIAGNOSIS — L02416 Cutaneous abscess of left lower limb: Secondary | ICD-10-CM | POA: Diagnosis not present

## 2017-10-12 DIAGNOSIS — N764 Abscess of vulva: Secondary | ICD-10-CM | POA: Diagnosis not present

## 2017-10-12 DIAGNOSIS — Z9882 Breast implant status: Secondary | ICD-10-CM | POA: Diagnosis not present

## 2017-10-12 DIAGNOSIS — L0100 Impetigo, unspecified: Secondary | ICD-10-CM | POA: Diagnosis not present

## 2017-10-12 DIAGNOSIS — M7989 Other specified soft tissue disorders: Secondary | ICD-10-CM | POA: Diagnosis not present

## 2017-10-12 DIAGNOSIS — Z1231 Encounter for screening mammogram for malignant neoplasm of breast: Secondary | ICD-10-CM | POA: Diagnosis not present

## 2017-10-13 ENCOUNTER — Telehealth: Payer: Self-pay | Admitting: *Deleted

## 2017-10-13 DIAGNOSIS — K2900 Acute gastritis without bleeding: Secondary | ICD-10-CM | POA: Diagnosis not present

## 2017-10-13 NOTE — Telephone Encounter (Signed)
Pt said she had spot in groin that was sore and she  felt knot, so she went to ER in [place] and was told had abscess, she was given doxycycline and then felt worse, Sunday and then she saw Dr Tarri Glenn Monday morning and had I&D, and was ?cyst. Has pain under right ribs,if persists go to ER.  To see Dr Tarri Glenn in am, had packing removed today.

## 2017-10-14 DIAGNOSIS — D280 Benign neoplasm of vulva: Secondary | ICD-10-CM | POA: Diagnosis not present

## 2017-10-20 DIAGNOSIS — L0100 Impetigo, unspecified: Secondary | ICD-10-CM | POA: Diagnosis not present

## 2017-10-20 DIAGNOSIS — D280 Benign neoplasm of vulva: Secondary | ICD-10-CM | POA: Diagnosis not present

## 2017-10-23 ENCOUNTER — Ambulatory Visit: Payer: Medicare Other | Admitting: Obstetrics & Gynecology

## 2017-10-29 ENCOUNTER — Ambulatory Visit: Payer: Medicare Other | Admitting: Adult Health

## 2017-11-11 ENCOUNTER — Encounter: Payer: Self-pay | Admitting: Adult Health

## 2017-11-11 ENCOUNTER — Encounter (INDEPENDENT_AMBULATORY_CARE_PROVIDER_SITE_OTHER): Payer: Self-pay

## 2017-11-11 ENCOUNTER — Ambulatory Visit (INDEPENDENT_AMBULATORY_CARE_PROVIDER_SITE_OTHER): Payer: Medicare Other | Admitting: Adult Health

## 2017-11-11 VITALS — BP 136/80 | HR 76 | Ht 66.0 in | Wt 127.0 lb

## 2017-11-11 DIAGNOSIS — L929 Granulomatous disorder of the skin and subcutaneous tissue, unspecified: Secondary | ICD-10-CM | POA: Diagnosis not present

## 2017-11-11 DIAGNOSIS — R5383 Other fatigue: Secondary | ICD-10-CM

## 2017-11-11 DIAGNOSIS — R7309 Other abnormal glucose: Secondary | ICD-10-CM

## 2017-11-11 DIAGNOSIS — E7800 Pure hypercholesterolemia, unspecified: Secondary | ICD-10-CM | POA: Diagnosis not present

## 2017-11-11 NOTE — Progress Notes (Signed)
Subjective:     [name], female   DOB: [birth year], 62 y.o.   MRN: [id number]  HPI  April Knox is a 63 year old white female, widowed, in complaining of knot in groin area, had I&D of ?abscess in ER in [place] then saw Dr Tarri Glenn and had excision. And she requests fasting labs.   Review of Systems Knot in groin area Reviewed past medical,surgical, social and family history. Reviewed medications and allergies.     Objective:   Physical Exam BP 136/80 (BP Location: Left Arm, Patient Position: Sitting, Cuff Size: Normal)   Pulse 76   Ht 5\' 6"  (1.676 m)   Wt 127 lb (57.6 kg)   BMI 20.50 kg/m    Skin warm and dry, has area in left groin where had ?abscess, then had excision of that showed fat necrosis 10/12/17 by Dr Tarri Glenn, and there is just scar tissue now, no redness or tenderness. Try to wear HI cut panties so it will not rub, but area healing well, finish antibiotics.   Assessment:     1. Granulation tissue of skin   2. Elevated hemoglobin A1c   3. Elevated cholesterol   4. Other fatigue       Plan:     Check CBC,CMP,TSH and lipids.A1c Finish doxycycline F/U prn

## 2017-11-12 ENCOUNTER — Telehealth: Payer: Self-pay | Admitting: Adult Health

## 2017-11-12 LAB — COMPREHENSIVE METABOLIC PANEL
ALBUMIN: 4.6 g/dL (ref 3.6–4.8)
ALK PHOS: 76 IU/L (ref 39–117)
ALT: 13 IU/L (ref 0–32)
AST: 16 IU/L (ref 0–40)
Albumin/Globulin Ratio: 1.8 (ref 1.2–2.2)
BUN/Creatinine Ratio: 30 — ABNORMAL HIGH (ref 12–28)
BUN: 20 mg/dL (ref 8–27)
Bilirubin Total: 0.5 mg/dL (ref 0.0–1.2)
CO2: 28 mmol/L (ref 20–29)
CREATININE: 0.67 mg/dL (ref 0.57–1.00)
Calcium: 10 mg/dL (ref 8.7–10.3)
Chloride: 98 mmol/L (ref 96–106)
GFR calc non Af Amer: 95 mL/min/{1.73_m2} (ref >59)
GFR, EST AFRICAN AMERICAN: 109 mL/min/{1.73_m2} (ref >59)
GLUCOSE: 107 mg/dL — AB (ref 65–99)
Globulin, Total: 2.6 g/dL (ref 1.5–4.5)
Potassium: 4.8 mmol/L (ref 3.5–5.2)
Sodium: 140 mmol/L (ref 134–144)
TOTAL PROTEIN: 7.2 g/dL (ref 6.0–8.5)

## 2017-11-12 LAB — CBC
HEMOGLOBIN: 15 g/dL (ref 11.1–15.9)
Hematocrit: 43.4 % (ref 34.0–46.6)
MCH: 32.5 pg (ref 26.6–33.0)
MCHC: 34.6 g/dL (ref 31.5–35.7)
MCV: 94 fL (ref 79–97)
PLATELETS: 277 10*3/uL (ref 150–379)
RBC: 4.62 x10E6/uL (ref 3.77–5.28)
RDW: 13.3 % (ref 12.3–15.4)
WBC: 4.9 10*3/uL (ref 3.4–10.8)

## 2017-11-12 LAB — HEMOGLOBIN A1C
Est. average glucose Bld gHb Est-mCnc: 128 mg/dL
HEMOGLOBIN A1C: 6.1 % — AB (ref 4.8–5.6)

## 2017-11-12 LAB — LIPID PANEL
Chol/HDL Ratio: 2.9 ratio (ref 0.0–4.4)
Cholesterol, Total: 200 mg/dL — ABNORMAL HIGH (ref 100–199)
HDL: 69 mg/dL (ref >39)
LDL Calculated: 122 mg/dL — ABNORMAL HIGH (ref 0–99)
TRIGLYCERIDES: 45 mg/dL (ref 0–149)
VLDL CHOLESTEROL CAL: 9 mg/dL (ref 5–40)

## 2017-11-12 LAB — TSH: TSH: 0.833 u[IU]/mL (ref 0.450–4.500)

## 2017-11-12 NOTE — Telephone Encounter (Signed)
Left message that labs looked good, except A1c 6.1,in pre diabetic phase, so cut carbs and stay active,

## 2017-11-12 NOTE — Telephone Encounter (Signed)
Pt aware of labs  

## 2017-11-16 DIAGNOSIS — D280 Benign neoplasm of vulva: Secondary | ICD-10-CM | POA: Diagnosis not present

## 2017-11-16 DIAGNOSIS — L910 Hypertrophic scar: Secondary | ICD-10-CM | POA: Diagnosis not present

## 2017-11-20 ENCOUNTER — Other Ambulatory Visit: Payer: Self-pay | Admitting: Adult Health

## 2017-12-09 DIAGNOSIS — T8549XA Other mechanical complication of breast prosthesis and implant, initial encounter: Secondary | ICD-10-CM | POA: Diagnosis not present

## 2017-12-21 ENCOUNTER — Other Ambulatory Visit: Payer: Self-pay | Admitting: Adult Health

## 2017-12-23 ENCOUNTER — Telehealth: Payer: Self-pay | Admitting: Adult Health

## 2017-12-23 MED ORDER — TERCONAZOLE 0.4 % VA CREA: 1.0000 | TOPICAL_CREAM | Freq: Every day | VAGINAL | 0 refills | Status: DC

## 2017-12-23 NOTE — Telephone Encounter (Signed)
Patient called stating that she has an irritated bottom and would like for [name] to call her something in to her pharmacy [facility]. Please contact pt

## 2017-12-23 NOTE — Telephone Encounter (Signed)
Pt called stating that her bottom is irritated. When asked pt to explain further she states that she is having some itching. No c/o redness or discharge. Pt would like medication sent in for this. Advised that I would send her request to [facility] and she could check with her pharmacy later today. Pt verbalized understanding.

## 2017-12-23 NOTE — Telephone Encounter (Signed)
Pt complains of vaginal irritation will rx terazol cream

## 2018-01-06 DIAGNOSIS — R5383 Other fatigue: Secondary | ICD-10-CM | POA: Diagnosis not present

## 2018-01-06 DIAGNOSIS — R7303 Prediabetes: Secondary | ICD-10-CM | POA: Diagnosis not present

## 2018-01-06 DIAGNOSIS — K2900 Acute gastritis without bleeding: Secondary | ICD-10-CM | POA: Diagnosis not present

## 2018-01-06 DIAGNOSIS — J301 Allergic rhinitis due to pollen: Secondary | ICD-10-CM | POA: Diagnosis not present

## 2018-01-06 DIAGNOSIS — I10 Essential (primary) hypertension: Secondary | ICD-10-CM | POA: Diagnosis not present

## 2018-01-19 DIAGNOSIS — G43B0 Ophthalmoplegic migraine, not intractable: Secondary | ICD-10-CM | POA: Diagnosis not present

## 2018-01-26 DIAGNOSIS — H35033 Hypertensive retinopathy, bilateral: Secondary | ICD-10-CM | POA: Diagnosis not present

## 2018-02-04 ENCOUNTER — Telehealth: Payer: Self-pay | Admitting: *Deleted

## 2018-02-04 MED ORDER — ALPRAZOLAM 1 MG PO TABS: 1.0000 mg | ORAL_TABLET | Freq: Two times a day (BID) | ORAL | 1 refills | Status: DC | PRN

## 2018-02-04 NOTE — Addendum Note (Signed)
Addended by: Derrek Monaco A on: 02/04/2018 02:41 PM   Modules accepted: Orders

## 2018-02-04 NOTE — Telephone Encounter (Signed)
Pt has had ocular migraine and placed on Topamax and Imitrex,she saw Dr Radford Pax for eye exam, has had slight discharge at times, she is feeling more stressed, and wants to get 60 instead of 45 xanax, sometimes needs to take 1 bid..will increase to #60 but if does not needs do not take

## 2018-02-04 NOTE — Telephone Encounter (Signed)
Patient states she needs to speak with JAG, not willing to discuss what she needs. Advised she was seeing patient's but will send message.

## 2018-02-09 ENCOUNTER — Ambulatory Visit (INDEPENDENT_AMBULATORY_CARE_PROVIDER_SITE_OTHER): Payer: Medicare Other | Admitting: Cardiology

## 2018-02-09 ENCOUNTER — Encounter: Payer: Self-pay | Admitting: Cardiology

## 2018-02-09 ENCOUNTER — Encounter: Payer: Self-pay | Admitting: *Deleted

## 2018-02-09 VITALS — BP 122/68 | HR 79 | Ht 66.0 in | Wt 125.0 lb

## 2018-02-09 DIAGNOSIS — I471 Supraventricular tachycardia: Secondary | ICD-10-CM | POA: Diagnosis not present

## 2018-02-09 DIAGNOSIS — I10 Essential (primary) hypertension: Secondary | ICD-10-CM

## 2018-02-09 NOTE — Progress Notes (Signed)
Clinical Summary April Knox is a 62 y.o.female seen today for follow up of the following medical problems.   1. PSVT - history is unclear from available notes. Notes mention PSVT documented on event recorder however do not see monitor report in our system EKGs reviewed, show normal sinus rhythm   - no recent palpitaitons.     2.HTN - she is compliant with meds  3. Prediabetes - last HgbA1c 6.1  - followd by pcp    SH: works part time at [employer]  Past Medical History:  Diagnosis Date  . Abnormal Pap smear   . Anxiety   . Back pain 03/07/2014  . Cervical spine disease    Mild  . Chest pain   . Elevated cholesterol   . Ganglion cyst of left foot 12/06/2013  . H/O bilateral breast implants 03/03/2013  . Hematuria 03/28/2014  . Hot flashes 12/06/2013  . Hypertension   . Menopausal symptoms 12/06/2013  . Mental disorder    anxiety  . PSVT (paroxysmal supraventricular tachycardia) ([place])    Documented by event recorder  . Rectocele 03/03/2013  . RUQ pain 03/07/2014  . Vaginal discharge 02/24/2014  . Vaginal dryness, menopausal 12/06/2013  . Vaginal Pap smear, abnormal   . Yeast infection 02/24/2014     Allergies  Allergen Reactions  . Fish Allergy Anaphylaxis and Swelling    Throat closes.  . Bactrim Ds [Sulfamethoxazole-Trimethoprim]     Hives   . Ciprocinonide [Fluocinolone] Hives    " made me feel awful"   . Codeine Other (See Comments)    DROPS PT. BLOOD PRESSURE  . Penicillins     Has patient had a PCN reaction causing immediate rash, facial/tongue/throat swelling, SOB or lightheadedness with hypotension:unsure Has patient had a PCN reaction causing severe rash involving mucus membranes or skin necrosis:unsure Has patient had a PCN reaction that required hospitalization:No Has patient had a PCN reaction occurring within the last 10 years:No If all of the above answers are "NO", then may proceed with Cephalosporin use. Childhood reaction   . Sulfa  Antibiotics Hives, Diarrhea, Nausea Only and Rash     Current Outpatient Medications  Medication Sig Dispense Refill  . acetaminophen (TYLENOL) 500 MG tablet Take 500-10,001 mg by mouth every 6 (six) hours as needed (for pain).    Marland Kitchen ALPRAZolam (XANAX) 1 MG tablet Take 1 tablet (1 mg total) by mouth 2 (two) times daily as needed. for anxiety 60 tablet 1  . doxycycline (VIBRAMYCIN) 100 MG capsule Take 100 mg by mouth 2 (two) times daily.    . hydrochlorothiazide (HYDRODIURIL) 25 MG tablet Take 25 mg by mouth daily.    . Multiple Vitamin (MULTIVITAMIN) tablet Take 1 tablet by mouth daily.      Marland Kitchen terconazole (TERAZOL 7) 0.4 % vaginal cream Place 1 applicator vaginally at bedtime. 45 g 0   No current facility-administered medications for this visit.      Past Surgical History:  Procedure Laterality Date  . BREAST ENHANCEMENT SURGERY    . COLONOSCOPY N/A 06/25/2016   Procedure: COLONOSCOPY;  Surgeon: Rogene Houston, MD;  [facility];  Service: Endoscopy;  Laterality: N/A;  1:00-moved to [place] to notify pt  . POLYPECTOMY  06/25/2016   Procedure: POLYPECTOMY;  Surgeon: Rogene Houston, MD;  [facility];  Service: Endoscopy;;  colon   . toe biopsy       Allergies  Allergen Reactions  . Fish Allergy Anaphylaxis  and Swelling    Throat closes.  . Bactrim Ds [Sulfamethoxazole-Trimethoprim]     Hives   . Ciprocinonide [Fluocinolone] Hives    " made me feel awful"   . Codeine Other (See Comments)    DROPS PT. BLOOD PRESSURE  . Penicillins     Has patient had a PCN reaction causing immediate rash, facial/tongue/throat swelling, SOB or lightheadedness with hypotension:unsure Has patient had a PCN reaction causing severe rash involving mucus membranes or skin necrosis:unsure Has patient had a PCN reaction that required hospitalization:No Has patient had a PCN reaction occurring within the last 10 years:No If all of the above answers are "NO", then may proceed  with Cephalosporin use. Childhood reaction   . Sulfa Antibiotics Hives, Diarrhea, Nausea Only and Rash      Family History  Problem Relation Age of Onset  . Dementia Father   . Cancer Mother   . Healthy Daughter   . Healthy Daughter   . Healthy Daughter   . Cancer Maternal Grandmother   . Heart disease Maternal Grandmother   . Diabetes Maternal Grandfather   . Heart attack Paternal Grandmother      Social History April Knox reports that she has never smoked. She has never used smokeless tobacco. April Knox reports that she does not drink alcohol.   Review of Systems CONSTITUTIONAL: No weight loss, fever, chills, weakness or fatigue.  HEENT: Eyes: No visual loss, blurred vision, double vision or yellow sclerae.No hearing loss, sneezing, congestion, runny nose or sore throat.  SKIN: No rash or itching.  CARDIOVASCULAR: per hpi RESPIRATORY: No shortness of breath, cough or sputum.  GASTROINTESTINAL: No anorexia, nausea, vomiting or diarrhea. No abdominal pain or blood.  GENITOURINARY: No burning on urination, no polyuria NEUROLOGICAL: No headache, dizziness, syncope, paralysis, ataxia, numbness or tingling in the extremities. No change in bowel or bladder control.  MUSCULOSKELETAL: No muscle, back pain, joint pain or stiffness.  LYMPHATICS: No enlarged nodes. No history of splenectomy.  PSYCHIATRIC: No history of depression or anxiety.  ENDOCRINOLOGIC: No reports of sweating, cold or heat intolerance. No polyuria or polydipsia.  Marland Kitchen   Physical Examination Vitals:   02/09/18 0833  BP: 122/68  Pulse: 79  SpO2: 98%   Vitals:   02/09/18 0833  Weight: 125 lb (56.7 kg)  Height: 5\' 6"  (1.676 m)    Gen: resting comfortably, no acute distress HEENT: no scleral icterus, pupils equal round and reactive, no palptable cervical adenopathy,  CV: RRR, no m/r/g, no jvd Resp: Clear to auscultation bilaterally GI: abdomen is soft, non-tender, non-distended, normal bowel sounds,  no hepatosplenomegaly MSK: extremities are warm, no edema.  Skin: warm, no rash Neuro:  no focal deficits Psych: appropriate affect     Assessment and Plan   1. PSVT - long history of palpitations She has been resistant to medical therapy and remains so - we will continue to monitor at this time  2. HTN - she is at goal, continue current meds    F/u as needed    Arnoldo Lenis, M.D.

## 2018-02-09 NOTE — Patient Instructions (Signed)
Your physician recommends that you schedule a follow-up appointment in: AS NEEDED WITH DR BRANCH  Your physician recommends that you continue on your current medications as directed. Please refer to the Current Medication list given to you today.  Thank you for choosing [facility]!!    

## 2018-02-15 ENCOUNTER — Encounter: Payer: Self-pay | Admitting: Cardiology

## 2018-02-18 ENCOUNTER — Telehealth: Payer: Self-pay | Admitting: Adult Health

## 2018-02-18 MED ORDER — TERCONAZOLE 0.4 % VA CREA: 1.0000 | TOPICAL_CREAM | Freq: Every day | VAGINAL | 0 refills | Status: DC

## 2018-02-18 NOTE — Telephone Encounter (Signed)
April Knox is feeling irritated and request refill on terazol, has appt next week

## 2018-02-18 NOTE — Telephone Encounter (Signed)
Patient called stating that she would like a call back from [place], patient states that she is having issues with her bottom and [name] had prescribed her a cream but she has run out. Please contact pt

## 2018-02-25 ENCOUNTER — Ambulatory Visit (INDEPENDENT_AMBULATORY_CARE_PROVIDER_SITE_OTHER): Payer: Medicare Other | Admitting: Adult Health

## 2018-02-25 ENCOUNTER — Encounter: Payer: Self-pay | Admitting: Adult Health

## 2018-02-25 VITALS — BP 134/79 | HR 81 | Ht 66.0 in | Wt 124.5 lb

## 2018-02-25 DIAGNOSIS — N952 Postmenopausal atrophic vaginitis: Secondary | ICD-10-CM | POA: Diagnosis not present

## 2018-02-25 DIAGNOSIS — N898 Other specified noninflammatory disorders of vagina: Secondary | ICD-10-CM | POA: Diagnosis not present

## 2018-02-25 MED ORDER — ESTROGENS, CONJUGATED 0.625 MG/GM VA CREA: TOPICAL_CREAM | VAGINAL | 0 refills | Status: DC

## 2018-02-25 NOTE — Progress Notes (Signed)
  Subjective:     [name], female   DOB: 1956/07/15, 62 y.o.   MRN: [id number]  HPI April Knox is a 62 year old white female in complaining of vaginal irritation, has had sex recently, feels better after Terazol but not 100%  Review of Systems Vaginal irritation, better after using Terazol Reviewed past medical,surgical, social and family history. Reviewed medications and allergies.     Objective:   Physical Exam BP 134/79 (BP Location: Left Arm, Patient Position: Sitting, Cuff Size: Normal)   Pulse 81   Ht 5\' 6"  (1.676 m)   Wt 124 lb 8 oz (56.5 kg)   BMI 20.09 kg/m  Skin warm and dry,tan.Pelvic: external genitalia is normal in appearance no lesions, vagina: is pale with loss of color, moisture and rugae,few strawberry like  spots,urethra has no lesions or masses noted, cervix:smooth. uterus: normal size, shape and contour, non tender, no masses felt, adnexa: no masses or tenderness noted. Bladder is non tender and no masses felt.  Will try PVC.    Assessment:     1. Vaginal irritation   2. Vaginal atrophy       Plan:     Meds ordered this encounter  Medications  . conjugated estrogens (PREMARIN) vaginal cream    Sig: Use 0.5 gm in vagina at hs for 2 weeks then 2 x weekly    Dispense:  24 g    Refill:  0    Order Specific Question:   Supervising Provider    Answer:   Tania Ade H [[id number]]  F/U prn

## 2018-03-02 DIAGNOSIS — L818 Other specified disorders of pigmentation: Secondary | ICD-10-CM | POA: Diagnosis not present

## 2018-03-02 DIAGNOSIS — L570 Actinic keratosis: Secondary | ICD-10-CM | POA: Diagnosis not present

## 2018-03-02 DIAGNOSIS — M1388 Other specified arthritis, other site: Secondary | ICD-10-CM | POA: Diagnosis not present

## 2018-03-02 DIAGNOSIS — R5383 Other fatigue: Secondary | ICD-10-CM | POA: Diagnosis not present

## 2018-03-02 DIAGNOSIS — I10 Essential (primary) hypertension: Secondary | ICD-10-CM | POA: Diagnosis not present

## 2018-03-02 DIAGNOSIS — J302 Other seasonal allergic rhinitis: Secondary | ICD-10-CM | POA: Diagnosis not present

## 2018-03-02 DIAGNOSIS — Z85828 Personal history of other malignant neoplasm of skin: Secondary | ICD-10-CM | POA: Diagnosis not present

## 2018-03-22 DIAGNOSIS — L02214 Cutaneous abscess of groin: Secondary | ICD-10-CM | POA: Diagnosis not present

## 2018-03-22 DIAGNOSIS — L0100 Impetigo, unspecified: Secondary | ICD-10-CM | POA: Diagnosis not present

## 2018-03-23 ENCOUNTER — Telehealth: Payer: Self-pay | Admitting: Adult Health

## 2018-03-23 DIAGNOSIS — L0390 Cellulitis, unspecified: Secondary | ICD-10-CM | POA: Diagnosis not present

## 2018-03-23 NOTE — Telephone Encounter (Signed)
PT says that Friday had sore spot on left leg in bend of leg and saw PCP and had I&D of boil, and was given doxycyline.Has had chills, but still working, after shower leg was red and hurts, and went and saw Dr again today, has appt tomorrow in F/U was given rx for clindamycin, today.

## 2018-03-24 DIAGNOSIS — L03119 Cellulitis of unspecified part of limb: Secondary | ICD-10-CM | POA: Diagnosis not present

## 2018-03-25 DIAGNOSIS — L03039 Cellulitis of unspecified toe: Secondary | ICD-10-CM | POA: Diagnosis not present

## 2018-04-01 ENCOUNTER — Encounter: Payer: Self-pay | Admitting: Adult Health

## 2018-04-01 ENCOUNTER — Other Ambulatory Visit (HOSPITAL_COMMUNITY)
Admission: RE | Admit: 2018-04-01 | Discharge: 2018-04-01 | Disposition: A | Discharge status: Home / Self Care | Payer: Medicare Other | Source: Ambulatory Visit | Attending: Adult Health | Admitting: Adult Health

## 2018-04-01 ENCOUNTER — Ambulatory Visit (INDEPENDENT_AMBULATORY_CARE_PROVIDER_SITE_OTHER): Payer: Medicare Other | Admitting: Adult Health

## 2018-04-01 VITALS — BP 140/70 | HR 77 | Ht 67.0 in | Wt 124.0 lb

## 2018-04-01 DIAGNOSIS — Z1151 Encounter for screening for human papillomavirus (HPV): Secondary | ICD-10-CM | POA: Insufficient documentation

## 2018-04-01 DIAGNOSIS — Z01411 Encounter for gynecological examination (general) (routine) with abnormal findings: Secondary | ICD-10-CM | POA: Diagnosis not present

## 2018-04-01 DIAGNOSIS — N952 Postmenopausal atrophic vaginitis: Secondary | ICD-10-CM | POA: Insufficient documentation

## 2018-04-01 DIAGNOSIS — R87612 Low grade squamous intraepithelial lesion on cytologic smear of cervix (LGSIL): Secondary | ICD-10-CM | POA: Insufficient documentation

## 2018-04-01 DIAGNOSIS — F419 Anxiety disorder, unspecified: Secondary | ICD-10-CM

## 2018-04-01 DIAGNOSIS — Z01419 Encounter for gynecological examination (general) (routine) without abnormal findings: Secondary | ICD-10-CM | POA: Insufficient documentation

## 2018-04-01 DIAGNOSIS — R7309 Other abnormal glucose: Secondary | ICD-10-CM | POA: Insufficient documentation

## 2018-04-01 DIAGNOSIS — Z1212 Encounter for screening for malignant neoplasm of rectum: Secondary | ICD-10-CM | POA: Diagnosis not present

## 2018-04-01 DIAGNOSIS — Z1211 Encounter for screening for malignant neoplasm of colon: Secondary | ICD-10-CM | POA: Insufficient documentation

## 2018-04-01 LAB — HEMOCCULT GUIAC POC 1CARD (OFFICE): FECAL OCCULT BLD: NEGATIVE

## 2018-04-01 MED ORDER — ESTROGENS, CONJUGATED 0.625 MG/GM VA CREA
TOPICAL_CREAM | VAGINAL | 1 refills | Status: DC
Start: 2018-04-01 — End: 2018-11-03

## 2018-04-01 NOTE — Progress Notes (Addendum)
[name], female   DOB: October 17, 1955, 62 y.o.   MRN: [id number] History of Present Illness: April Knox is a 62 year old white female, widowed, in for a well woman gyn exam and pap. She works 2 jobs.  PCP is Dr. Sherrie Sport.    Current Medications, Allergies, Past Medical History, Past Surgical History, Family History and Social History were reviewed in [employer] record.     Review of Systems: Patient denies any headaches, hearing loss, fatigue, blurred vision, shortness of breath, chest pain, abdominal pain, problems with bowel movements, urination, or intercourse. No joint pain or mood swings.Hips ache at times. Recent abscess left groin    Physical Exam:BP 140/70 (BP Location: Left Arm, Cuff Size: Normal)   Pulse 77   Ht 5\' 7"  (1.702 m)   Wt 124 lb (56.2 kg)   BMI 19.42 kg/m  General:  Well developed, well nourished, no acute distress Skin:  Warm and dry Neck:  Midline trachea, normal thyroid, good ROM, no lymphadenopathy,no cartoid bruits heard  Lungs; Clear to auscultation bilaterally Breast:  No dominant palpable mass, retraction, or nipple discharge,hs bilateral implants Cardiovascular: Regular rate and rhythm Abdomen:  Soft, non tender, no hepatosplenomegaly Pelvic:  External genitalia is normal in appearance,healing abscess left groin  The vagina is not as pale and has more moisture. Urethra has no lesions or masses. The cervix is smooth, pap with HPV and GC/CHL performed.  Uterus is felt to be normal size, shape, and contour.  No adnexal masses or tenderness noted.Bladder is non tender, no masses felt. Rectal: Good sphincter tone, no polyps, or hemorrhoids felt.  Hemoccult negative. Extremities/musculoskeletal:  No swelling or varicosities noted, no clubbing or cyanosis Psych:  No mood changes, alert and cooperative,seems happy PHQ 9 score 0. Continue PVC 2 x weekly.  Impression: 1. Encounter for gynecological examination with  Papanicolaou smear of cervix   2. Screening for colorectal cancer   3. Anxiety   4. Elevated hemoglobin A1c   5. Vaginal atrophy       Plan: Check CMP and A1c Physical in 2 year Mammogram every 1-2 years, check and see when had last at [facility] 10/2017, get get copy sent to office Colonoscopy per GI Meds ordered this encounter  Medications  . conjugated estrogens (PREMARIN) vaginal cream    Sig: Use 0.5 gm in vagina at bedtime 2 x weekly    Dispense:  42.5 g    Refill:  1    Order Specific Question:   Supervising Provider    Answer:   Tania Ade H [[id number]]

## 2018-04-01 NOTE — Addendum Note (Signed)
Addended by: Diona Fanti A on: 04/01/2018 10:34 AM   Modules accepted: Orders

## 2018-04-02 LAB — COMPREHENSIVE METABOLIC PANEL
ALK PHOS: 68 IU/L (ref 39–117)
ALT: 14 IU/L (ref 0–32)
AST: 18 IU/L (ref 0–40)
Albumin/Globulin Ratio: 1.8 (ref 1.2–2.2)
Albumin: 4.7 g/dL (ref 3.6–4.8)
BILIRUBIN TOTAL: 0.6 mg/dL (ref 0.0–1.2)
BUN / CREAT RATIO: 25 (ref 12–28)
BUN: 16 mg/dL (ref 8–27)
CHLORIDE: 96 mmol/L (ref 96–106)
CO2: 27 mmol/L (ref 20–29)
CREATININE: 0.64 mg/dL (ref 0.57–1.00)
Calcium: 9.9 mg/dL (ref 8.7–10.3)
GFR calc Af Amer: 111 mL/min/{1.73_m2} (ref >59)
GFR calc non Af Amer: 96 mL/min/{1.73_m2} (ref >59)
GLOBULIN, TOTAL: 2.6 g/dL (ref 1.5–4.5)
Glucose: 105 mg/dL — ABNORMAL HIGH (ref 65–99)
Potassium: 3.1 mmol/L — ABNORMAL LOW (ref 3.5–5.2)
Sodium: 140 mmol/L (ref 134–144)
Total Protein: 7.3 g/dL (ref 6.0–8.5)

## 2018-04-02 LAB — HEMOGLOBIN A1C
ESTIMATED AVERAGE GLUCOSE: 126 mg/dL
HEMOGLOBIN A1C: 6 % — AB (ref 4.8–5.6)

## 2018-04-05 ENCOUNTER — Telehealth: Payer: Self-pay | Admitting: Adult Health

## 2018-04-05 LAB — CYTOLOGY - PAP
CHLAMYDIA, DNA PROBE: NEGATIVE
HPV (WINDOPATH): DETECTED — AB
NEISSERIA GONORRHEA: NEGATIVE

## 2018-04-05 NOTE — Telephone Encounter (Signed)
Pt aware of labs and recommendations

## 2018-04-05 NOTE — Telephone Encounter (Signed)
Left message about labs and that potassium was low, at 3.1 eat bananas, OJ and Gatorade to get that make up.A1c 6.0 so stable, and blood sugar slightly elevated,watch carbs

## 2018-04-06 ENCOUNTER — Encounter: Payer: Self-pay | Admitting: Adult Health

## 2018-04-06 ENCOUNTER — Telehealth: Payer: Self-pay | Admitting: Adult Health

## 2018-04-06 ENCOUNTER — Telehealth: Payer: Self-pay | Admitting: *Deleted

## 2018-04-06 DIAGNOSIS — R8789 Other abnormal findings in specimens from female genital organs: Secondary | ICD-10-CM | POA: Insufficient documentation

## 2018-04-06 DIAGNOSIS — R87618 Other abnormal cytological findings on specimens from cervix uteri: Secondary | ICD-10-CM | POA: Insufficient documentation

## 2018-04-06 HISTORY — DX: Other abnormal cytological findings on specimens from cervix uteri: R87.618

## 2018-04-06 NOTE — Telephone Encounter (Signed)
Pt aware that pap +HPV, LSIL, appt made for colpo, with Dr Elonda Husky

## 2018-04-06 NOTE — Telephone Encounter (Signed)
Spoke with pt letting her know her insurance is not wanting to cover Premarin vaginal cream. I spoke with JAG and she advised can give samples. 3 sample boxes given. Lot # [id number] exp 7/20. Pt to pick up at front desk. [place]

## 2018-04-08 ENCOUNTER — Encounter: Payer: Self-pay | Admitting: Obstetrics and Gynecology

## 2018-04-08 ENCOUNTER — Telehealth: Payer: Self-pay | Admitting: *Deleted

## 2018-04-08 ENCOUNTER — Other Ambulatory Visit: Payer: Self-pay | Admitting: Obstetrics and Gynecology

## 2018-04-08 ENCOUNTER — Ambulatory Visit (INDEPENDENT_AMBULATORY_CARE_PROVIDER_SITE_OTHER): Payer: Medicare Other | Admitting: Obstetrics and Gynecology

## 2018-04-08 VITALS — BP 154/78 | HR 74 | Ht 66.0 in | Wt 123.4 lb

## 2018-04-08 DIAGNOSIS — R87612 Low grade squamous intraepithelial lesion on cytologic smear of cervix (LGSIL): Secondary | ICD-10-CM

## 2018-04-08 DIAGNOSIS — L720 Epidermal cyst: Secondary | ICD-10-CM | POA: Diagnosis not present

## 2018-04-08 DIAGNOSIS — L03119 Cellulitis of unspecified part of limb: Secondary | ICD-10-CM | POA: Diagnosis not present

## 2018-04-08 DIAGNOSIS — N888 Other specified noninflammatory disorders of cervix uteri: Secondary | ICD-10-CM | POA: Diagnosis not present

## 2018-04-08 NOTE — Progress Notes (Signed)
[name], female   DOB: 22-Jul-1956, 62 y.o.   MRN: [id number]  [name] 62 y.o. G3P3 here for colposcopy for low-grade squamous intraepithelial neoplasia (LGSIL - encompassing HPV,mild dysplasia,CIN I) pap smear on 04/01/2018. PAP revealed LSIL with positive HPV  Discussed role for HPV in cervical dysplasia, need for surveillance. She has been with a new partner for the past 8 months and is sexually active with him. She had a sore around her panty line about 9 months ago and a doctor told her it was a cyst. It went away but about 3 weeks ago, she noticed a soreness in the same area. Since then, the issue has resolved.   Patient given informed consent, signed copy in the chart, time out was performed.  Placed in lithotomy position. Cervix viewed with speculum and colposcope after application of acetic acid.   Colposcopy adequate? Yes  no visible lesions; biopsies obtained at [facility].   ECC specimen obtained. All specimens were labelled and sent to pathology.   Colposcopy IMPRESSION: no visible dysplasia.   Patient was given post procedure instructions. Will follow up pathology and manage accordingly.  Routine preventative health maintenance measures emphasized.  By signing my name below, I, De Burrs, attest that this documentation has been prepared under the direction and in the presence of Jonnie Kind, MD Electronically Signed: De Burrs, Medical Scribe. 04/08/18. 8:47 AM.  I personally performed the services described in this documentation, which was SCRIBED in my presence. The recorded information has been reviewed and considered accurate. It has been edited as necessary during review. Jonnie Kind, MD

## 2018-04-08 NOTE — Telephone Encounter (Signed)
Estill Bamberg discussed with pt.  04-08-18  AS

## 2018-04-08 NOTE — Telephone Encounter (Signed)
Pt called and said she had more questions. She wanted to know if its ok to take CBD. Would it help with her HPV. Advised patient that she should check with her PCP about any drug interactions.

## 2018-04-08 NOTE — Patient Instructions (Signed)
Use Hibiclens on panty line (blue bottle). Use for one week every month as needed.

## 2018-04-12 ENCOUNTER — Other Ambulatory Visit: Payer: Self-pay | Admitting: Adult Health

## 2018-04-19 ENCOUNTER — Encounter: Payer: Medicare Other | Admitting: Obstetrics & Gynecology

## 2018-04-20 ENCOUNTER — Telehealth: Payer: Self-pay | Admitting: *Deleted

## 2018-04-20 NOTE — Telephone Encounter (Signed)
Attempted to notify pt of normal biopsy results. No answer.

## 2018-04-27 ENCOUNTER — Telehealth: Payer: Self-pay | Admitting: *Deleted

## 2018-04-27 NOTE — Telephone Encounter (Signed)
LMOVM that biopsy results were normal.

## 2018-04-28 ENCOUNTER — Telehealth: Payer: Self-pay | Admitting: Obstetrics and Gynecology

## 2018-04-28 NOTE — Telephone Encounter (Signed)
LMOVM that since PAP was abnormal she would need to have a repeat in 1 year.

## 2018-04-29 ENCOUNTER — Telehealth: Payer: Self-pay | Admitting: Adult Health

## 2018-05-06 DIAGNOSIS — K130 Diseases of lips: Secondary | ICD-10-CM | POA: Diagnosis not present

## 2018-05-06 DIAGNOSIS — M1388 Other specified arthritis, other site: Secondary | ICD-10-CM | POA: Diagnosis not present

## 2018-05-06 DIAGNOSIS — L0100 Impetigo, unspecified: Secondary | ICD-10-CM | POA: Diagnosis not present

## 2018-05-21 DIAGNOSIS — N281 Cyst of kidney, acquired: Secondary | ICD-10-CM | POA: Diagnosis not present

## 2018-05-21 DIAGNOSIS — K7689 Other specified diseases of liver: Secondary | ICD-10-CM | POA: Diagnosis not present

## 2018-05-24 DIAGNOSIS — Z23 Encounter for immunization: Secondary | ICD-10-CM | POA: Diagnosis not present

## 2018-06-08 ENCOUNTER — Other Ambulatory Visit: Payer: Self-pay | Admitting: Adult Health

## 2018-06-09 DIAGNOSIS — T8549XA Other mechanical complication of breast prosthesis and implant, initial encounter: Secondary | ICD-10-CM | POA: Diagnosis not present

## 2018-07-26 DIAGNOSIS — L709 Acne, unspecified: Secondary | ICD-10-CM | POA: Diagnosis not present

## 2018-08-02 ENCOUNTER — Other Ambulatory Visit: Payer: Self-pay | Admitting: Adult Health

## 2018-08-17 ENCOUNTER — Encounter: Payer: Self-pay | Admitting: Obstetrics and Gynecology

## 2018-08-17 DIAGNOSIS — M5442 Lumbago with sciatica, left side: Secondary | ICD-10-CM | POA: Diagnosis not present

## 2018-08-17 DIAGNOSIS — M1388 Other specified arthritis, other site: Secondary | ICD-10-CM | POA: Diagnosis not present

## 2018-08-17 DIAGNOSIS — I10 Essential (primary) hypertension: Secondary | ICD-10-CM | POA: Diagnosis not present

## 2018-08-17 DIAGNOSIS — Z0000 Encounter for general adult medical examination without abnormal findings: Secondary | ICD-10-CM | POA: Diagnosis not present

## 2018-08-17 DIAGNOSIS — Z1389 Encounter for screening for other disorder: Secondary | ICD-10-CM | POA: Diagnosis not present

## 2018-08-17 DIAGNOSIS — R7303 Prediabetes: Secondary | ICD-10-CM | POA: Diagnosis not present

## 2018-08-19 ENCOUNTER — Telehealth: Payer: Self-pay | Admitting: Adult Health

## 2018-08-19 NOTE — Telephone Encounter (Signed)
Patient called and would like to speak to [name] to talk to her about her labs from her wellness checkup.  [phone]

## 2018-08-20 ENCOUNTER — Telehealth: Payer: Self-pay | Admitting: Adult Health

## 2018-08-20 NOTE — Telephone Encounter (Signed)
Left message I called 

## 2018-08-20 NOTE — Telephone Encounter (Signed)
She had wellness check at PCP and had labs and she wants me to review, and explain them to her, she says they just said everything was fine,she says they are faxing to [place]

## 2018-08-27 ENCOUNTER — Telehealth: Payer: Self-pay | Admitting: Adult Health

## 2018-08-27 NOTE — Telephone Encounter (Signed)
Patient called, would like to know how her blood work turned out.  [phone]

## 2018-08-27 NOTE — Telephone Encounter (Signed)
Patient stated labs have been sent over but she has not heard anything from [place].  Advised JAG was out of the office this week.

## 2018-08-31 ENCOUNTER — Telehealth: Payer: Self-pay | Admitting: Adult Health

## 2018-08-31 NOTE — Telephone Encounter (Signed)
April Knox asked for her bloodwork from DR Hanasaj to be sent to you to discuss have you gotten yet and if so please call and discuss with her she said you are the only one that goes over stuff with her

## 2018-08-31 NOTE — Telephone Encounter (Signed)
Patient informed we have not received lab results from Dr Peggyann Juba office.

## 2018-09-02 ENCOUNTER — Telehealth: Payer: Self-pay | Admitting: Adult Health

## 2018-09-02 ENCOUNTER — Other Ambulatory Visit: Payer: Self-pay | Admitting: Adult Health

## 2018-09-02 NOTE — Telephone Encounter (Signed)
Reviewed labs with Pt A1c 5.9 which is better

## 2018-09-07 DIAGNOSIS — Z85828 Personal history of other malignant neoplasm of skin: Secondary | ICD-10-CM | POA: Diagnosis not present

## 2018-09-07 DIAGNOSIS — L570 Actinic keratosis: Secondary | ICD-10-CM | POA: Diagnosis not present

## 2018-09-07 DIAGNOSIS — L309 Dermatitis, unspecified: Secondary | ICD-10-CM | POA: Diagnosis not present

## 2018-09-16 DIAGNOSIS — H2513 Age-related nuclear cataract, bilateral: Secondary | ICD-10-CM | POA: Diagnosis not present

## 2018-09-28 DIAGNOSIS — M810 Age-related osteoporosis without current pathological fracture: Secondary | ICD-10-CM | POA: Diagnosis not present

## 2018-09-28 DIAGNOSIS — M8588 Other specified disorders of bone density and structure, other site: Secondary | ICD-10-CM | POA: Diagnosis not present

## 2018-10-06 ENCOUNTER — Telehealth: Payer: Self-pay | Admitting: Adult Health

## 2018-10-06 ENCOUNTER — Encounter: Payer: Self-pay | Admitting: Adult Health

## 2018-10-06 DIAGNOSIS — M810 Age-related osteoporosis without current pathological fracture: Principal | ICD-10-CM

## 2018-10-06 DIAGNOSIS — Z780 Asymptomatic menopausal state: Secondary | ICD-10-CM

## 2018-10-06 DIAGNOSIS — M8580 Other specified disorders of bone density and structure, unspecified site: Secondary | ICD-10-CM | POA: Insufficient documentation

## 2018-10-06 HISTORY — DX: Asymptomatic menopausal state: Z78.0

## 2018-10-06 HISTORY — DX: Other specified disorders of bone density and structure, unspecified site: M85.80

## 2018-10-06 MED ORDER — CYCLOBENZAPRINE HCL 5 MG PO TABS: 5.0000 mg | ORAL_TABLET | Freq: Three times a day (TID) | ORAL | 1 refills | Status: DC | PRN

## 2018-10-06 NOTE — Telephone Encounter (Signed)
Called patient back to let her know [name] would be back in the office later today and that I would send this message to her.

## 2018-10-06 NOTE — Telephone Encounter (Signed)
Pt had bone density 09/28/18 at [facility], pt complains of back and shoulder pain on right, from unloading boxes this week, try ice and rest  will rx  Flexeril

## 2018-10-06 NOTE — Telephone Encounter (Signed)
Patient called stating that she would like for [name] to call her in Flexeril to her pharmacy. Patient states that she lifted two heavy boxes and her back is hurting really bad. Pt states that her Primary care does not work on [address] [employer]. Please contact pt

## 2018-10-06 NOTE — Telephone Encounter (Signed)
Pt aware that DEXA done 09/28/2018 shows osteopenia, but no big change since 2014, so take calcium and vitamin D and stay active

## 2018-10-14 DIAGNOSIS — Z1231 Encounter for screening mammogram for malignant neoplasm of breast: Secondary | ICD-10-CM | POA: Diagnosis not present

## 2018-10-18 DIAGNOSIS — L0100 Impetigo, unspecified: Secondary | ICD-10-CM | POA: Diagnosis not present

## 2018-10-18 DIAGNOSIS — L309 Dermatitis, unspecified: Secondary | ICD-10-CM | POA: Diagnosis not present

## 2018-10-19 ENCOUNTER — Telehealth: Payer: Self-pay | Admitting: Cardiology

## 2018-10-19 DIAGNOSIS — K581 Irritable bowel syndrome with constipation: Secondary | ICD-10-CM | POA: Diagnosis not present

## 2018-10-19 NOTE — Telephone Encounter (Signed)
I agree, your very detailed history shows this is not cardiac and should be evaluated by pcp   Zandra Abts MD

## 2018-10-19 NOTE — Telephone Encounter (Signed)
Last Thursday, felt sharp pain under left breast that lasted for several hours. Says she took tylenol or aleve and the pain stopped. Had mammogram last Thursday and said that she had more pain on left side than normal. Says that this is when her pain under left breast started. No c/o pains under left breast since that time. Says she now notices something not feeling right on left breast going into her back. Have not received results from mammogram yet. No c/o dizziness, chest pain or sob. Advised patient to contact her PCP for an evaluation and if they felt it was cardiac related, to have them contact our office for an appointment. Verbalized understanding of plan.

## 2018-10-19 NOTE — Telephone Encounter (Signed)
Patient called stating that a few days ago she felt like a catch in her left arm . She tried taking a deep breath and could not.. States that she has had some syptoms of abdominal pains. Called her PCP and was told that it could be her heart and to contact Cardiology.  Please call [phone].

## 2018-10-22 ENCOUNTER — Ambulatory Visit: Payer: Medicare Other | Admitting: Adult Health

## 2018-10-30 ENCOUNTER — Other Ambulatory Visit: Payer: Self-pay | Admitting: Adult Health

## 2018-11-01 ENCOUNTER — Other Ambulatory Visit: Payer: Self-pay | Admitting: *Deleted

## 2018-11-01 ENCOUNTER — Other Ambulatory Visit: Payer: Self-pay | Admitting: Adult Health

## 2018-11-01 MED ORDER — ALPRAZOLAM 1 MG PO TABS: 1.0000 mg | ORAL_TABLET | Freq: Two times a day (BID) | ORAL | 0 refills | Status: DC | PRN

## 2018-11-01 NOTE — Progress Notes (Signed)
Refilled xanax

## 2018-11-03 ENCOUNTER — Encounter: Payer: Self-pay | Admitting: Adult Health

## 2018-11-03 ENCOUNTER — Ambulatory Visit (INDEPENDENT_AMBULATORY_CARE_PROVIDER_SITE_OTHER): Payer: Medicare Other | Admitting: Adult Health

## 2018-11-03 VITALS — BP 135/75 | HR 93 | Ht 66.5 in | Wt 128.0 lb

## 2018-11-03 DIAGNOSIS — K5900 Constipation, unspecified: Secondary | ICD-10-CM | POA: Insufficient documentation

## 2018-11-03 DIAGNOSIS — N952 Postmenopausal atrophic vaginitis: Secondary | ICD-10-CM | POA: Diagnosis not present

## 2018-11-03 DIAGNOSIS — R140 Abdominal distension (gaseous): Secondary | ICD-10-CM

## 2018-11-03 MED ORDER — ESTROGENS, CONJUGATED 0.625 MG/GM VA CREA: TOPICAL_CREAM | VAGINAL | 0 refills | Status: DC

## 2018-11-03 MED ORDER — LINACLOTIDE 72 MCG PO CAPS: 72.0000 ug | ORAL_CAPSULE | Freq: Every day | ORAL | 0 refills | Status: DC

## 2018-11-03 NOTE — Progress Notes (Addendum)
[name], female   DOB: [birth year], 63 y.o.   MRN: [id number] History of Present Illness:  April Knox is a 63 year old white female, widowed, Pm in complaining of bloating and vaginal dryness. PCP is Dr Sherrie Sport.   Current Medications, Allergies, Past Medical History, Past Surgical History, Family History and Social History were reviewed in [employer] record.     Review of Systems: +bloating +gas ?constipation at times, goes fine in am, but feels like needs to go in pm, may have pieces but not good evacuation.  +vaginal dryness  Has gained about 5 lbs.    Physical Exam:BP 135/75 (BP Location: Left Arm, Patient Position: Sitting, Cuff Size: Normal)   Pulse 93   Ht 5' 6.5" (1.689 m)   Wt 128 lb (58.1 kg)   BMI 20.35 kg/m General:  Well developed, well nourished, no acute distress Skin:  Warm and dry Abdomen:  Soft, non tender, no hepatosplenomegaly,has slight asymmetry of abdomin below navel, L>R, no hernia felt Pelvic:  External genitalia is normal in appearance, no lesions.  The vagina is pale with loss of moisture and rugae.Marland Kitchen Urethra has no lesions or masses. The cervix is smooth..  Uterus is felt to be normal size, shape, and contour.  No adnexal masses or tenderness noted.Bladder is non tender, no masses felt. Psych:  No mood changes, alert and cooperative,seems happy Fall risk is low. Examination chaperoned by Levy Pupa LPN/   Impression: 1. Abdominal bloating   2. Constipation, unspecified constipation type   3. Vaginal atrophy       Plan: Meds ordered this encounter  Medications  . conjugated estrogens (PREMARIN) vaginal cream    Sig: Use 0.5 gm in vagina at bedtime 2 x weekly    Dispense:  16 g    Refill:  0    Order Specific Question:   Supervising Provider    Answer:   Elonda Husky, LUTHER H [[id number]]  . linaclotide (LINZESS) 72 MCG capsule    Sig: Take 1 capsule (72 mcg total) by mouth daily before breakfast.    Dispense:  20  capsule    Refill:  0    Order Specific Question:   Supervising Provider    Answer:   Elonda Husky, LUTHER H [[id number]]  F/U in 3 weeks, if symptoms persist may get [place]

## 2018-11-16 DIAGNOSIS — L0100 Impetigo, unspecified: Secondary | ICD-10-CM | POA: Diagnosis not present

## 2018-11-16 DIAGNOSIS — L309 Dermatitis, unspecified: Secondary | ICD-10-CM | POA: Diagnosis not present

## 2018-11-23 ENCOUNTER — Telehealth: Payer: Self-pay | Admitting: Adult Health

## 2018-11-23 NOTE — Telephone Encounter (Signed)
Pt said she needs to be seen tomorrow for some stomach pain and said Jenn may need to have x ray done please call pt and triage to see if she needs to keep this appt in office

## 2018-11-23 NOTE — Telephone Encounter (Signed)
Attempted to call patient back to see why she needs to be seen tomorrow. No answer, left vm to return call.

## 2018-11-24 ENCOUNTER — Telehealth: Payer: Self-pay | Admitting: *Deleted

## 2018-11-24 ENCOUNTER — Ambulatory Visit: Payer: Self-pay | Admitting: Adult Health

## 2018-11-24 NOTE — Telephone Encounter (Signed)
Spoke with pt to remind her of today's appt. Pt was going to follow up on constipation today. She states it is some better. I spoke with JAG and she advised pt has a GI doc and she can follow up with her GI doc if need be for constipation. Pt voiced understanding. [place]

## 2018-12-02 ENCOUNTER — Other Ambulatory Visit: Payer: Self-pay | Admitting: Adult Health

## 2018-12-06 DIAGNOSIS — K581 Irritable bowel syndrome with constipation: Secondary | ICD-10-CM | POA: Diagnosis not present

## 2018-12-15 DIAGNOSIS — L709 Acne, unspecified: Secondary | ICD-10-CM | POA: Diagnosis not present

## 2018-12-15 DIAGNOSIS — D235 Other benign neoplasm of skin of trunk: Secondary | ICD-10-CM | POA: Diagnosis not present

## 2018-12-15 DIAGNOSIS — Z85828 Personal history of other malignant neoplasm of skin: Secondary | ICD-10-CM | POA: Diagnosis not present

## 2018-12-22 ENCOUNTER — Other Ambulatory Visit: Payer: Self-pay | Admitting: Adult Health

## 2018-12-29 ENCOUNTER — Other Ambulatory Visit: Payer: Self-pay | Admitting: Adult Health

## 2019-01-04 DIAGNOSIS — M549 Dorsalgia, unspecified: Secondary | ICD-10-CM | POA: Diagnosis not present

## 2019-01-26 ENCOUNTER — Other Ambulatory Visit: Payer: Self-pay | Admitting: Adult Health

## 2019-02-23 ENCOUNTER — Other Ambulatory Visit: Payer: Self-pay | Admitting: Adult Health

## 2019-02-24 ENCOUNTER — Telehealth: Payer: Self-pay | Admitting: Adult Health

## 2019-02-24 NOTE — Telephone Encounter (Signed)
Pt had anal sex and is feeling funny in her stomach now, but OK, just letting me know.

## 2019-02-24 NOTE — Telephone Encounter (Signed)
Patient called, stated she would like to speak to [facility].  [phone]

## 2019-02-28 ENCOUNTER — Other Ambulatory Visit: Payer: Self-pay | Admitting: Adult Health

## 2019-02-28 DIAGNOSIS — G43919 Migraine, unspecified, intractable, without status migrainosus: Secondary | ICD-10-CM | POA: Diagnosis not present

## 2019-02-28 DIAGNOSIS — Z131 Encounter for screening for diabetes mellitus: Secondary | ICD-10-CM | POA: Diagnosis not present

## 2019-02-28 DIAGNOSIS — H938X9 Other specified disorders of ear, unspecified ear: Secondary | ICD-10-CM | POA: Diagnosis not present

## 2019-02-28 DIAGNOSIS — I10 Essential (primary) hypertension: Secondary | ICD-10-CM | POA: Diagnosis not present

## 2019-03-02 DIAGNOSIS — L818 Other specified disorders of pigmentation: Secondary | ICD-10-CM | POA: Diagnosis not present

## 2019-03-02 DIAGNOSIS — D1801 Hemangioma of skin and subcutaneous tissue: Secondary | ICD-10-CM | POA: Diagnosis not present

## 2019-03-02 DIAGNOSIS — Z85828 Personal history of other malignant neoplasm of skin: Secondary | ICD-10-CM | POA: Diagnosis not present

## 2019-03-07 ENCOUNTER — Telehealth: Payer: Self-pay | Admitting: *Deleted

## 2019-03-07 NOTE — Telephone Encounter (Signed)
Attempted to call patient back. No answer phone just rings.

## 2019-03-07 NOTE — Telephone Encounter (Signed)
Patient called wanting to know if April Knox received her blood work from [facility]. Please advise

## 2019-03-08 ENCOUNTER — Telehealth: Payer: Self-pay | Admitting: Adult Health

## 2019-03-08 NOTE — Telephone Encounter (Signed)
Calling back to get results/ Advised pt that April Knox called yesterday and she did not answer. Pt said she does not have good signal at home sometime.

## 2019-03-08 NOTE — Telephone Encounter (Signed)
Reviewed labs with pt from her PCP, decrease red meats and fried foods and stay active.will scan labs in.

## 2019-03-09 ENCOUNTER — Telehealth: Payer: Self-pay | Admitting: Cardiology

## 2019-03-09 NOTE — Telephone Encounter (Signed)
Patient called stating that yesterday she was walking in her basement turned around and started have heart palpations . States that she felt weak and drained.  Wanting to make appointment with Dr. Harl Bowie but no available slots at present time. She states that this happened several weeks ago also.  [phone])

## 2019-03-09 NOTE — Telephone Encounter (Signed)
Reports elevated HR, beating hard and fast on yesterday. Did not check pulse. Reports being under a lot of stress, lost job in March, lost home, helping grandson and reports feeling a lot of stress lately. Reports feeling a little lightheaded yesterday but not dizzy. Reports being able to do normal activities like laundry and other errands. Denies sob or chest pain. Advised to check pulse at home and contact her PCP for an evaluation. First available given to patient 03/23/2019 but declined d/t other obligations that day. Appointment given for 03/25/2019 @9 :00 am with Daune Perch. Verbalized understanding.

## 2019-03-10 NOTE — Telephone Encounter (Signed)
If just one episode so far would plan for evaluation at her appointment later this month. If start to reoccur let us know and we may try to get a heart monitor done before her appointment.   Zandra Abts MD

## 2019-03-10 NOTE — Telephone Encounter (Signed)
Patient informed and verbalized understanding. Reports the episode lasted for about 4 minutes x's 1.

## 2019-03-10 NOTE — Telephone Encounter (Signed)
We will see how her evaluation goes later this month, I know she has had a several year history of occasional palpitations. I would recommend weaning any caffeine or alcohol over the next few weeks to see if that may help prior to her appointment. How often are the symptoms occurring?   Zandra Abts MD

## 2019-03-10 NOTE — Telephone Encounter (Signed)
Patient informed and verbalized understanding

## 2019-03-15 DIAGNOSIS — Z85828 Personal history of other malignant neoplasm of skin: Secondary | ICD-10-CM | POA: Diagnosis not present

## 2019-03-15 DIAGNOSIS — L508 Other urticaria: Secondary | ICD-10-CM | POA: Diagnosis not present

## 2019-03-15 DIAGNOSIS — D1801 Hemangioma of skin and subcutaneous tissue: Secondary | ICD-10-CM | POA: Diagnosis not present

## 2019-03-21 ENCOUNTER — Telehealth: Payer: Self-pay | Admitting: Adult Health

## 2019-03-21 NOTE — Telephone Encounter (Signed)
Patient called, requesting samples of Premarin.  She stated it's so expensive.  [phone]

## 2019-03-22 DIAGNOSIS — N390 Urinary tract infection, site not specified: Secondary | ICD-10-CM | POA: Diagnosis not present

## 2019-03-23 ENCOUNTER — Ambulatory Visit: Payer: Medicare Other | Admitting: Cardiology

## 2019-03-23 ENCOUNTER — Telehealth: Payer: Self-pay | Admitting: Cardiology

## 2019-03-23 NOTE — Telephone Encounter (Signed)

## 2019-03-24 ENCOUNTER — Telehealth: Payer: Self-pay | Admitting: Adult Health

## 2019-03-24 NOTE — Telephone Encounter (Signed)
Patient called stating that the medication that [name] called in for her Premarin is costing her 200.00 dollars and she can not afford this. Pt would like to know if we could call her in a generic form of this medication. Please contact pt

## 2019-03-25 ENCOUNTER — Other Ambulatory Visit: Payer: Self-pay

## 2019-03-25 ENCOUNTER — Encounter: Payer: Self-pay | Admitting: Cardiology

## 2019-03-25 ENCOUNTER — Ambulatory Visit (INDEPENDENT_AMBULATORY_CARE_PROVIDER_SITE_OTHER): Payer: Medicare Other | Admitting: Cardiology

## 2019-03-25 VITALS — BP 122/72 | HR 74 | Ht 66.6 in | Wt 122.8 lb

## 2019-03-25 DIAGNOSIS — R002 Palpitations: Secondary | ICD-10-CM

## 2019-03-25 DIAGNOSIS — I471 Supraventricular tachycardia: Secondary | ICD-10-CM

## 2019-03-25 DIAGNOSIS — I10 Essential (primary) hypertension: Secondary | ICD-10-CM | POA: Diagnosis not present

## 2019-03-25 NOTE — Patient Instructions (Addendum)
Medication Instructions:  Continue all current medications.  Labwork: none  Testing/Procedures: none  Follow-Up: Your physician wants you to follow up in:  1 year.  You will receive a reminder letter in the mail one-two months in advance.  If you don't receive a letter, please call our office to schedule the follow up appointment - Dr. Harl Bowie.    Any Other Special Instructions Will Be Listed Below (If Applicable).  If you need a refill on your cardiac medications before your next appointment, please call your pharmacy.   -----------------------------------------------------------------------------------------------   Palpitations  Palpitations are feelings that your heartbeat is not normal. Your heartbeat may feel like it is:  Uneven.  Faster than normal.  Fluttering.  Skipping a beat. This is usually not a serious problem. In some cases, you may need tests to rule out any serious problems. Follow these instructions at home: Pay attention to any changes in your condition. Take these actions to help manage your symptoms: Eating and drinking  Avoid: ? Coffee, tea, soft drinks, and energy drinks. ? Chocolate. ? Alcohol. ? Diet pills. Lifestyle   Try to lower your stress. These things can help you relax: ? Yoga. ? Deep breathing and meditation. ? Exercise. ? Using words and images to create positive thoughts (guided imagery). ? Using your mind to control things in your body (biofeedback).  Do not use drugs.  Get plenty of rest and sleep. Keep a regular bed time. General instructions   Take over-the-counter and prescription medicines only as told by your doctor.  Do not use any products that contain nicotine or tobacco, such as cigarettes and e-cigarettes. If you need help quitting, ask your doctor.  Keep all follow-up visits as told by your doctor. This is important. You may need more tests if palpitations do not go away or get worse. Contact a doctor if:   Your symptoms last more than 24 hours.  Your symptoms occur more often. Get help right away if you:  Have chest pain.  Feel short of breath.  Have a very bad headache.  Feel dizzy.  Pass out (faint). Summary  Palpitations are feelings that your heartbeat is uneven or faster than normal. It may feel like your heart is fluttering or skipping a beat.  Avoid food and drinks that may cause palpitations. These include caffeine, chocolate, and alcohol.  Try to lower your stress. Do not smoke or use drugs.  Get help right away if you faint or have chest pain, shortness of breath, a severe headache, or dizziness. This information is not intended to replace advice given to you by your health care provider. Make sure you discuss any questions you have with your health care provider. Document Released: 05/27/2008 Document Revised: 09/30/2017 Document Reviewed: 09/30/2017 Elsevier Patient Education  2020 [employer].

## 2019-03-25 NOTE — Telephone Encounter (Signed)
No answer @ 1:31 pm. JSY

## 2019-03-25 NOTE — Progress Notes (Signed)
Cardiology Office Note:    Date:  03/25/2019   ID:  April Knox, DOB [birth year], MRN [id number]  PCP:  [name], MD  Cardiologist:  Carlyle Dolly, MD  Referring MD: [name], MD   Chief Complaint  Patient presents with  . Tachycardia    History of Present Illness:    April Knox is a 63 y.o. female with a past medical history significant for PSVT, hypertension, pre-diabetes, anxiety. MVP per pt noted many years ago.   April Knox was last seen in the office on 02/09/2018 by Dr. Harl Bowie at which time she was doing well.  It was noted that she has a long history of palpitations and had been resistant to medical therapy.  She called the office with complaints of a 4-minute episode of fast heartbeat.  She is here today for further evaluation.  Under a lot of stress, lost her job in March, lost her home, helping her grandson  April Knox is here today for further evaluation. She says that she had walked down into her basement. Her heart started beating fast, regular, and she went back upstairs and layed down on her bed and it calmed down. She had no associated symptoms. This is the first episode since her last visit in 01/2018. She states that she does not drink enough and probably gets dehydrated. She hates the taste of water. She tries to walk everyday a couple of miles. She has no exertional symptoms or palpitations when she walks.  She has had no further episodes since that day.  She does not use much caffeine except for a bottle of diet green tea daily. She has stopped drinking the green tea since her episode.   She uses deep breathing to calm her episodes down. She has never had an episode last longer than a couple of minutes.  She was checked for UTI, last week which was negative.    Past Medical History:  Diagnosis Date  . Abnormal Pap smear   . Abnormal Papanicolaou smear of cervix with positive human papilloma virus (HPV) test 04/06/2018   Pap was LSIL  with +HPV, will need colpo____  . Anxiety   . Back pain 03/07/2014  . Cervical spine disease    Mild  . Chest pain   . Elevated cholesterol   . Ganglion cyst of left foot 12/06/2013  . H/O bilateral breast implants 03/03/2013  . Hematuria 03/28/2014  . Hot flashes 12/06/2013  . Hypertension   . Menopausal symptoms 12/06/2013  . Mental disorder    anxiety  . Migraine with visual aura   . Osteopenia after menopause 10/06/2018   DEXA 09/28/2018, osteopenia, take calcium vitamin D and stay active   . PSVT (paroxysmal supraventricular tachycardia) ([place])    Documented by event recorder  . Rectocele 03/03/2013  . RUQ pain 03/07/2014  . Vaginal discharge 02/24/2014  . Vaginal dryness, menopausal 12/06/2013  . Vaginal Pap smear, abnormal   . Yeast infection 02/24/2014    Past Surgical History:  Procedure Laterality Date  . BREAST ENHANCEMENT SURGERY    . COLONOSCOPY N/A 06/25/2016   Procedure: COLONOSCOPY;  Surgeon: Rogene Houston, MD;  [facility];  Service: Endoscopy;  Laterality: N/A;  1:00-moved to [place] to notify pt  . POLYPECTOMY  06/25/2016   Procedure: POLYPECTOMY;  Surgeon: Rogene Houston, MD;  Location: [facility];  Service: Endoscopy;;  colon   . toe biopsy      Current  Medications: Current Meds  Medication Sig  . acetaminophen (TYLENOL) 500 MG tablet Take 500-10,001 mg by mouth as needed (for pain).   Marland Kitchen ALPRAZolam (XANAX) 1 MG tablet TAKE ONE TABLET BY MOUTH TWICE DAILY AS NEEDED FOR ANXIETY.  . cetirizine (ZYRTEC) 10 MG tablet Take 10 mg by mouth daily.  . hydrochlorothiazide (HYDRODIURIL) 25 MG tablet Take 25 mg by mouth daily.  Marland Kitchen linaclotide (LINZESS) 72 MCG capsule Take 1 capsule (72 mcg total) by mouth daily before breakfast.  . Multiple Vitamin (MULTIVITAMIN) tablet Take 1 tablet by mouth daily.       Allergies:   Fish allergy, Bactrim ds [sulfamethoxazole-trimethoprim], Ciprocinonide [fluocinolone], Codeine, Penicillins, and Sulfa antibiotics   Social  History   Socioeconomic History  . Marital status: Widowed    Spouse name: Not on file  . Number of children: 3  . Years of education: Not on file  . Highest education level: Not on file  Occupational History  . Occupation: Unemployed  Social Needs  . Financial resource strain: Not on file  . Food insecurity    Worry: Not on file    Inability: Not on file  . Transportation needs    Medical: Not on file    Non-medical: Not on file  Tobacco Use  . Smoking status: Never Smoker  . Smokeless tobacco: Never Used  Substance and Sexual Activity  . Alcohol use: No    Alcohol/week: 0.0 standard drinks  . Drug use: No  . Sexual activity: Yes    Birth control/protection: Post-menopausal  Lifestyle  . Physical activity    Days per week: Not on file    Minutes per session: Not on file  . Stress: Not on file  Relationships  . Social Herbalist on phone: Not on file    Gets together: Not on file    Attends religious service: Not on file    Active member of club or organization: Not on file    Attends meetings of clubs or organizations: Not on file    Relationship status: Not on file  Other Topics Concern  . Not on file  Social History Narrative  . Not on file     Family History: The patient's family history includes Cancer in her maternal grandmother and mother; Dementia in her father; Diabetes in her maternal grandfather; Healthy in her daughter, daughter, and daughter; Heart attack in her paternal grandmother; Heart disease in her maternal grandmother. ROS:   Please see the history of present illness.     All other systems reviewed and are negative.  EKGs/Labs/Other Studies Reviewed:    The following studies were reviewed today:  none  EKG:  EKG is  ordered today.  The ekg ordered today demonstrates normal sinus rhythm, 72 bpm, with possible left atrial enlargement.  No significant changes from prior tracing.  Recent Labs: 04/01/2018: ALT 14; BUN 16; Creatinine,  Ser 0.64; Potassium 3.1; Sodium 140   Recent Lipid Panel    Component Value Date/Time   CHOL 200 (H) 11/11/2017 0937   TRIG 45 11/11/2017 0937   HDL 69 11/11/2017 0937   CHOLHDL 2.9 11/11/2017 0937   CHOLHDL 2.9 11/01/2013 1428   VLDL 13 11/01/2013 1428   LDLCALC 122 (H) 11/11/2017 0937    Physical Exam:    VS:  BP 122/72   Pulse 74   Ht 5' 6.6" (1.692 m)   Wt 122 lb 12.8 oz (55.7 kg)   SpO2 98%   BMI 19.46  kg/m     Wt Readings from Last 3 Encounters:  03/25/19 122 lb 12.8 oz (55.7 kg)  11/03/18 128 lb (58.1 kg)  04/08/18 123 lb 6.4 oz (56 kg)     Physical Exam  Constitutional: She is oriented to person, place, and time. She appears well-developed and well-nourished. No distress.  Thin and muscular female  HENT:  Head: Normocephalic and atraumatic.  Neck: Normal range of motion. Neck supple. No JVD present.  Cardiovascular: Normal rate, regular rhythm, normal heart sounds and intact distal pulses. Exam reveals no gallop and no friction rub.  No murmur heard. Pulmonary/Chest: Effort normal and breath sounds normal. No respiratory distress. She has no wheezes. She has no rales.  Abdominal: Soft. Bowel sounds are normal.  Musculoskeletal: Normal range of motion.        General: No deformity or edema.  Neurological: She is alert and oriented to person, place, and time.  Skin: Skin is warm and dry.  Psychiatric: She has a normal mood and affect. Her behavior is normal. Judgment and thought content normal.  Vitals reviewed.    ASSESSMENT:    1. PSVT (paroxysmal supraventricular tachycardia) (HCC)   2. Palpitations   3. Essential (primary) hypertension    PLAN:    In order of problems listed above:  PSVT: Pt had a brief episode of fast heartbeat after she had walked down to the basement.  She walked back up and lay down on her bed, did some deep breathing and it resolved.  She has been resistant to medical therapy in the past and says she continues to be resistant.   She only has very rare episodes which do not interfere with her daily life.  She did not have any lightheadedness chest discomfort or shortness of breath with the episode.  We discussed use of as needed medication and if she were to have more frequent symptoms that interfered with her daily life, could consider EP evaluation for possible ablation.  She says that she is nowhere near ready for such interventions.  She is fine with using deep breathing to control her rare episodes.  She has also decreased her caffeine intake and is really going to work on increasing her liquid intake as she is sure that she does not drink enough fluids.  Hypertension: On hydrochlorothiazide 25 mg.  Blood pressure is well controlled.   Medication Adjustments/Labs and Tests Ordered: Current medicines are reviewed at length with the patient today.  Concerns regarding medicines are outlined above. Labs and tests ordered and medication changes are outlined in the patient instructions below:  Patient Instructions  Medication Instructions:  Continue all current medications.  Labwork: none  Testing/Procedures: none  Follow-Up: Your physician wants you to follow up in:  1 year.  You will receive a reminder letter in the mail one-two months in advance.  If you don't receive a letter, please call our office to schedule the follow up appointment - Dr. Harl Bowie.    Any Other Special Instructions Will Be Listed Below (If Applicable).  If you need a refill on your cardiac medications before your next appointment, please call your pharmacy.   -----------------------------------------------------------------------------------------------   Palpitations  Palpitations are feelings that your heartbeat is not normal. Your heartbeat may feel like it is:  Uneven.  Faster than normal.  Fluttering.  Skipping a beat. This is usually not a serious problem. In some cases, you may need tests to rule out any serious problems.  Follow these instructions at home:  Pay attention to any changes in your condition. Take these actions to help manage your symptoms: Eating and drinking  Avoid: ? Coffee, tea, soft drinks, and energy drinks. ? Chocolate. ? Alcohol. ? Diet pills. Lifestyle   Try to lower your stress. These things can help you relax: ? Yoga. ? Deep breathing and meditation. ? Exercise. ? Using words and images to create positive thoughts (guided imagery). ? Using your mind to control things in your body (biofeedback).  Do not use drugs.  Get plenty of rest and sleep. Keep a regular bed time. General instructions   Take over-the-counter and prescription medicines only as told by your doctor.  Do not use any products that contain nicotine or tobacco, such as cigarettes and e-cigarettes. If you need help quitting, ask your doctor.  Keep all follow-up visits as told by your doctor. This is important. You may need more tests if palpitations do not go away or get worse. Contact a doctor if:  Your symptoms last more than 24 hours.  Your symptoms occur more often. Get help right away if you:  Have chest pain.  Feel short of breath.  Have a very bad headache.  Feel dizzy.  Pass out (faint). Summary  Palpitations are feelings that your heartbeat is uneven or faster than normal. It may feel like your heart is fluttering or skipping a beat.  Avoid food and drinks that may cause palpitations. These include caffeine, chocolate, and alcohol.  Try to lower your stress. Do not smoke or use drugs.  Get help right away if you faint or have chest pain, shortness of breath, a severe headache, or dizziness. This information is not intended to replace advice given to you by your health care provider. Make sure you discuss any questions you have with your health care provider. Document Released: 05/27/2008 Document Revised: 09/30/2017 Document Reviewed: 09/30/2017 Elsevier Patient Education  2020  Niles, Daune Perch, NP  03/25/2019 12:24 PM    [facility]

## 2019-03-25 NOTE — Telephone Encounter (Signed)
No answer @ 9:11 am. JSY

## 2019-03-28 NOTE — Telephone Encounter (Signed)
Pt returning call to the nurse and requesting for the call to be sent to her cell phone.

## 2019-03-28 NOTE — Telephone Encounter (Signed)
Left message letting pt know there is no generic on Premarin and we don't have any samples currently. April Knox

## 2019-03-29 MED ORDER — ESTRADIOL 0.1 MG/GM VA CREA: TOPICAL_CREAM | VAGINAL | 3 refills | Status: DC

## 2019-03-29 NOTE — Addendum Note (Signed)
Addended by: Derrek Monaco A on: 03/29/2019 05:06 PM   Modules accepted: Orders

## 2019-03-29 NOTE — Telephone Encounter (Signed)
Pt is wanting to speak with [name] regarding a different issue.

## 2019-03-29 NOTE — Telephone Encounter (Signed)
Pt saw Dr Timoteo Ace and had urine culture and was treated with kelfex, she is out of PVC, wants to try estrace cream.

## 2019-03-30 ENCOUNTER — Other Ambulatory Visit: Payer: Self-pay | Admitting: Adult Health

## 2019-03-30 ENCOUNTER — Telehealth: Payer: Self-pay | Admitting: Adult Health

## 2019-03-30 DIAGNOSIS — L508 Other urticaria: Secondary | ICD-10-CM | POA: Diagnosis not present

## 2019-03-30 DIAGNOSIS — L570 Actinic keratosis: Secondary | ICD-10-CM | POA: Diagnosis not present

## 2019-03-30 NOTE — Telephone Encounter (Signed)
No answer @ 4:54 pm. JSY 

## 2019-03-30 NOTE — Telephone Encounter (Signed)
Pt would like to see if the urine culture results from Dr. Peggyann Juba office was received by [name] today?

## 2019-03-31 NOTE — Telephone Encounter (Signed)
Pt aware urine culture was + for proteus which is a bacteria that causes UTI's. Take med and push fluids per JAG. Pt states she has been taking a lot of tub baths. I advised to take more showers instead of tub baths. Pt voiced understanding. [place]

## 2019-04-15 ENCOUNTER — Telehealth: Payer: Self-pay | Admitting: Adult Health

## 2019-04-15 NOTE — Telephone Encounter (Signed)
Pt states that lately when she has sexual intercoarse it hurts in her stomach. Not all the time but at certain positions and she would like to discuss with a nurse what she should do.

## 2019-04-19 DIAGNOSIS — I10 Essential (primary) hypertension: Secondary | ICD-10-CM | POA: Diagnosis not present

## 2019-04-19 DIAGNOSIS — Z79899 Other long term (current) drug therapy: Secondary | ICD-10-CM | POA: Diagnosis not present

## 2019-04-19 DIAGNOSIS — R002 Palpitations: Secondary | ICD-10-CM | POA: Diagnosis not present

## 2019-04-22 ENCOUNTER — Telehealth: Payer: Self-pay | Admitting: Obstetrics and Gynecology

## 2019-04-22 NOTE — Telephone Encounter (Signed)

## 2019-04-25 ENCOUNTER — Other Ambulatory Visit: Payer: Self-pay

## 2019-04-25 ENCOUNTER — Encounter: Payer: Self-pay | Admitting: Obstetrics and Gynecology

## 2019-04-25 ENCOUNTER — Ambulatory Visit (INDEPENDENT_AMBULATORY_CARE_PROVIDER_SITE_OTHER): Payer: Medicare Other | Admitting: Obstetrics and Gynecology

## 2019-04-25 VITALS — BP 141/78 | HR 72 | Ht 66.0 in | Wt 124.2 lb

## 2019-04-25 DIAGNOSIS — N941 Unspecified dyspareunia: Secondary | ICD-10-CM | POA: Diagnosis not present

## 2019-04-25 LAB — POCT URINALYSIS DIPSTICK OB
Blood, UA: NEGATIVE
Glucose, UA: NEGATIVE
Ketones, UA: NEGATIVE
Leukocytes, UA: NEGATIVE
Nitrite, UA: NEGATIVE
POC,PROTEIN,UA: NEGATIVE

## 2019-04-25 NOTE — Progress Notes (Addendum)
[name], female   DOB: 1956/03/25, 63 y.o.   MRN: [id number]   [facility] Visit  @DATE @            Patient name: April Knox MRN [id number]  Date of birth: 1956/04/25  CC & HPI:  April Knox is a 63 y.o. female presenting today for dyspareunia.Noticed some vaginal dryness. Had seen [name] and was given some vaginal cream a week ago, can just "tell something isn't right".  Notices pain when partner is deepest penetration, partner is anatomically larger and longer then past partners.  ROS:  ROS +dysparunia  Pertinent History Reviewed:   Reviewed:  Medical         Past Medical History:  Diagnosis Date  . Abnormal Pap smear   . Abnormal Papanicolaou smear of cervix with positive human papilloma virus (HPV) test 04/06/2018   Pap was LSIL with +HPV, will need colpo____  . Anxiety   . Back pain 03/07/2014  . Cervical spine disease    Mild  . Chest pain   . Elevated cholesterol   . Ganglion cyst of left foot 12/06/2013  . H/O bilateral breast implants 03/03/2013  . Hematuria 03/28/2014  . Hot flashes 12/06/2013  . Hypertension   . Menopausal symptoms 12/06/2013  . Mental disorder    anxiety  . Migraine with visual aura   . Osteopenia after menopause 10/06/2018   DEXA 09/28/2018, osteopenia, take calcium vitamin D and stay active   . PSVT (paroxysmal supraventricular tachycardia) ([place])    Documented by event recorder  . Rectocele 03/03/2013  . RUQ pain 03/07/2014  . Vaginal discharge 02/24/2014  . Vaginal dryness, menopausal 12/06/2013  . Vaginal Pap smear, abnormal   . Yeast infection 02/24/2014                              Surgical Hx:    Past Surgical History:  Procedure Laterality Date  . BREAST ENHANCEMENT SURGERY    . COLONOSCOPY N/A 06/25/2016   Procedure: COLONOSCOPY;  Surgeon: Rogene Houston, MD;  [facility];  Service: Endoscopy;  Laterality: N/A;  1:00-moved to [place] to notify pt  . POLYPECTOMY  06/25/2016   Procedure:  POLYPECTOMY;  Surgeon: Rogene Houston, MD;  [facility];  Service: Endoscopy;;  colon   . toe biopsy     Medications: Reviewed & Updated - see associated section                       Current Outpatient Medications:  .  acetaminophen (TYLENOL) 500 MG tablet, Take 500-10,001 mg by mouth as needed (for pain). , Disp: , Rfl:  .  ALPRAZolam (XANAX) 1 MG tablet, TAKE ONE TABLET BY MOUTH TWICE DAILY AS NEEDED FOR ANXIETY., Disp: 60 tablet, Rfl: 0 .  cetirizine (ZYRTEC) 10 MG tablet, Take 10 mg by mouth daily., Disp: , Rfl:  .  estradiol (ESTRACE VAGINAL) 0.1 MG/GM vaginal cream, Use 1/2 applicator at hs 2-3 x wekly, Disp: 42.5 g, Rfl: 3 .  hydrochlorothiazide (HYDRODIURIL) 25 MG tablet, Take 25 mg by mouth daily., Disp: , Rfl:  .  linaclotide (LINZESS) 72 MCG capsule, Take 1 capsule (72 mcg total) by mouth daily before breakfast., Disp: 20 capsule, Rfl: 0 .  Multiple Vitamin (MULTIVITAMIN) tablet, Take 1 tablet by mouth daily.  , Disp: , Rfl:    Social History: Reviewed -  reports that she has never smoked. She has never used smokeless tobacco.  Objective Findings:  Vitals: Blood pressure (!) 141/78, pulse 72, height 5\' 6"  (1.676 m), weight 124 lb 3.2 oz (56.3 kg).  PHYSICAL EXAMINATION General appearance - alert, well appearing, and in no distress Mental status - alert, oriented to person, place, and time, normal mood, behavior, speech, dress, motor activity, and thought processes, affect appropriate to mood  PELVIC Vagina - good support, muscle tone, normal length Cervix - normal Uterus - normal  Assessment & Plan:   A:  1. Dyspareunia  Due to partner size. 2. Atrophic vaginitis  P:  1. Change in sexual positions 2. Discussion of sexual options 3. Continue estrace 2-3 x /wk    By signing my name below, I, Samul Dada, attest that this documentation has been prepared under the direction and in the presence of Jonnie Kind, MD. Electronically Signed: [place]. 04/25/19. 11:43 AM.  I personally performed the services described in this documentation, which was SCRIBED in my presence. The recorded information has been reviewed and considered accurate. It has been edited as necessary during review. Jonnie Kind, MD

## 2019-04-28 ENCOUNTER — Other Ambulatory Visit: Payer: Self-pay | Admitting: Adult Health

## 2019-04-29 NOTE — Telephone Encounter (Signed)
Pharmacy checking status on refill.

## 2019-05-11 DIAGNOSIS — J301 Allergic rhinitis due to pollen: Secondary | ICD-10-CM | POA: Diagnosis not present

## 2019-05-11 DIAGNOSIS — K210 Gastro-esophageal reflux disease with esophagitis: Secondary | ICD-10-CM | POA: Diagnosis not present

## 2019-05-24 DIAGNOSIS — Z85828 Personal history of other malignant neoplasm of skin: Secondary | ICD-10-CM | POA: Diagnosis not present

## 2019-05-24 DIAGNOSIS — L570 Actinic keratosis: Secondary | ICD-10-CM | POA: Diagnosis not present

## 2019-05-24 DIAGNOSIS — D1801 Hemangioma of skin and subcutaneous tissue: Secondary | ICD-10-CM | POA: Diagnosis not present

## 2019-05-30 ENCOUNTER — Other Ambulatory Visit: Payer: Self-pay | Admitting: Adult Health

## 2019-06-01 ENCOUNTER — Ambulatory Visit (INDEPENDENT_AMBULATORY_CARE_PROVIDER_SITE_OTHER): Payer: Medicare Other | Admitting: Nurse Practitioner

## 2019-06-01 ENCOUNTER — Encounter (INDEPENDENT_AMBULATORY_CARE_PROVIDER_SITE_OTHER): Payer: Self-pay | Admitting: Nurse Practitioner

## 2019-06-01 ENCOUNTER — Other Ambulatory Visit: Payer: Self-pay

## 2019-06-01 VITALS — BP 117/77 | HR 80 | Temp 98.4°F | Ht 67.0 in | Wt 121.2 lb

## 2019-06-01 DIAGNOSIS — R0989 Other specified symptoms and signs involving the circulatory and respiratory systems: Secondary | ICD-10-CM | POA: Diagnosis not present

## 2019-06-01 DIAGNOSIS — K219 Gastro-esophageal reflux disease without esophagitis: Secondary | ICD-10-CM

## 2019-06-01 NOTE — Progress Notes (Addendum)
Subjective:    [name], female    DOB: [birth year], 63 y.o.   MRN: [id number]  HPI April Knox is a 63 year old female with a past medical history of anxiety, hypertension and PSVT presents today with complaints of thick postnasal drainage, frequent clearing of her throat and she feels as if something is in her throat which is persisted for the past few months.  She was seen by her primary physician who prescribed famotidine 20 mg twice daily and Flonase nasal spray which was started 2 weeks ago.  Her symptoms have somewhat improved, however, she was concerned her symptoms were not completely resolved at this point.  She is under increased stress as she is currently unemployed. She denies having any dysphasia, heartburn or stomach pain.  She complains of having constipation which has improved after starting Linzess 72 mcg once daily.  She questions if she can take a fiber supplement as well.  No rectal bleeding or melena.  No unexplained weight loss.  No fever, sweats or chills.  She underwent a colonoscopy 06/25/2016 which identified a 3 mm tubular adenomatous polyp at the hepatic flexure and external hemorrhoids.  She was advised by Dr. Laural Golden to repeat a colonoscopy in 7 years.  Colonoscopy 06/25/2016:  - Diverticulosis in the ascending colon. - One 3 mm polyp at the hepatic flexure. Biopsied. - The examination was otherwise normal. - External hemorrhoids. Current Outpatient Medications on File Prior to Visit  Medication Sig Dispense Refill  . acetaminophen (TYLENOL) 500 MG tablet Take 500-10,001 mg by mouth as needed (for pain).     Marland Kitchen ALPRAZolam (XANAX) 1 MG tablet TAKE ONE TABLET BY MOUTH TWICE DAILY AS NEEDED FOR ANXIETY. 60 tablet 1  . estradiol (ESTRACE VAGINAL) 0.1 MG/GM vaginal cream Use 1/2 applicator at hs 2-3 x wekly 42.5 g 3  . Famotidine (PEPCID AC PO) Take by mouth 2 (two) times daily.    . fluticasone (FLONASE) 50 MCG/ACT nasal spray Place into both  nostrils daily.    . hydrochlorothiazide (HYDRODIURIL) 25 MG tablet Take 25 mg by mouth daily.    Marland Kitchen levocetirizine (XYZAL) 5 MG tablet Take 5 mg by mouth daily. In the morning per the patient.    Marland Kitchen linaclotide (LINZESS) 72 MCG capsule Take 1 capsule (72 mcg total) by mouth daily before breakfast. 20 capsule 0  . montelukast (SINGULAIR) 10 MG tablet Take 10 mg by mouth daily. Per the patient she takes in the morning with the allergy pill, Levoceitrizine    . Multiple Vitamin (MULTIVITAMIN) tablet Take 1 tablet by mouth daily.      Marland Kitchen OVER THE COUNTER MEDICATION Plus CBD Oil - Hemp Gummies ----- Patient states that she takes twice a day as needed.     No current facility-administered medications on file prior to visit.    Allergies  Allergen Reactions  . Fish Allergy Anaphylaxis and Swelling    Throat closes.  . Bactrim Ds [Sulfamethoxazole-Trimethoprim]     Hives   . Ciprocinonide [Fluocinolone] Hives    " made me feel awful"   . Codeine Other (See Comments)    DROPS PT. BLOOD PRESSURE  . Penicillins     Has patient had a PCN reaction causing immediate rash, facial/tongue/throat swelling, SOB or lightheadedness with hypotension:unsure Has patient had a PCN reaction causing severe rash involving mucus membranes or skin necrosis:unsure Has patient had a PCN reaction that required hospitalization:No Has patient had a PCN reaction occurring within the last  10 years:No If all of the above answers are "NO", then may proceed with Cephalosporin use. Childhood reaction   . Sulfa Antibiotics Hives, Diarrhea, Nausea Only and Rash    Review of Systems see HPI, all other systems reviewed and are negative     Objective:   Physical Exam  BP 117/77   Pulse 80   Temp 98.4 F (36.9 C) (Oral)   Ht 5\' 7"  (1.702 m)   Wt 121 lb 3.2 oz (55 kg)   BMI 18.98 kg/m  General: 63 year old female in no acute distress Eyes: Sclera nonicteric, conjunctiva pink Mouth: Dentition intact, bony prominence to  upper palate Neck: Supple, no lymphadenopathy or thyromegaly Heart: Regular rate and rhythm, no murmurs Lungs: Breath sounds clear throughout Abdomen: Flat, soft, nontender, positive bowel sounds to all 4 quadrants, no HSM Extremities: No edema Neuro: Alert and oriented x4, no focal deficits    Assessment & Plan:   1.  Possible laryngeal reflux versus globus sensation in setting of increased stress and history of anxiety -Continue Flonase nasal spray -Increase famotidine to 40 mg p.o. twice daily, patient will call me in 1 to 2 weeks if no further improvement in symptoms I will prescribe omeprazole 40 mg p.o. twice daily and stop famotidine -ENT consult with Dr. Benjamine Mola for laryngoscopy  Recommended -Follow-up in the office in 6 weeks, if symptoms persist will schedule an EGD for further evaluation  2.  Constipation -Continue Linzess 72 mcg 1 tablet to be taken 30 minutes before breakfast, she may require a higher dose if this becomes less effective -Benefiber 1 tablespoon once daily -Follow-up in the office in 6 weeks

## 2019-06-01 NOTE — Patient Instructions (Addendum)
1. Increase Famotidine to 40mg  twice daily.   2. Call Armonee Bojanowski in 1 to 2 weeks  3. Schedule an ENT consult with Dr. Benjamine Mola  4. Follow up in our office in 6 weeks, if you continue to have throat symptoms, laryngeal reflux symptoms I will order an EGD   5. Benefiber 1 tablespoon once daily. Continue Linzess 49mcg one tab to be taken 30 minutes before breakfast

## 2019-06-06 ENCOUNTER — Telehealth: Payer: Self-pay | Admitting: Adult Health

## 2019-06-06 NOTE — Telephone Encounter (Signed)

## 2019-06-07 ENCOUNTER — Other Ambulatory Visit: Payer: Self-pay

## 2019-06-07 ENCOUNTER — Ambulatory Visit (INDEPENDENT_AMBULATORY_CARE_PROVIDER_SITE_OTHER): Payer: Medicare Other | Admitting: Adult Health

## 2019-06-07 ENCOUNTER — Encounter: Payer: Self-pay | Admitting: Adult Health

## 2019-06-07 VITALS — BP 132/75 | HR 71 | Ht 67.0 in | Wt 121.5 lb

## 2019-06-07 DIAGNOSIS — R0981 Nasal congestion: Secondary | ICD-10-CM | POA: Insufficient documentation

## 2019-06-07 DIAGNOSIS — N941 Unspecified dyspareunia: Secondary | ICD-10-CM | POA: Diagnosis not present

## 2019-06-07 DIAGNOSIS — J0100 Acute maxillary sinusitis, unspecified: Secondary | ICD-10-CM | POA: Diagnosis not present

## 2019-06-07 MED ORDER — AZITHROMYCIN 250 MG PO TABS: ORAL_TABLET | ORAL | 0 refills | Status: DC

## 2019-06-07 NOTE — Progress Notes (Signed)
[name], female   DOB: 1956-06-15, 63 y.o.   MRN: [id number] History of Present Illness:  April Knox is a 63 year old white female, PM, widowed in complaining of pain with sex and sinus pain with thick secretions, in throat, has been placed on xyzal and singular and Pepcid AC by PCP, and has appt with ENT in November.  PCP is Dr Sherrie Sport.  Current Medications, Allergies, Past Medical History, Past Surgical History, Family History and Social History were reviewed in [employer] record.     Review of Systems: Pain with sex Sinus pain with thick secretions     Physical Exam:BP 132/75 (BP Location: Left Arm, Patient Position: Sitting, Cuff Size: Normal)   Pulse 71   Ht 5\' 7"  (1.702 m)   Wt 121 lb 8 oz (55.1 kg)   BMI 19.03 kg/m  General:  Well developed, well nourished, no acute distress Skin:  Warm and dry Neck:  Midline trachea, normal thyroid, good ROM, no lymphadenopathy,throat is red, no pustules  And maxillary sinus is a little tender. Pelvic:  External genitalia is normal in appearance, no lesions.  The vagina has loss of color and rugae has estrogen cream present, has pain anterior vagina on exam.. Urethra has no lesions or masses. The cervix is smooth  Uterus is felt to be normal size, shape, and contour.  No adnexal masses or tenderness noted.Bladder is non tender, no masses felt. Psych:  No mood changes, alert and cooperative,seems happy Fall risk is low Examination chaperoned by April Knox. Pt was teary when talking about current sex partner, he is not as committed, Face time 15 minutes with 50% listening to pt talk.   Impression and plan: 1. Dyspareunia, female -continue estrogen cream -use good lubricate and change positions -talk with partner   2. Nasal sinus congestion -has Flonase but does not like to use it   3. Subacute maxillary sinusitis -will rx [facility] ordered this encounter  Medications  . azithromycin (ZITHROMAX)  250 MG tablet    Sig: Take 2 today then 1 daily for 4 days    Dispense:  6 each    Refill:  0    Order Specific Question:   Supervising Provider    Answer:   Tania Ade H [[id number]]  -continue xyzal,pepcid AC and singular - see ENT as scheduled Follow up prn

## 2019-06-09 DIAGNOSIS — J301 Allergic rhinitis due to pollen: Secondary | ICD-10-CM | POA: Diagnosis not present

## 2019-06-09 DIAGNOSIS — K219 Gastro-esophageal reflux disease without esophagitis: Secondary | ICD-10-CM | POA: Diagnosis not present

## 2019-06-09 DIAGNOSIS — I10 Essential (primary) hypertension: Secondary | ICD-10-CM | POA: Diagnosis not present

## 2019-06-13 ENCOUNTER — Telehealth (INDEPENDENT_AMBULATORY_CARE_PROVIDER_SITE_OTHER): Payer: Self-pay | Admitting: Nurse Practitioner

## 2019-06-13 NOTE — Telephone Encounter (Signed)
Patient called in progress report - states she is better but the acid reflux is not completely gon - ph# [phone]

## 2019-06-20 ENCOUNTER — Other Ambulatory Visit (INDEPENDENT_AMBULATORY_CARE_PROVIDER_SITE_OTHER): Payer: Self-pay | Admitting: Nurse Practitioner

## 2019-06-20 DIAGNOSIS — Z23 Encounter for immunization: Secondary | ICD-10-CM | POA: Diagnosis not present

## 2019-06-20 DIAGNOSIS — R0989 Other specified symptoms and signs involving the circulatory and respiratory systems: Secondary | ICD-10-CM

## 2019-06-20 DIAGNOSIS — K219 Gastro-esophageal reflux disease without esophagitis: Secondary | ICD-10-CM

## 2019-06-20 MED ORDER — OMEPRAZOLE 40 MG PO CPDR: 40.0000 mg | DELAYED_RELEASE_CAPSULE | Freq: Two times a day (BID) | ORAL | 2 refills | Status: DC

## 2019-06-20 NOTE — Telephone Encounter (Signed)
I called pt on her mobile phone.  She is having reflux symptoms twice daily which has improved compared to 5 episodes daily.  I advised that she stop the famotidine.  She will start omeprazole 40 mg 1 p.o. twice daily.  She may take famotidine as needed once or twice daily.  She has a evaluation with ENT in November and a follow-up appointment in our office a week or 2 after that consult.  She will call our office if her symptoms persist or worsen.

## 2019-06-20 NOTE — Telephone Encounter (Signed)
I called pt at the provided #, phone just rang, no answer. Pt has follow up office visit next month.

## 2019-06-23 ENCOUNTER — Telehealth: Payer: Self-pay | Admitting: *Deleted

## 2019-06-23 MED ORDER — FLUTICASONE PROPIONATE 50 MCG/ACT NA SUSP: 1.0000 | Freq: Every day | NASAL | 1 refills | Status: DC

## 2019-06-23 NOTE — Telephone Encounter (Signed)
Pt requests flonase will refill

## 2019-06-23 NOTE — Addendum Note (Signed)
Addended by: Derrek Monaco A on: 06/23/2019 02:12 PM   Modules accepted: Orders

## 2019-06-23 NOTE — Telephone Encounter (Signed)
Pt wanted to see if [name] would call in some nasal spray for her. She used fluticasone in the past and it worked well.

## 2019-06-27 ENCOUNTER — Other Ambulatory Visit: Payer: Self-pay | Admitting: Adult Health

## 2019-07-04 ENCOUNTER — Ambulatory Visit (INDEPENDENT_AMBULATORY_CARE_PROVIDER_SITE_OTHER): Payer: Medicare Other | Admitting: Otolaryngology

## 2019-07-04 DIAGNOSIS — J343 Hypertrophy of nasal turbinates: Secondary | ICD-10-CM | POA: Diagnosis not present

## 2019-07-04 DIAGNOSIS — J342 Deviated nasal septum: Secondary | ICD-10-CM

## 2019-07-04 DIAGNOSIS — R0982 Postnasal drip: Secondary | ICD-10-CM

## 2019-07-15 ENCOUNTER — Telehealth: Payer: Self-pay | Admitting: *Deleted

## 2019-07-15 NOTE — Telephone Encounter (Signed)
Pt left message that she would like for [name] to call her back to discuss changing a medication.

## 2019-07-15 NOTE — Telephone Encounter (Signed)
Has feeling of thick mucous clears throat, can try mucinex  if no help then Call GI, has seen Dr Benjamine Mola

## 2019-07-19 ENCOUNTER — Other Ambulatory Visit: Payer: Self-pay | Admitting: *Deleted

## 2019-07-21 ENCOUNTER — Other Ambulatory Visit: Payer: Self-pay

## 2019-07-21 ENCOUNTER — Ambulatory Visit (INDEPENDENT_AMBULATORY_CARE_PROVIDER_SITE_OTHER): Payer: Medicare Other | Admitting: Nurse Practitioner

## 2019-07-21 ENCOUNTER — Encounter (INDEPENDENT_AMBULATORY_CARE_PROVIDER_SITE_OTHER): Payer: Self-pay | Admitting: Nurse Practitioner

## 2019-07-21 VITALS — BP 151/76 | HR 79 | Temp 98.2°F | Ht 67.0 in | Wt 121.8 lb

## 2019-07-21 DIAGNOSIS — K219 Gastro-esophageal reflux disease without esophagitis: Secondary | ICD-10-CM

## 2019-07-21 NOTE — Progress Notes (Addendum)
   Subjective:    [name], female    DOB: [birth year], 63 y.o.   MRN: [id number]  HPI April Knox is a 63 year old female with a past medical history of anxiety, hypertension and PSVT.  She was last seen in the office 06/01/2019 with complaints of postnasal drainage, frequent throat clearing, lump sensation in her throat and constipation.  She was on Famotidine 20 mg twice daily for a few weeks without improvement.  She was switched to omeprazole 40 mg 1 p.o. twice daily in mid October and she started Flonase nasal spray.  She was evaluated by Dr. Benjamine Mola of  ENT, she states her exam was mostly normal except  evidence of mild reflux.  I have requested a copy of this consult report for my review.  Her throat clearing and globus sensation have completely resolved.  She has infrequent heartburn, last occurred 2 weeks ago.  No upper or lower abdominal pain.  Her constipation has improved with using Benefiber few days weekly.  She uses Linzess 72 mcg 1 tablet once every 2 weeks.  Her pressure is elevated in office today which has made her anxious.  She drinks decaffeinated coffee.  I advised her to follow-up with her primary care physician in the next few weeks to have her blood pressure rechecked.  Her stress level is elevated. She underwent a colonoscopy 06/25/2016 which identified a 3 mm tubular adenomatous polyp at the hepatic flexure and external hemorrhoids.  She was advised by Dr. Laural Golden to repeat a colonoscopy in 7 years.  Review of Systems See HPI, all other systems reviewed and are negative     Objective:   Physical Exam Blood pressure (!) 151/76, pulse 79, temperature 98.2 F (36.8 C), temperature source Oral, height 5\' 7"  (1.702 m), weight 121 lb 12.8 oz (55.2 kg). General: Thin 63 year old female no acute distress Eyes: Sclera nonicteric, conjunctiva pink Mouth: No posterior pharynx erythema Heart: Regular rate and rhythm, no murmurs Lungs: Breath sounds clear throughout  Abdomen: Soft, nontender, positive bowel sounds to all 4 quadrants, no HSM. Extremities: No edema Neuro: Alert and orient x4, no focal deficits    Assessment & Plan:   1.  Laryngeal reflux symptoms have resolved on Omeprazole 40 mg p.o. twice daily and Flonase Nasal spray. -Continue omeprazole 40 mg twice daily for the next 8 weeks.  Patient to call our office in 8 weeks for further Omeprazole instructions.  If tolerated, I would recommend reducing Omeprazole to once daily in 8 weeks. -Request copy of her ENT consultation with Dr. Benjamine Mola.  If she was found to have evidence of laryngeal reflux I would recommend an EGD in the near future.  Patient prefers to avoid any invasive procedure at this time.  She is anxious.  -She agrees to follow-up in our office in 3 months and further discussion regarding scheduling an EGD will be completed at that time  2.  Constipation improved on fiber, infrequent use of Linzess -Continue Benefiber as tolerated.  Linzess 72 mcg 1 capsule p.o. daily as needed  3. History of Colon polyps  -Next colonoscopy due October 2024  4. Anxiety

## 2019-07-21 NOTE — Patient Instructions (Addendum)
1.  Continue omeprazole 40 mg twice daily for the next 8 weeks.  2.  Follow-up in the office in 3 months, if you continue to have laryngeal reflux symptoms an EGD would be ordered at that time  3.  Follow-up with your primary care physician regarding your elevated blood pressure  4.  Call our office if your symptoms worsen

## 2019-07-22 ENCOUNTER — Telehealth (INDEPENDENT_AMBULATORY_CARE_PROVIDER_SITE_OTHER): Payer: Self-pay | Admitting: Nurse Practitioner

## 2019-07-22 NOTE — Telephone Encounter (Signed)
Patient called regarding notes from Dr Benjamine Mola - would like a call back at [phone]

## 2019-07-25 MED ORDER — ALPRAZOLAM 1 MG PO TABS: 1.0000 mg | ORAL_TABLET | Freq: Two times a day (BID) | ORAL | 0 refills | Status: DC | PRN

## 2019-07-25 NOTE — Telephone Encounter (Signed)
I called the patient, I discussed her ENT eval showed evidence of reflux. She will continue the prescribed reflux regimen, refer to her most recent office visit. I advised an EGD in the near future to assess for reflux, rule out Barrett's esophagus. She agreed to follow up in the office in 2 to 3 months with Dr. Laural Golden. She will call the office if her symptoms worsen.

## 2019-09-16 ENCOUNTER — Other Ambulatory Visit: Payer: Self-pay | Admitting: Adult Health

## 2019-10-21 ENCOUNTER — Other Ambulatory Visit: Payer: Self-pay | Admitting: Adult Health

## 2019-11-14 ENCOUNTER — Ambulatory Visit (INDEPENDENT_AMBULATORY_CARE_PROVIDER_SITE_OTHER): Payer: Medicare Other | Admitting: Internal Medicine

## 2019-11-14 ENCOUNTER — Encounter (INDEPENDENT_AMBULATORY_CARE_PROVIDER_SITE_OTHER): Payer: Self-pay | Admitting: Internal Medicine

## 2019-11-14 ENCOUNTER — Other Ambulatory Visit: Payer: Self-pay | Admitting: Adult Health

## 2019-11-14 ENCOUNTER — Other Ambulatory Visit: Payer: Self-pay

## 2019-11-14 VITALS — BP 144/87 | HR 76 | Temp 98.2°F | Ht 67.0 in | Wt 126.0 lb

## 2019-11-14 DIAGNOSIS — R140 Abdominal distension (gaseous): Secondary | ICD-10-CM | POA: Diagnosis not present

## 2019-11-14 DIAGNOSIS — R0989 Other specified symptoms and signs involving the circulatory and respiratory systems: Secondary | ICD-10-CM

## 2019-11-14 DIAGNOSIS — K5900 Constipation, unspecified: Secondary | ICD-10-CM | POA: Diagnosis not present

## 2019-11-14 DIAGNOSIS — K219 Gastro-esophageal reflux disease without esophagitis: Secondary | ICD-10-CM | POA: Diagnosis not present

## 2019-11-14 MED ORDER — SIMETHICONE 180 MG PO CAPS: 180.0000 mg | ORAL_CAPSULE | Freq: Two times a day (BID) | ORAL | 0 refills | Status: DC | PRN

## 2019-11-14 MED ORDER — OMEPRAZOLE 40 MG PO CPDR: 40.0000 mg | DELAYED_RELEASE_CAPSULE | Freq: Every day | ORAL | 5 refills | Status: DC

## 2019-11-14 MED ORDER — BENEFIBER DRINK MIX PO PACK: 4.0000 g | PACK | Freq: Every day | ORAL | Status: DC

## 2019-11-14 MED ORDER — POLYETHYLENE GLYCOL 3350 17 GM/SCOOP PO POWD: 8.5000 g | Freq: Every day | ORAL | 5 refills | Status: DC

## 2019-11-14 NOTE — Progress Notes (Signed)
Presenting complaint;  Follow for chronic GERD and constipation. Patient complains of bloating.  Database and subjective:  Patient is 64 year old Caucasian female who is here for scheduled visit.  She was last seen by Ms. Mliss Fritz, NP on 06/01/2019.  She was complaining of need to clear her throat often.  She felt as if she had lump in her throat.  She had tried famotidine and Flonase but did not see significant improvement.  She was not experiencing heartburn regurgitation cough or hoarseness.  She was switched to omeprazole 40 mg twice daily.  ENT evaluation was recommended.  She was seen by Dr. Benjamine Mola and felt to have GERD.  Patient states she did not hear back from Ms. Berniece Pap, NP.  After several weeks she drop omeprazole dose to 40 mg daily.  She feels some better.  She could not tell a big difference when she was taking 80 mg daily.  She feels she still has to clear her throat but not as often.  She denies good morning heartburn dysphagia nausea vomiting cough hoarseness.  She says she eats multiple snacks a day and rarely eats a meal.  She also complains of bloating.  She says sometimes she feels as if she has eaten too much watermelon.  She feels her food stays in stomach for long.  She remains with intermittent constipation.  Lately she has been passing bowels.  She denies melena or rectal bleeding.  She is using polyethylene glycol on as-needed basis.  She states she was supposed to get a prescription for Linzess/linaclotide but never received on. She also has been eating yogurt and Kefri which is a probiotic.  She is not sure if it is helping with bloating.  She says Dr.Hasanaj thought that she may have IBS but did not recommend any treatment.  She denies postprandial diarrhea or urgency. Last colonoscopy was in October 2017 with removal of 3 mm tubular adenoma.  She was also noted to have mos. she is due for follow-up exam in October 2024 which would be 7 years from the last  exam. Patient does not drink alcohol or smoke cigarettes.  She states her mother died of metastatic carcinoma at age 26 within few weeks of diagnosis.  It is unclear as to the primary.    Current Medications: Outpatient Encounter Medications as of 11/14/2019  Medication Sig  . acetaminophen (TYLENOL) 500 MG tablet Take 500-10,001 mg by mouth as needed (for pain).   Marland Kitchen ALPRAZolam (XANAX) 1 MG tablet TAKE ONE TABLET BY MOUTH TWICE DAILY AS NEEDED FOR ANXIETY.  Marland Kitchen estradiol (ESTRACE VAGINAL) 0.1 MG/GM vaginal cream Use 1/2 applicator at hs 2-3 x wekly  . hydrochlorothiazide (HYDRODIURIL) 25 MG tablet Take 25 mg by mouth daily.  Marland Kitchen levocetirizine (XYZAL) 5 MG tablet Take 5 mg by mouth daily. In the morning per the patient.  . Multiple Vitamin (MULTIVITAMIN) tablet Take 1 tablet by mouth daily.    Marland Kitchen omeprazole (PRILOSEC) 40 MG capsule Take 1 capsule (40 mg total) by mouth 2 (two) times daily. Take 1 capsule by mouth 30 minutes before breakfast and dinner  . OVER THE COUNTER MEDICATION Chewy Zymes - Patient states that she takes 2 by mouth daily.  . Polyethylene Glycol 3350 (MIRALAX PO) Take 17 g by mouth as needed.  . Famotidine (PEPCID AC PO) Take by mouth 2 (two) times daily.  . fluticasone (FLONASE) 50 MCG/ACT nasal spray Place 1 spray into both nostrils daily. (Patient not taking: Reported on 11/14/2019)  .  linaclotide (LINZESS) 72 MCG capsule Take 1 capsule (72 mcg total) by mouth daily before breakfast. (Patient not taking: Reported on 11/14/2019)  . montelukast (SINGULAIR) 10 MG tablet Take 10 mg by mouth daily. Per the patient she takes in the morning with the allergy pill, Levoceitrizine  . OVER THE COUNTER MEDICATION Plus CBD Oil - Hemp Gummies ----- Patient states that she takes twice a day as needed.   No facility-administered encounter medications on file as of 11/14/2019.     Objective: Blood pressure (!) 144/87, pulse 76, temperature 98.2 F (36.8 C), temperature source Temporal,  height 5\' 7"  (1.702 m), weight 126 lb (57.2 kg). Patient is alert and in no acute distress. She is wearing a facial mask. Conjunctiva is pink. Sclera is nonicteric Oropharyngeal mucosa is normal. No neck masses or thyromegaly noted. Cardiac exam with regular rhythm normal S1 and S2. No murmur or gallop noted. Lungs are clear to auscultation. Abdomen is flat.  Bowel sounds are normal.  On palpation abdomen is soft and nontender without organomegaly or masses. No LE edema or clubbing noted.  Assessment:  #1.  Chronic GERD.  She remains with throat symptoms but does not have other typical symptoms such as heartburn or regurgitation.  She is back on single dose of PPI.  She could not tell any difference while on 80 mg of omeprazole per day.  Control symptoms could be due to more than one etiologies.  She would benefit from diagnostic esophagogastroduodenoscopy  #2.  Bloating.  She does not have other worrisome symptoms such as weight loss nausea vomiting or diarrhea.  Will consider duodenal biopsy at the time of EGD.  #3.  Chronic constipation.  Linzess/linaclotide was recommended but she apparently did not receive prescription.  She is to try polyethylene glycol on schedule before therapy change.  She is up-to-date on CRC screening.  Plan:  Med list updated.  Patient advised to take polyethylene glycol 8.5 g by mouth daily.  She can titrate dose up or down as needed. Benefiber 4 g by mouth daily at bedtime.   Phazyme 180 mg by mouth 3 times daily as needed for bloating. Diagnostic esophagogastroduodenoscopy with small bowel biopsy. Office visit in 4 months.

## 2019-11-14 NOTE — Patient Instructions (Signed)
Esophagogastroduodenoscopy to be scheduled.  

## 2019-11-15 ENCOUNTER — Telehealth (INDEPENDENT_AMBULATORY_CARE_PROVIDER_SITE_OTHER): Payer: Self-pay | Admitting: Internal Medicine

## 2019-11-17 ENCOUNTER — Encounter (INDEPENDENT_AMBULATORY_CARE_PROVIDER_SITE_OTHER): Payer: Self-pay | Admitting: *Deleted

## 2019-11-17 ENCOUNTER — Other Ambulatory Visit (INDEPENDENT_AMBULATORY_CARE_PROVIDER_SITE_OTHER): Payer: Self-pay | Admitting: *Deleted

## 2019-11-17 ENCOUNTER — Telehealth (INDEPENDENT_AMBULATORY_CARE_PROVIDER_SITE_OTHER): Payer: Self-pay | Admitting: *Deleted

## 2019-11-17 DIAGNOSIS — R140 Abdominal distension (gaseous): Secondary | ICD-10-CM

## 2019-11-17 DIAGNOSIS — K219 Gastro-esophageal reflux disease without esophagitis: Secondary | ICD-10-CM

## 2019-11-17 NOTE — Telephone Encounter (Signed)
EGD w/ biopsy sch'd 12/16/19, patient aware, instructions mailed

## 2019-11-17 NOTE — Telephone Encounter (Signed)
Patient has called the office multiple times since her office visit earlier this week. Numerous questions. Patient does not want to have procedure at this time as she is  Having her CoVid 10 Vaccination on the 20 th something this month. She has concerns about the shot and then what is in the medication that will be used for sedation at the procedure.  Per Dr.Rehman the patient may have the blood test for Celiac Disease, she may cancel the procedure if she wants. She does not need to have a Celiac Free Diet.  Patient called and she wants to have CoVid Vaccination first then the EGD/BX. Patient was advised that this would be mid April. She was okay with this.

## 2019-11-29 ENCOUNTER — Other Ambulatory Visit: Payer: Self-pay | Admitting: Adult Health

## 2019-12-12 ENCOUNTER — Telehealth: Payer: Self-pay | Admitting: Adult Health

## 2019-12-12 DIAGNOSIS — F419 Anxiety disorder, unspecified: Secondary | ICD-10-CM

## 2019-12-12 NOTE — Addendum Note (Signed)
Addended by: Derrek Monaco A on: 12/12/2019 03:09 PM   Modules accepted: Orders

## 2019-12-12 NOTE — Telephone Encounter (Signed)
Pt has had COVID vaccine x 1, says had fever,headache and elbow hurt, having endoscopy Friday with Dr Laural Golden , and she is nervous, she is thinking she wants to talk to someone, will refer to [facility]

## 2019-12-12 NOTE — Patient Instructions (Signed)
RIANA COLLINSON  12/12/2019     @PREFPERIOPPHARMACY @   Your procedure is scheduled on  12/16/2019 .  Report to [facility] at  0700  A.M.  Call this number if you have problems the morning of surgery:  [phone]   Remember:  Follow the diet instructions given to you by Dr Olevia Perches office.                     Take these medicines the morning of surgery with A SIP OF WATER  Xanax(if needed), xyzal, omeprazole.    Do not wear jewelry, make-up or nail polish.  Do not wear lotions, powders, or perfumes. Please wear deodorant and brush your teeth.  Do not shave 48 hours prior to surgery.  Men may shave face and neck.  Do not bring valuables to the hospital.  [facility] is not responsible for any belongings or valuables.  Contacts, dentures or bridgework may not be worn into surgery.  Leave your suitcase in the car.  After surgery it may be brought to your room.  For patients admitted to the hospital, discharge time will be determined by your treatment team.  Patients discharged the day of surgery will not be allowed to drive home.   Name and phone number of your driver:   family Special instructions:  DO NOT smoke the morning of your procedure.  Please read over the following fact sheets that you were given. Anesthesia Post-op Instructions and Care and Recovery After Surgery       Upper Endoscopy, Adult, Care After This sheet gives you information about how to care for yourself after your procedure. Your health care provider may also give you more specific instructions. If you have problems or questions, contact your health care provider. What can I expect after the procedure? After the procedure, it is common to have:  A sore throat.  Mild stomach pain or discomfort.  Bloating.  Nausea. Follow these instructions at home:   Follow instructions from your health care provider about what to eat or drink after your procedure.  Return to your normal activities  as told by your health care provider. Ask your health care provider what activities are safe for you.  Take over-the-counter and prescription medicines only as told by your health care provider.  Do not drive for 24 hours if you were given a sedative during your procedure.  Keep all follow-up visits as told by your health care provider. This is important. Contact a health care provider if you have:  A sore throat that lasts longer than one day.  Trouble swallowing. Get help right away if:  You vomit blood or your vomit looks like coffee grounds.  You have: ? A fever. ? Bloody, black, or tarry stools. ? A severe sore throat or you cannot swallow. ? Difficulty breathing. ? Severe pain in your chest or abdomen. Summary  After the procedure, it is common to have a sore throat, mild stomach discomfort, bloating, and nausea.  Do not drive for 24 hours if you were given a sedative during the procedure.  Follow instructions from your health care provider about what to eat or drink after your procedure.  Return to your normal activities as told by your health care provider. This information is not intended to replace advice given to you by your health care provider. Make sure you discuss any questions you have with your health care provider. Document Revised: 02/09/2018  Document Reviewed: 01/18/2018 Elsevier Patient Education  [place] After These instructions provide you with information about caring for yourself after your procedure. Your health care provider may also give you more specific instructions. Your treatment has been planned according to current medical practices, but problems sometimes occur. Call your health care provider if you have any problems or questions after your procedure. What can I expect after the procedure? After your procedure, you may:  Feel sleepy for several hours.  Feel clumsy and have poor balance for several  hours.  Feel forgetful about what happened after the procedure.  Have poor judgment for several hours.  Feel nauseous or vomit.  Have a sore throat if you had a breathing tube during the procedure. Follow these instructions at home: For at least 24 hours after the procedure:      Have a responsible adult stay with you. It is important to have someone help care for you until you are awake and alert.  Rest as needed.  Do not: ? Participate in activities in which you could fall or become injured. ? Drive. ? Use heavy machinery. ? Drink alcohol. ? Take sleeping pills or medicines that cause drowsiness. ? Make important decisions or sign legal documents. ? Take care of children on your own. Eating and drinking  Follow the diet that is recommended by your health care provider.  If you vomit, drink water, juice, or soup when you can drink without vomiting.  Make sure you have little or no nausea before eating solid foods. General instructions  Take over-the-counter and prescription medicines only as told by your health care provider.  If you have sleep apnea, surgery and certain medicines can increase your risk for breathing problems. Follow instructions from your health care provider about wearing your sleep device: ? Anytime you are sleeping, including during daytime naps. ? While taking prescription pain medicines, sleeping medicines, or medicines that make you drowsy.  If you smoke, do not smoke without supervision.  Keep all follow-up visits as told by your health care provider. This is important. Contact a health care provider if:  You keep feeling nauseous or you keep vomiting.  You feel light-headed.  You develop a rash.  You have a fever. Get help right away if:  You have trouble breathing. Summary  For several hours after your procedure, you may feel sleepy and have poor judgment.  Have a responsible adult stay with you for at least 24 hours or until  you are awake and alert. This information is not intended to replace advice given to you by your health care provider. Make sure you discuss any questions you have with your health care provider. Document Revised: 11/16/2017 Document Reviewed: 12/09/2015 Elsevier Patient Education  [place].

## 2019-12-12 NOTE — Telephone Encounter (Signed)
Patient called, stated she is having an endoscopy Friday.  She is requesting to speak to [facility].  [phone]

## 2019-12-13 ENCOUNTER — Other Ambulatory Visit (HOSPITAL_COMMUNITY)
Admission: RE | Admit: 2019-12-13 | Discharge: 2019-12-13 | Disposition: A | Discharge status: Home / Self Care | Payer: Medicare Other | Source: Ambulatory Visit | Attending: Internal Medicine | Admitting: Internal Medicine

## 2019-12-13 ENCOUNTER — Encounter (HOSPITAL_COMMUNITY)
Admission: RE | Admit: 2019-12-13 | Discharge: 2019-12-13 | Disposition: A | Discharge status: Home / Self Care | Payer: Medicare Other | Source: Ambulatory Visit | Attending: Internal Medicine | Admitting: Internal Medicine

## 2019-12-13 ENCOUNTER — Other Ambulatory Visit: Payer: Self-pay

## 2019-12-13 ENCOUNTER — Encounter (HOSPITAL_COMMUNITY): Payer: Self-pay

## 2019-12-13 DIAGNOSIS — Z20822 Contact with and (suspected) exposure to covid-19: Secondary | ICD-10-CM | POA: Diagnosis not present

## 2019-12-13 DIAGNOSIS — Z01812 Encounter for preprocedural laboratory examination: Secondary | ICD-10-CM | POA: Diagnosis not present

## 2019-12-13 DIAGNOSIS — R140 Abdominal distension (gaseous): Secondary | ICD-10-CM

## 2019-12-13 DIAGNOSIS — K219 Gastro-esophageal reflux disease without esophagitis: Secondary | ICD-10-CM

## 2019-12-13 LAB — BASIC METABOLIC PANEL
Anion gap: 10 (ref 5–15)
BUN: 16 mg/dL (ref 8–23)
CO2: 31 mmol/L (ref 22–32)
Calcium: 9.3 mg/dL (ref 8.9–10.3)
Chloride: 100 mmol/L (ref 98–111)
Creatinine, Ser: 0.61 mg/dL (ref 0.44–1.00)
GFR calc Af Amer: >60 mL/min (ref >60)
GFR calc non Af Amer: >60 mL/min (ref >60)
Glucose, Bld: 85 mg/dL (ref 70–99)
Potassium: 3.5 mmol/L (ref 3.5–5.1)
Sodium: 141 mmol/L (ref 135–145)

## 2019-12-14 LAB — TISSUE TRANSGLUTAMINASE, IGA: Tissue Transglutaminase Ab, IgA: <2 U/mL (ref 0–3)

## 2019-12-14 LAB — GLIADIN ANTIBODIES, SERUM
Antigliadin Abs, IgA: 9 units (ref 0–19)
Gliadin IgG: 2 units (ref 0–19)

## 2019-12-14 LAB — SARS CORONAVIRUS 2 (TAT 6-24 HRS): SARS Coronavirus 2: NEGATIVE

## 2019-12-15 ENCOUNTER — Other Ambulatory Visit: Payer: Self-pay | Admitting: Adult Health

## 2019-12-15 LAB — RETICULIN ANTIBODIES, IGA W TITER: Reticulin Ab, IgA: NEGATIVE titer (ref <2.5)

## 2019-12-16 ENCOUNTER — Encounter (HOSPITAL_COMMUNITY)
Admission: RE | Disposition: A | Discharge status: Home / Self Care | Payer: Self-pay | Source: Home / Self Care | Attending: Internal Medicine

## 2019-12-16 ENCOUNTER — Encounter (HOSPITAL_COMMUNITY): Payer: Self-pay | Admitting: Internal Medicine

## 2019-12-16 ENCOUNTER — Ambulatory Visit (HOSPITAL_COMMUNITY): Payer: Medicare Other | Admitting: Anesthesiology

## 2019-12-16 ENCOUNTER — Other Ambulatory Visit: Payer: Self-pay

## 2019-12-16 ENCOUNTER — Ambulatory Visit (HOSPITAL_COMMUNITY)
Admission: RE | Admit: 2019-12-16 | Discharge: 2019-12-16 | Disposition: A | Discharge status: Home / Self Care | Payer: Medicare Other | Attending: Internal Medicine | Admitting: Internal Medicine

## 2019-12-16 DIAGNOSIS — Z881 Allergy status to other antibiotic agents status: Secondary | ICD-10-CM | POA: Insufficient documentation

## 2019-12-16 DIAGNOSIS — F419 Anxiety disorder, unspecified: Secondary | ICD-10-CM | POA: Diagnosis not present

## 2019-12-16 DIAGNOSIS — Z882 Allergy status to sulfonamides status: Secondary | ICD-10-CM | POA: Diagnosis not present

## 2019-12-16 DIAGNOSIS — M8580 Other specified disorders of bone density and structure, unspecified site: Secondary | ICD-10-CM | POA: Diagnosis not present

## 2019-12-16 DIAGNOSIS — K219 Gastro-esophageal reflux disease without esophagitis: Secondary | ICD-10-CM

## 2019-12-16 DIAGNOSIS — R140 Abdominal distension (gaseous): Secondary | ICD-10-CM | POA: Insufficient documentation

## 2019-12-16 DIAGNOSIS — K317 Polyp of stomach and duodenum: Secondary | ICD-10-CM

## 2019-12-16 DIAGNOSIS — I471 Supraventricular tachycardia: Secondary | ICD-10-CM | POA: Insufficient documentation

## 2019-12-16 DIAGNOSIS — I10 Essential (primary) hypertension: Secondary | ICD-10-CM | POA: Diagnosis not present

## 2019-12-16 DIAGNOSIS — Z79899 Other long term (current) drug therapy: Secondary | ICD-10-CM | POA: Insufficient documentation

## 2019-12-16 DIAGNOSIS — E7800 Pure hypercholesterolemia, unspecified: Secondary | ICD-10-CM | POA: Diagnosis not present

## 2019-12-16 DIAGNOSIS — Z880 Allergy status to penicillin: Secondary | ICD-10-CM | POA: Insufficient documentation

## 2019-12-16 DIAGNOSIS — Z885 Allergy status to narcotic agent status: Secondary | ICD-10-CM | POA: Insufficient documentation

## 2019-12-16 HISTORY — PX: BIOPSY: SHX5522

## 2019-12-16 HISTORY — PX: ESOPHAGOGASTRODUODENOSCOPY (EGD) WITH PROPOFOL: SHX5813

## 2019-12-16 SURGERY — ESOPHAGOGASTRODUODENOSCOPY (EGD) WITH PROPOFOL
Anesthesia: General

## 2019-12-16 MED ORDER — PROPOFOL 10 MG/ML IV BOLUS
INTRAVENOUS | Status: AC
  Filled 2019-12-16: qty 40

## 2019-12-16 MED ORDER — MIDAZOLAM HCL 2 MG/2ML IJ SOLN
INTRAMUSCULAR | Status: AC
  Filled 2019-12-16: qty 2

## 2019-12-16 MED ORDER — PROPOFOL 500 MG/50ML IV EMUL
INTRAVENOUS | Status: DC | PRN
  Administered 2019-12-16: 150 ug/kg/min via INTRAVENOUS

## 2019-12-16 MED ORDER — LIDOCAINE HCL URETHRAL/MUCOSAL 2 % EX GEL
CUTANEOUS | Status: DC | PRN
  Administered 2019-12-16: 1 via TOPICAL

## 2019-12-16 MED ORDER — MIDAZOLAM HCL 5 MG/5ML IJ SOLN
INTRAMUSCULAR | Status: DC | PRN
  Administered 2019-12-16: 2 mg via INTRAVENOUS

## 2019-12-16 MED ORDER — CHLORHEXIDINE GLUCONATE CLOTH 2 % EX PADS: 6.0000 | MEDICATED_PAD | Freq: Once | CUTANEOUS | Status: DC

## 2019-12-16 MED ORDER — LACTATED RINGERS IV SOLN: INTRAVENOUS | Status: DC | PRN

## 2019-12-16 MED ORDER — LACTATED RINGERS IV SOLN: Freq: Once | INTRAVENOUS | Status: AC

## 2019-12-16 MED ORDER — LIDOCAINE VISCOUS HCL 2 % MT SOLN
OROMUCOSAL | Status: AC
  Filled 2019-12-16: qty 15

## 2019-12-16 NOTE — Anesthesia Preprocedure Evaluation (Addendum)
Anesthesia Evaluation  Patient identified by MRN, date of birth, ID band Patient awake    Reviewed: Allergy & Precautions, NPO status , Patient's Chart, lab work & pertinent test results  Airway Mallampati: I  TM Distance: >3 FB Neck ROM: Full    Dental  (+) Caps   Pulmonary neg pulmonary ROS,    Pulmonary exam normal breath sounds clear to auscultation       Cardiovascular Exercise Tolerance: Good hypertension, Pt. on medications Normal cardiovascular exam+ dysrhythmias Supra Ventricular Tachycardia + Valvular Problems/Murmurs MVP  Rhythm:Regular Rate:Normal  Study Conclusions   - Stress ECG conclusions: There were no stress arrhythmias or   conduction abnormalities. The stress ECG was negative for  ischemia.  - Staged echo: There was no echocardiographic evidence for stress-induced ischemia    Neuro/Psych  Headaches, PSYCHIATRIC DISORDERS Anxiety    GI/Hepatic Neg liver ROS, GERD (denies GERD, C/O ABDOMINAL PAIN)  Medicated,  Endo/Other  negative endocrine ROS  Renal/GU negative Renal ROS  negative genitourinary   Musculoskeletal Cervical spine disease    Abdominal   Peds  Hematology negative hematology ROS (+)   Anesthesia Other Findings   Reproductive/Obstetrics negative OB ROS                           Anesthesia Physical Anesthesia Plan  ASA: II  Anesthesia Plan: General   Post-op Pain Management:    Induction: Intravenous  PONV Risk Score and Plan:   Airway Management Planned: Nasal Cannula and Natural Airway  Additional Equipment:   Intra-op Plan:   Post-operative Plan:   Informed Consent: I have reviewed the patients History and Physical, chart, labs and discussed the procedure including the risks, benefits and alternatives for the proposed anesthesia with the patient or authorized representative who has indicated his/her understanding and acceptance.      Dental advisory given  Plan Discussed with: CRNA  Anesthesia Plan Comments:         Anesthesia Quick Evaluation

## 2019-12-16 NOTE — Discharge Instructions (Signed)
Resume usual medications and diet as before. No driving for 24 hours. Office visit in 6 months.  Upper Endoscopy, Adult, Care After This sheet gives you information about how to care for yourself after your procedure. Your health care provider may also give you more specific instructions. If you have problems or questions, contact your health care provider. What can I expect after the procedure? After the procedure, it is common to have:  A sore throat.  Mild stomach pain or discomfort.  Bloating.  Nausea. Follow these instructions at home:   Follow instructions from your health care provider about what to eat or drink after your procedure.  Return to your normal activities as told by your health care provider. Ask your health care provider what activities are safe for you.  Take over-the-counter and prescription medicines only as told by your health care provider.  Do not drive for 24 hours if you were given a sedative during your procedure.  Keep all follow-up visits as told by your health care provider. This is important. Contact a health care provider if you have:  A sore throat that lasts longer than one day.  Trouble swallowing. Get help right away if:  You vomit blood or your vomit looks like coffee grounds.  You have: ? A fever. ? Bloody, black, or tarry stools. ? A severe sore throat or you cannot swallow. ? Difficulty breathing. ? Severe pain in your chest or abdomen. Summary  After the procedure, it is common to have a sore throat, mild stomach discomfort, bloating, and nausea.  Do not drive for 24 hours if you were given a sedative during the procedure.  Follow instructions from your health care provider about what to eat or drink after your procedure.  Return to your normal activities as told by your health care provider. This information is not intended to replace advice given to you by your health care provider. Make sure you discuss any questions  you have with your health care provider. Document Revised: 02/09/2018 Document Reviewed: 01/18/2018 Elsevier Patient Education  [place].

## 2019-12-16 NOTE — Op Note (Addendum)
[facility] Patient Name: April Knox Procedure Date: 12/16/2019 8:39 AM MRN: [id number] Date of Birth: 1956/06/29 Attending MD: Hildred Laser , MD CSN: [id number] Age: 64 Admit Type: Outpatient Procedure:                Upper GI endoscopy Indications:              Gastro-esophageal reflux disease, Follow-up of                            gastro-esophageal reflux disease, Abdominal bloating Providers:                Hildred Laser, MD, Otis Peak B. Sharon Seller, RN, Raphael Gibney, Technician Referring MD:             Stoney Bang MD, MD Medicines:                Propofol per Anesthesia Complications:            No immediate complications. Estimated Blood Loss:     Estimated blood loss: none. Procedure:                Pre-Anesthesia Assessment:                           - Prior to the procedure, a History and Physical                            was performed, and patient medications and                            allergies were reviewed. The patient's tolerance of                            previous anesthesia was also reviewed. The risks                            and benefits of the procedure and the sedation                            options and risks were discussed with the patient.                            All questions were answered, and informed consent                            was obtained. Prior Anticoagulants: The patient has                            taken no previous anticoagulant or antiplatelet                            agents. ASA Grade Assessment: II - A patient with  mild systemic disease. After reviewing the risks                            and benefits, the patient was deemed in                            satisfactory condition to undergo the procedure.                           After obtaining informed consent, the endoscope was                            passed under direct vision. Throughout the                    procedure, the patient's blood pressure, pulse, and                            oxygen saturations were monitored continuously. The                            [id number]) scope was introduced through the                            mouth, and advanced to the second part of duodenum.                            The upper GI endoscopy was accomplished without                            difficulty. The patient tolerated the procedure                            well. Findings:      The hypopharynx was normal.      The examined esophagus was normal.      The Z-line was regular and was found 41 cm from the incisors.      A few small sessile polyps with no stigmata of recent bleeding were       found in the gastric body.      The exam of the stomach was otherwise normal.      The duodenal bulb and second portion of the duodenum were normal. Impression:               - Normal hypopharynx.                           - Normal esophagus.                           - Z-line regular, 41 cm from the incisors.                           - A few gastric polyps. Stable since last exam;  therefore not biopsied                           - Normal duodenal bulb and second portion of the                            duodenum.                           - No specimens collected. Moderate Sedation:      Per Anesthesia Care Recommendation:           - Patient has a contact number available for                            emergencies. The signs and symptoms of potential                            delayed complications were discussed with the                            patient. Return to normal activities tomorrow.                            Written discharge instructions were provided to the                            patient.                           - Resume previous diet today.                           - Continue present medications.                           -  Return to GI clinic in 6 weeks. Procedure Code(s):        --- Professional ---                           [phone], Esophagogastroduodenoscopy, flexible,                            transoral; diagnostic, including collection of                            specimen(s) by brushing or washing, when performed                            (separate procedure) Diagnosis Code(s):        --- Professional ---                           K31.7, Polyp of stomach and duodenum                           K21.9, Gastro-esophageal reflux disease without  esophagitis                           R14.0, Abdominal distension (gaseous) CPT copyright 2019 American Medical Association. All rights reserved. The codes documented in this report are preliminary and upon coder review may  be revised to meet current compliance requirements. Hildred Laser, MD Hildred Laser, MD 12/16/2019 8:43:56 AM This report has been signed electronically. Number of Addenda: 0

## 2019-12-16 NOTE — Transfer of Care (Signed)
Immediate Anesthesia Transfer of Care Note  Patient: April Knox  Procedure(s) Performed: ESOPHAGOGASTRODUODENOSCOPY (EGD) WITH PROPOFOL (N/A ) BIOPSY (N/A )  Patient Location: PACU  Anesthesia Type:General  Level of Consciousness: awake, alert , oriented and patient cooperative  Airway & Oxygen Therapy: Patient Spontanous Breathing and Patient connected to nasal cannula oxygen  Post-op Assessment: Report given to RN and Post -op Vital signs reviewed and stable  Post vital signs: Reviewed and stable  Last Vitals:  Vitals Value Taken Time  BP    Temp    Pulse    Resp    SpO2      Last Pain:  Vitals:   12/16/19 0822  TempSrc:   PainSc: 0-No pain         Complications: No apparent anesthesia complications

## 2019-12-16 NOTE — H&P (Signed)
April Knox is an 64 y.o. female.   Chief Complaint: Patient is here for esophagogastroduodenoscopy. HPI: Patient is 64 year old Caucasian female with several year history of GERD who has been seen twice in the office in last 6 months.  She has had persistent throat symptoms.  PPI dose was doubled without unequivocal benefit.  She is presently on omeprazole 40 mg a day.  She says throat symptoms of just about resolved.  She says bloating has improved.  She denies heartburn nausea vomiting melena or rectal bleeding.  She does not drink alcohol or smoke cigarettes. Celiac disease panel was negative. Last EGD was in September 2010 revealing few gastric polyps.  Biopsy revealed hyperplastic polyp. She is undergoing diagnostic esophagogastroduodenoscopy.  Past Medical History:  Diagnosis Date  . Abnormal Pap smear   . Abnormal Papanicolaou smear of cervix with positive human papilloma virus (HPV) test 04/06/2018   Pap was LSIL with +HPV, will need colpo____  . Anxiety   . Back pain 03/07/2014  . Cervical spine disease    Mild  . Chest pain   . Elevated cholesterol   . Ganglion cyst of left foot 12/06/2013  . H/O bilateral breast implants 03/03/2013  . Hematuria 03/28/2014  . Hot flashes 12/06/2013  . Hypertension   . Menopausal symptoms 12/06/2013  . Mental disorder    anxiety  . Migraine with visual aura   . Osteopenia after menopause 10/06/2018   DEXA 09/28/2018, osteopenia, take calcium vitamin D and stay active   . PSVT (paroxysmal supraventricular tachycardia) ([place])    Documented by event recorder  . Rectocele 03/03/2013  . RUQ pain 03/07/2014  . Vaginal discharge 02/24/2014  . Vaginal dryness, menopausal 12/06/2013  . Vaginal Pap smear, abnormal   . Yeast infection 02/24/2014    Past Surgical History:  Procedure Laterality Date  . BREAST ENHANCEMENT SURGERY    . COLONOSCOPY N/A 06/25/2016   Procedure: COLONOSCOPY;  Surgeon: Rogene Houston, MD;  Location: [facility];  Service: Endoscopy;   Laterality: N/A;  1:00-moved to [facility] to notify pt  . POLYPECTOMY  06/25/2016   Procedure: POLYPECTOMY;  Surgeon: Rogene Houston, MD;  Location: [facility];  Service: Endoscopy;;  colon   . toe biopsy      Family History  Problem Relation Age of Onset  . Dementia Father   . Cancer Mother   . Healthy Daughter   . Healthy Daughter   . Healthy Daughter   . Cancer Maternal Grandmother   . Heart disease Maternal Grandmother   . Diabetes Maternal Grandfather   . Heart attack Paternal Grandmother    Social History:  reports that she has never smoked. She has never used smokeless tobacco. She reports that she does not drink alcohol or use drugs.  Allergies:  Allergies  Allergen Reactions  . Fish Allergy Anaphylaxis and Swelling    Throat closes.  . Bactrim Ds [Sulfamethoxazole-Trimethoprim]     Hives   . Ciprocinonide [Fluocinolone] Hives    " made me feel awful"   . Codeine Other (See Comments)    DROPS PT. BLOOD PRESSURE  . Penicillins     Has patient had a PCN reaction causing immediate rash, facial/tongue/throat swelling, SOB or lightheadedness with hypotension:unsure Has patient had a PCN reaction causing severe rash involving mucus membranes or skin necrosis:unsure Has patient had a PCN reaction that required hospitalization:No Has patient had a PCN reaction occurring within the last 10 years:No If all of the above answers  are "NO", then may proceed with Cephalosporin use. Childhood reaction   . Sulfa Antibiotics Hives, Diarrhea, Nausea Only and Rash    Medications Prior to Admission  Medication Sig Dispense Refill  . acetaminophen (TYLENOL) 500 MG tablet Take 500 mg by mouth every 6 (six) hours as needed (for pain).     Marland Kitchen ALPRAZolam (XANAX) 1 MG tablet TAKE ONE TABLET BY MOUTH TWICE DAILY AS NEEDED FOR ANXIETY. 60 tablet 0  . cholecalciferol (VITAMIN D3) 25 MCG (1000 UNIT) tablet Take 1,000 Units by mouth daily.    Marland Kitchen estradiol (ESTRACE VAGINAL) 0.1 MG/GM  vaginal cream Use 1/2 applicator at hs 2-3 x wekly 42.5 g 3  . hydrochlorothiazide (HYDRODIURIL) 25 MG tablet Take 25 mg by mouth daily.    Marland Kitchen levocetirizine (XYZAL) 5 MG tablet Take 5 mg by mouth daily. In the morning per the patient.    . Multiple Vitamin (MULTIVITAMIN) tablet Take 1 tablet by mouth daily.      Marland Kitchen omeprazole (PRILOSEC) 40 MG capsule Take 1 capsule (40 mg total) by mouth daily before breakfast. Take 1 capsule by mouth 30 minutes before breakfast and dinner 30 capsule 5  . polyethylene glycol powder (MIRALAX) 17 GM/SCOOP powder Take 8.5 g by mouth daily. 255 g 5  . Simethicone (PHAZYME) 180 MG CAPS Take 1 capsule (180 mg total) by mouth 2 (two) times daily as needed.  0  . Wheat Dextrin (BENEFIBER DRINK MIX) PACK Take 4 g by mouth at bedtime.    Marland Kitchen zinc gluconate 50 MG tablet Take 50 mg by mouth daily.      No results found for this or any previous visit (from the past 48 hour(s)). No results found.  Review of Systems  Blood pressure (!) 161/85, pulse 79, temperature 97.6 F (36.4 C), temperature source Oral, resp. rate 16, height 5\' 7"  (1.702 m), weight 56.2 kg, SpO2 98 %. Physical Exam  Constitutional:  Well-developed thin Caucasian female in NAD.  Eyes: Conjunctivae are normal. No scleral icterus.  Neck: No thyromegaly present.  Cardiovascular: Normal rate, regular rhythm and normal heart sounds.  No murmur heard. Respiratory: Effort normal and breath sounds normal.  GI: Soft. She exhibits no distension and no mass. There is no abdominal tenderness.  Lymphadenopathy:    She has no cervical adenopathy.  Neurological: She is alert.  Skin: Skin is warm and dry.     Assessment/Plan Chronic GERD and bloating. Diagnostic esophagogastroduodenoscopy.  Hildred Laser, MD 12/16/2019, 8:13 AM

## 2019-12-16 NOTE — Anesthesia Postprocedure Evaluation (Signed)
Anesthesia Post Note  Patient: April Knox  Procedure(s) Performed: ESOPHAGOGASTRODUODENOSCOPY (EGD) WITH PROPOFOL (N/A ) BIOPSY (N/A )  Patient location during evaluation: PACU Anesthesia Type: General Level of consciousness: awake, oriented, awake and alert and patient cooperative Pain management: pain level controlled Vital Signs Assessment: post-procedure vital signs reviewed and stable Respiratory status: spontaneous breathing, respiratory function stable and nonlabored ventilation Postop Assessment: no apparent nausea or vomiting Anesthetic complications: no     Last Vitals:  Vitals:   12/16/19 0724  BP: (!) 161/85  Pulse: 79  Resp: 16  Temp: 36.4 C  SpO2: 98%    Last Pain:  Vitals:   12/16/19 0822  TempSrc:   PainSc: 0-No pain                 Curley Hogen

## 2019-12-20 ENCOUNTER — Encounter (INDEPENDENT_AMBULATORY_CARE_PROVIDER_SITE_OTHER): Payer: Self-pay | Admitting: Internal Medicine

## 2019-12-27 ENCOUNTER — Other Ambulatory Visit: Payer: Self-pay | Admitting: Adult Health

## 2020-01-13 ENCOUNTER — Other Ambulatory Visit: Payer: Self-pay | Admitting: Adult Health

## 2020-02-02 ENCOUNTER — Ambulatory Visit (INDEPENDENT_AMBULATORY_CARE_PROVIDER_SITE_OTHER): Payer: Medicare Other | Admitting: Internal Medicine

## 2020-02-02 ENCOUNTER — Encounter (INDEPENDENT_AMBULATORY_CARE_PROVIDER_SITE_OTHER): Payer: Self-pay | Admitting: Internal Medicine

## 2020-02-02 ENCOUNTER — Other Ambulatory Visit: Payer: Self-pay

## 2020-02-02 VITALS — BP 138/88 | HR 74 | Temp 97.3°F | Ht 67.0 in | Wt 122.0 lb

## 2020-02-02 DIAGNOSIS — K219 Gastro-esophageal reflux disease without esophagitis: Secondary | ICD-10-CM

## 2020-02-02 MED ORDER — OMEPRAZOLE 20 MG PO CPDR: 20.0000 mg | DELAYED_RELEASE_CAPSULE | Freq: Two times a day (BID) | ORAL | 5 refills | Status: DC

## 2020-02-02 NOTE — Progress Notes (Signed)
Presenting complaint;  Follow-up for chronic GERD bloating and constipation.  Database and subjective:  Patient is 64 year old Caucasian female with chronic GERD who underwent EGD on 12/16/2019 and was negative for erosive reflux esophagitis or Barrett's esophagus.  She has been maintained on omeprazole 40 mg daily.  She also has history of bloating and constipation and has been on simethicone and polyethylene glycol.  Patient states she is doing much better from GI standpoint.  She is not having any heartburn or throat symptoms.  She she forgot to take omeprazole on 2 or 3 occasions and did not have any breakthrough symptoms.  She is watching her diet.  She says she does not eat much.  She remains under a lot of stress because of financial reasons and she has not been recalled by her employer.  For the last 2 weeks she has been struggling with ocular migraine.  She has a follow-up visit with her neurologist.  She has lost 4 pounds since her last visit of November 14, 2019.  She says bloating has decreased.  Her bowels usually move daily.  She denies melena or rectal bleeding.  She is up-to-date on screening for CRC.  Last colonoscopy was in October, 2017 revealing small tubular adenoma and sigmoid diverticulosis.  Next colonoscopy in October 2024. Patient says she is not using fiber supplement polyethylene glycol or simethicone.   Current Medications: Outpatient Encounter Medications as of 02/02/2020  Medication Sig  . acetaminophen (TYLENOL) 500 MG tablet Take 500 mg by mouth every 6 (six) hours as needed (for pain).   Marland Kitchen ALPRAZolam (XANAX) 1 MG tablet TAKE ONE TABLET BY MOUTH TWICE DAILY AS NEEDED FOR ANXIETY.  . cholecalciferol (VITAMIN D3) 25 MCG (1000 UNIT) tablet Take 1,000 Units by mouth daily.  Marland Kitchen estradiol (ESTRACE) 0.1 MG/GM vaginal cream USE ONE-HALF APPLICATORFUL AT BEDTIME 2-3 TIMES WEEKLY  . hydrochlorothiazide (HYDRODIURIL) 25 MG tablet Take 25 mg by mouth daily.  Marland Kitchen levocetirizine (XYZAL)  5 MG tablet Take 5 mg by mouth daily. In the morning per the patient.  . Multiple Vitamin (MULTIVITAMIN) tablet Take 1 tablet by mouth daily.    Marland Kitchen omeprazole (PRILOSEC) 40 MG capsule Take 1 capsule (40 mg total) by mouth daily before breakfast. Take 1 capsule by mouth 30 minutes before breakfast and dinner  . polyethylene glycol powder (MIRALAX) 17 GM/SCOOP powder Take 8.5 g by mouth daily.  . Simethicone (PHAZYME) 180 MG CAPS Take 1 capsule (180 mg total) by mouth 2 (two) times daily as needed. (Patient not taking: Reported on 02/02/2020)  . Wheat Dextrin (BENEFIBER DRINK MIX) PACK Take 4 g by mouth at bedtime. (Patient not taking: Reported on 02/02/2020)  . zinc gluconate 50 MG tablet Take 50 mg by mouth daily.   No facility-administered encounter medications on file as of 02/02/2020.     Objective: Blood pressure 138/88, pulse 74, temperature (!) 97.3 F (36.3 C), temperature source Temporal, height 5\' 7"  (1.702 m), weight 122 lb (55.3 kg). Patient is alert and in no acute distress. Patient is wearing facial mask. Conjunctiva is pink. Sclera is nonicteric Oropharyngeal mucosa is normal. No neck masses or thyromegaly noted. Cardiac exam with regular rhythm normal S1 and S2. No murmur or gallop noted. Lungs are clear to auscultation. Abdomen is flat.  On palpation is soft and nontender with organomegaly or masses. No LE edema or clubbing noted.   Assessment:  #1.  GERD.  She is doing well with therapy.  I believe effort should be made  to reduce PPI dose and hopefully she can be maintained on 20 mg of omeprazole daily.  She will continue dietary measures as before.  #2.  History of constipation and bloating.  These symptoms have abated.   Plan:  New prescription given for omeprazole 20 mg p.o. twice daily 60 with 5 refills. However I would like for the patient to take omeprazole as follows 40 mg alternating with 20 mg every other day for 1 month. If she does well she will drop the  dose to 20 mg every day.  If symptoms not controlled with dose reduction she can go back to 40 mg alternating with 20 mg every other day until next office visit. Office visit in 6 months.

## 2020-02-02 NOTE — Patient Instructions (Signed)
For next 1 month alternate omeprazole 40 mg with 20 mg every other day.  If heartburn is well controlled on this regiment then drop dose to omeprazole 20 mg by mouth every morning.  If this dose does not control symptoms please call office.

## 2020-02-25 ENCOUNTER — Other Ambulatory Visit: Payer: Self-pay | Admitting: Adult Health

## 2020-03-08 ENCOUNTER — Telehealth (INDEPENDENT_AMBULATORY_CARE_PROVIDER_SITE_OTHER): Payer: Self-pay | Admitting: *Deleted

## 2020-03-08 NOTE — Telephone Encounter (Signed)
Patient called and states that she has ocular migraine. She saw a doctor and was put on Propranolol 10 mg. She states that since she has been taking she is having acid  reflux  and having to clear her throat again.Symptoms are the same as when we saw her prior to starting this medication.  She ask if she should stop this medication.I would think that she should ask the pre scriber who gave her the medication.  Please advise, Patient number is [phone].

## 2020-03-08 NOTE — Telephone Encounter (Signed)
I agree I would recommend that she contact the prescribing doctor regarding propranolol, it looks like at her last visit she was going to be tapering from 40 mg of omeprazole to 20 mg, if she is having worsening symptoms okay to increase back to 40 mg daily.

## 2020-03-09 NOTE — Telephone Encounter (Signed)
Patient called , message left with Laurine Blazer PA C recommendation.

## 2020-03-15 ENCOUNTER — Other Ambulatory Visit: Payer: Self-pay | Admitting: Neurology

## 2020-03-15 ENCOUNTER — Encounter: Payer: Self-pay | Admitting: Neurology

## 2020-03-19 ENCOUNTER — Ambulatory Visit: Payer: Medicare Other | Admitting: Neurology

## 2020-03-19 ENCOUNTER — Telehealth: Payer: Self-pay | Admitting: Neurology

## 2020-03-19 NOTE — Telephone Encounter (Signed)
Pt called the morning of stating having car issues and would not make the apt scheduled today.

## 2020-03-22 ENCOUNTER — Other Ambulatory Visit: Payer: Self-pay | Admitting: Adult Health

## 2020-03-26 ENCOUNTER — Ambulatory Visit: Payer: Medicare Other | Admitting: Cardiology

## 2020-03-26 ENCOUNTER — Encounter: Payer: Self-pay | Admitting: *Deleted

## 2020-03-26 ENCOUNTER — Encounter: Payer: Self-pay | Admitting: Cardiology

## 2020-03-26 ENCOUNTER — Ambulatory Visit (INDEPENDENT_AMBULATORY_CARE_PROVIDER_SITE_OTHER): Payer: Medicare Other | Admitting: Cardiology

## 2020-03-26 ENCOUNTER — Other Ambulatory Visit: Payer: Self-pay

## 2020-03-26 VITALS — BP 125/83 | HR 66 | Ht 67.0 in | Wt 129.4 lb

## 2020-03-26 DIAGNOSIS — I10 Essential (primary) hypertension: Secondary | ICD-10-CM

## 2020-03-26 DIAGNOSIS — R002 Palpitations: Secondary | ICD-10-CM | POA: Diagnosis not present

## 2020-03-26 NOTE — Patient Instructions (Addendum)
Medication Instructions:   Your physician recommends that you continue on your current medications as directed. Please refer to the Current Medication list given to you today. *If you need a refill on your cardiac medications before your next appointment, please call your pharmacy*   Lab Work:  NONE If you have labs (blood work) drawn today and your tests are completely normal, you will receive your results only by: Marland Kitchen MyChart Message (if you have MyChart) OR . A paper copy in the mail If you have any lab test that is abnormal or we need to change your treatment, we will call you to review the results.   Testing/Procedures:  NONE   Follow-Up: At [facility], you and your health needs are our priority.  As part of our continuing mission to provide you with exceptional heart care, we have created designated Provider Care Teams.  These Care Teams include your primary Cardiologist (physician) and Advanced Practice Providers (APPs -  Physician Assistants and Nurse Practitioners) who all work together to provide you with the care you need, when you need it.  We recommend signing up for the patient portal called "MyChart".  Sign up information is provided on this After Visit Summary.  MyChart is used to connect with patients for Virtual Visits (Telemedicine).  Patients are able to view lab/test results, encounter notes, upcoming appointments, etc.  Non-urgent messages can be sent to your provider as well.   To learn more about what you can do with MyChart, go to [email or web address].    Your next appointment:    1 year  The format for your next appointment:    In Person  Provider:   You may see Carlyle Dolly, MD or the following Advanced Practice Provider on your designated Care Team:    Katina Dung, NP

## 2020-03-26 NOTE — Progress Notes (Deleted)
Clinical Summary April Knox is a 64 y.o.female seen today for follow up of the following medical problems.  1. PSVT - history is unclear from available notes. Notes mention PSVT documented on event recorder however do not see monitor report in our system EKGs reviewed, show normal sinus rhythm   - no recent palpitaitons.     2.HTN - she is compliant with meds  3. Prediabetes - last HgbA1c 6.1  - followd by pcp    SH: works part time at [employer]   Past Medical History:  Diagnosis Date  . Abnormal Pap smear   . Abnormal Papanicolaou smear of cervix with positive human papilloma virus (HPV) test 04/06/2018   Pap was LSIL with +HPV, will need colpo____  . Anxiety   . Back pain 03/07/2014  . Cervical spine disease    Mild  . Chest pain   . Elevated cholesterol   . Ganglion cyst of left foot 12/06/2013  . H/O bilateral breast implants 03/03/2013  . Hematuria 03/28/2014  . Hot flashes 12/06/2013  . Hypertension   . Menopausal symptoms 12/06/2013  . Mental disorder    anxiety  . Migraine with visual aura   . Osteopenia after menopause 10/06/2018   DEXA 09/28/2018, osteopenia, take calcium vitamin D and stay active   . PSVT (paroxysmal supraventricular tachycardia) ([place])    Documented by event recorder  . Rectocele 03/03/2013  . RUQ pain 03/07/2014  . Vaginal discharge 02/24/2014  . Vaginal dryness, menopausal 12/06/2013  . Vaginal Pap smear, abnormal   . Yeast infection 02/24/2014     Allergies  Allergen Reactions  . Fish Allergy Anaphylaxis and Swelling    Throat closes.  . Bactrim Ds [Sulfamethoxazole-Trimethoprim]     Hives   . Ciprocinonide [Fluocinolone] Hives    " made me feel awful"   . Codeine Other (See Comments)    DROPS PT. BLOOD PRESSURE  . Penicillins     Has patient had a PCN reaction causing immediate rash, facial/tongue/throat swelling, SOB or lightheadedness with hypotension:unsure Has patient had a PCN reaction causing severe rash  involving mucus membranes or skin necrosis:unsure Has patient had a PCN reaction that required hospitalization:No Has patient had a PCN reaction occurring within the last 10 years:No If all of the above answers are "NO", then may proceed with Cephalosporin use. Childhood reaction   . Sulfa Antibiotics Hives, Diarrhea, Nausea Only and Rash     Current Outpatient Medications  Medication Sig Dispense Refill  . acetaminophen (TYLENOL) 500 MG tablet Take 500 mg by mouth every 6 (six) hours as needed (for pain).     Marland Kitchen ALPRAZolam (XANAX) 1 MG tablet TAKE ONE TABLET BY MOUTH TWICE DAILY AS NEEDED FOR ANXIETY. 60 tablet 0  . cholecalciferol (VITAMIN D3) 25 MCG (1000 UNIT) tablet Take 1,000 Units by mouth daily.    . Cyanocobalamin (VITAMIN B 12 PO) Take 1,000 mcg by mouth daily.    . diclofenac Sodium (VOLTAREN) 1 % GEL diclofenac 1 % topical gel  APPLY AS DIRECTED FOUR TIMES DAILY    . estradiol (ESTRACE) 0.1 MG/GM vaginal cream USE ONE-HALF APPLICATORFUL AT BEDTIME 2-3 TIMES WEEKLY 42.5 g 3  . famotidine (PEPCID) 20 MG tablet Take 20 mg by mouth 2 (two) times daily.    . fluticasone (FLONASE) 50 MCG/ACT nasal spray Place 1 spray into both nostrils daily.    . hydrochlorothiazide (HYDRODIURIL) 25 MG tablet Take 25 mg by mouth daily.    Marland Kitchen ipratropium (  ATROVENT) 0.06 % nasal spray Place 2 sprays into both nostrils 2 (two) times daily as needed.    Marland Kitchen levocetirizine (XYZAL) 5 MG tablet Take 5 mg by mouth daily. In the morning per the patient.    Marland Kitchen linaclotide (LINZESS) 72 MCG capsule Take 72 mcg by mouth daily before breakfast.    . montelukast (SINGULAIR) 10 MG tablet Take 10 mg by mouth at bedtime.    . Multiple Vitamin (MULTIVITAMIN) tablet Take 1 tablet by mouth daily.      Marland Kitchen omeprazole (PRILOSEC) 20 MG capsule Take 1 capsule (20 mg total) by mouth 2 (two) times daily before a meal. 60 capsule 5  . omeprazole (PRILOSEC) 40 MG capsule Take 1 capsule (40 mg total) by mouth daily before breakfast.  Take 1 capsule by mouth 30 minutes before breakfast and dinner 30 capsule 5  . rizatriptan (MAXALT) 10 MG tablet Take 10 mg by mouth every 4 (four) hours as needed.    . SUMAtriptan (IMITREX) 100 MG tablet Take 100 mg by mouth 2 (two) times daily as needed.    . topiramate (TOPAMAX) 25 MG tablet Take 25 mg by mouth 2 (two) times daily.    Marland Kitchen zinc gluconate 50 MG tablet Take 50 mg by mouth daily.     No current facility-administered medications for this visit.     Past Surgical History:  Procedure Laterality Date  . BIOPSY N/A 12/16/2019   Procedure: BIOPSY;  Surgeon: Rogene Houston, MD;  [facility];  Service: Endoscopy;  Laterality: N/A;  . BREAST ENHANCEMENT SURGERY    . COLONOSCOPY N/A 06/25/2016   Procedure: COLONOSCOPY;  Surgeon: Rogene Houston, MD;  [facility];  Service: Endoscopy;  Laterality: N/A;  1:00-moved to [place] to notify pt  . ESOPHAGOGASTRODUODENOSCOPY (EGD) WITH PROPOFOL N/A 12/16/2019   Procedure: ESOPHAGOGASTRODUODENOSCOPY (EGD) WITH PROPOFOL;  Surgeon: Rogene Houston, MD;  [facility];  Service: Endoscopy;  Laterality: N/A;  830  . POLYPECTOMY  06/25/2016   Procedure: POLYPECTOMY;  Surgeon: Rogene Houston, MD;  [facility];  Service: Endoscopy;;  colon   . toe biopsy       Allergies  Allergen Reactions  . Fish Allergy Anaphylaxis and Swelling    Throat closes.  . Bactrim Ds [Sulfamethoxazole-Trimethoprim]     Hives   . Ciprocinonide [Fluocinolone] Hives    " made me feel awful"   . Codeine Other (See Comments)    DROPS PT. BLOOD PRESSURE  . Penicillins     Has patient had a PCN reaction causing immediate rash, facial/tongue/throat swelling, SOB or lightheadedness with hypotension:unsure Has patient had a PCN reaction causing severe rash involving mucus membranes or skin necrosis:unsure Has patient had a PCN reaction that required hospitalization:No Has patient had a PCN reaction occurring within the  last 10 years:No If all of the above answers are "NO", then may proceed with Cephalosporin use. Childhood reaction   . Sulfa Antibiotics Hives, Diarrhea, Nausea Only and Rash      Family History  Problem Relation Age of Onset  . Dementia Father   . Cancer Mother   . Healthy Daughter   . Healthy Daughter   . Healthy Daughter   . Cancer Maternal Grandmother   . Heart disease Maternal Grandmother   . Diabetes Maternal Grandfather   . Heart attack Paternal Grandmother      Social History Ms. Leverette reports that she has never smoked. She has never used  smokeless tobacco. Ms. Renbarger reports no history of alcohol use.   Review of Systems CONSTITUTIONAL: No weight loss, fever, chills, weakness or fatigue.  HEENT: Eyes: No visual loss, blurred vision, double vision or yellow sclerae.No hearing loss, sneezing, congestion, runny nose or sore throat.  SKIN: No rash or itching.  CARDIOVASCULAR:  RESPIRATORY: No shortness of breath, cough or sputum.  GASTROINTESTINAL: No anorexia, nausea, vomiting or diarrhea. No abdominal pain or blood.  GENITOURINARY: No burning on urination, no polyuria NEUROLOGICAL: No headache, dizziness, syncope, paralysis, ataxia, numbness or tingling in the extremities. No change in bowel or bladder control.  MUSCULOSKELETAL: No muscle, back pain, joint pain or stiffness.  LYMPHATICS: No enlarged nodes. No history of splenectomy.  PSYCHIATRIC: No history of depression or anxiety.  ENDOCRINOLOGIC: No reports of sweating, cold or heat intolerance. No polyuria or polydipsia.  Marland Kitchen   Physical Examination There were no vitals filed for this visit. There were no vitals filed for this visit.  Gen: resting comfortably, no acute distress HEENT: no scleral icterus, pupils equal round and reactive, no palptable cervical adenopathy,  CV Resp: Clear to auscultation bilaterally GI: abdomen is soft, non-tender, non-distended, normal bowel sounds, no  hepatosplenomegaly MSK: extremities are warm, no edema.  Skin: warm, no rash Neuro:  no focal deficits Psych: appropriate affect   Diagnostic Studies     Assessment and Plan  1. PSVT - long history of palpitations She has been resistant to medical therapyand remains so - we will continue to monitor at this time  2.HTN - she is at goal, continue current meds     Arnoldo Lenis, M.D., F.A.C.C.

## 2020-03-26 NOTE — Progress Notes (Signed)
Clinical Summary April Knox is a 64 y.o.female seen today for follow up of the following medical problems.  1. PSVT - history is unclear from available notes. Notes mention PSVT documented on event recorder however do not see monitor report in our system EKGs reviewed, show normal sinus rhythm    - rare, once or twice every 6 months she will have palpitations - has been resistant to medications       Past Medical History:  Diagnosis Date  . Abnormal Pap smear   . Abnormal Papanicolaou smear of cervix with positive human papilloma virus (HPV) test 04/06/2018   Pap was LSIL with +HPV, will need colpo____  . Anxiety   . Back pain 03/07/2014  . Cervical spine disease    Mild  . Chest pain   . Elevated cholesterol   . Ganglion cyst of left foot 12/06/2013  . H/O bilateral breast implants 03/03/2013  . Hematuria 03/28/2014  . Hot flashes 12/06/2013  . Hypertension   . Menopausal symptoms 12/06/2013  . Mental disorder    anxiety  . Migraine with visual aura   . Osteopenia after menopause 10/06/2018   DEXA 09/28/2018, osteopenia, take calcium vitamin D and stay active   . PSVT (paroxysmal supraventricular tachycardia) ([place])    Documented by event recorder  . Rectocele 03/03/2013  . RUQ pain 03/07/2014  . Vaginal discharge 02/24/2014  . Vaginal dryness, menopausal 12/06/2013  . Vaginal Pap smear, abnormal   . Yeast infection 02/24/2014     Allergies  Allergen Reactions  . Fish Allergy Anaphylaxis and Swelling    Throat closes.  . Bactrim Ds [Sulfamethoxazole-Trimethoprim]     Hives   . Ciprocinonide [Fluocinolone] Hives    " made me feel awful"   . Codeine Other (See Comments)    DROPS PT. BLOOD PRESSURE  . Penicillins     Has patient had a PCN reaction causing immediate rash, facial/tongue/throat swelling, SOB or lightheadedness with hypotension:unsure Has patient had a PCN reaction causing severe rash involving mucus membranes or skin necrosis:unsure Has patient had  a PCN reaction that required hospitalization:No Has patient had a PCN reaction occurring within the last 10 years:No If all of the above answers are "NO", then may proceed with Cephalosporin use. Childhood reaction   . Sulfa Antibiotics Hives, Diarrhea, Nausea Only and Rash     Current Outpatient Medications  Medication Sig Dispense Refill  . acetaminophen (TYLENOL) 500 MG tablet Take 500 mg by mouth every 6 (six) hours as needed (for pain).     Marland Kitchen ALPRAZolam (XANAX) 1 MG tablet TAKE ONE TABLET BY MOUTH TWICE DAILY AS NEEDED FOR ANXIETY. 60 tablet 0  . cholecalciferol (VITAMIN D3) 25 MCG (1000 UNIT) tablet Take 1,000 Units by mouth daily.    . Cyanocobalamin (VITAMIN B 12 PO) Take 1,000 mcg by mouth daily.    . diclofenac Sodium (VOLTAREN) 1 % GEL diclofenac 1 % topical gel  APPLY AS DIRECTED FOUR TIMES DAILY    . estradiol (ESTRACE) 0.1 MG/GM vaginal cream USE ONE-HALF APPLICATORFUL AT BEDTIME 2-3 TIMES WEEKLY 42.5 g 3  . famotidine (PEPCID) 20 MG tablet Take 20 mg by mouth 2 (two) times daily.    . fluticasone (FLONASE) 50 MCG/ACT nasal spray Place 1 spray into both nostrils daily.    . hydrochlorothiazide (HYDRODIURIL) 25 MG tablet Take 25 mg by mouth daily.    Marland Kitchen ipratropium (ATROVENT) 0.06 % nasal spray Place 2 sprays into both nostrils 2 (two) times  daily as needed.    Marland Kitchen levocetirizine (XYZAL) 5 MG tablet Take 5 mg by mouth daily. In the morning per the patient.    Marland Kitchen linaclotide (LINZESS) 72 MCG capsule Take 72 mcg by mouth daily before breakfast.    . montelukast (SINGULAIR) 10 MG tablet Take 10 mg by mouth at bedtime.    . Multiple Vitamin (MULTIVITAMIN) tablet Take 1 tablet by mouth daily.      Marland Kitchen omeprazole (PRILOSEC) 20 MG capsule Take 1 capsule (20 mg total) by mouth 2 (two) times daily before a meal. 60 capsule 5  . omeprazole (PRILOSEC) 40 MG capsule Take 1 capsule (40 mg total) by mouth daily before breakfast. Take 1 capsule by mouth 30 minutes before breakfast and dinner 30  capsule 5  . rizatriptan (MAXALT) 10 MG tablet Take 10 mg by mouth every 4 (four) hours as needed.    . SUMAtriptan (IMITREX) 100 MG tablet Take 100 mg by mouth 2 (two) times daily as needed.    . topiramate (TOPAMAX) 25 MG tablet Take 25 mg by mouth 2 (two) times daily.    Marland Kitchen zinc gluconate 50 MG tablet Take 50 mg by mouth daily.     No current facility-administered medications for this visit.     Past Surgical History:  Procedure Laterality Date  . BIOPSY N/A 12/16/2019   Procedure: BIOPSY;  Surgeon: Rogene Houston, MD;  [facility];  Service: Endoscopy;  Laterality: N/A;  . BREAST ENHANCEMENT SURGERY    . COLONOSCOPY N/A 06/25/2016   Procedure: COLONOSCOPY;  Surgeon: Rogene Houston, MD;  [facility];  Service: Endoscopy;  Laterality: N/A;  1:00-moved to [place] to notify pt  . ESOPHAGOGASTRODUODENOSCOPY (EGD) WITH PROPOFOL N/A 12/16/2019   Procedure: ESOPHAGOGASTRODUODENOSCOPY (EGD) WITH PROPOFOL;  Surgeon: Rogene Houston, MD;  [facility];  Service: Endoscopy;  Laterality: N/A;  830  . POLYPECTOMY  06/25/2016   Procedure: POLYPECTOMY;  Surgeon: Rogene Houston, MD;  [facility];  Service: Endoscopy;;  colon   . toe biopsy       Allergies  Allergen Reactions  . Fish Allergy Anaphylaxis and Swelling    Throat closes.  . Bactrim Ds [Sulfamethoxazole-Trimethoprim]     Hives   . Ciprocinonide [Fluocinolone] Hives    " made me feel awful"   . Codeine Other (See Comments)    DROPS PT. BLOOD PRESSURE  . Penicillins     Has patient had a PCN reaction causing immediate rash, facial/tongue/throat swelling, SOB or lightheadedness with hypotension:unsure Has patient had a PCN reaction causing severe rash involving mucus membranes or skin necrosis:unsure Has patient had a PCN reaction that required hospitalization:No Has patient had a PCN reaction occurring within the last 10 years:No If all of the above answers are "NO", then may  proceed with Cephalosporin use. Childhood reaction   . Sulfa Antibiotics Hives, Diarrhea, Nausea Only and Rash      Family History  Problem Relation Age of Onset  . Dementia Father   . Cancer Mother   . Healthy Daughter   . Healthy Daughter   . Healthy Daughter   . Cancer Maternal Grandmother   . Heart disease Maternal Grandmother   . Diabetes Maternal Grandfather   . Heart attack Paternal Grandmother      Social History Ms. Josey reports that she has never smoked. She has never used smokeless tobacco. Ms. Shelden reports no history of alcohol use.   Review of  Systems CONSTITUTIONAL: No weight loss, fever, chills, weakness or fatigue.  HEENT: Eyes: No visual loss, blurred vision, double vision or yellow sclerae.No hearing loss, sneezing, congestion, runny nose or sore throat.  SKIN: No rash or itching.  CARDIOVASCULAR: per hpi RESPIRATORY: No shortness of breath, cough or sputum.  GASTROINTESTINAL: No anorexia, nausea, vomiting or diarrhea. No abdominal pain or blood.  GENITOURINARY: No burning on urination, no polyuria NEUROLOGICAL: No headache, dizziness, syncope, paralysis, ataxia, numbness or tingling in the extremities. No change in bowel or bladder control.  MUSCULOSKELETAL: No muscle, back pain, joint pain or stiffness.  LYMPHATICS: No enlarged nodes. No history of splenectomy.  PSYCHIATRIC: No history of depression or anxiety.  ENDOCRINOLOGIC: No reports of sweating, cold or heat intolerance. No polyuria or polydipsia.  Marland Kitchen   Physical Examination Today's Vitals   03/26/20 1211  BP: 125/83  Pulse: 66  SpO2: 95%  Weight: 129 lb 6.4 oz (58.7 kg)  Height: 5\' 7"  (1.702 m)   Body mass index is 20.27 kg/m.  Gen: resting comfortably, no acute distress HEENT: no scleral icterus, pupils equal round and reactive, no palptable cervical adenopathy,  CV: RRR, no m/r/g, no jvd Resp: Clear to auscultation bilaterally GI: abdomen is soft, non-tender, non-distended,  normal bowel sounds, no hepatosplenomegaly MSK: extremities are warm, no edema.  Skin: warm, no rash Neuro:  no focal deficits Psych: appropriate affect    Assessment and Plan  1. PSVT - long history of palpitations She has been resistant to medical therapy - infrequent symptoms, continue to monitor at this time - EKG today shows NSR  2. HTN -at goal, continue current meds     Arnoldo Lenis, M.D.

## 2020-04-02 ENCOUNTER — Other Ambulatory Visit: Payer: Self-pay | Admitting: Adult Health

## 2020-04-02 ENCOUNTER — Ambulatory Visit: Payer: Medicare Other | Admitting: Family Medicine

## 2020-04-06 ENCOUNTER — Other Ambulatory Visit (INDEPENDENT_AMBULATORY_CARE_PROVIDER_SITE_OTHER): Payer: Self-pay | Admitting: Internal Medicine

## 2020-04-23 ENCOUNTER — Other Ambulatory Visit: Payer: Self-pay | Admitting: Adult Health

## 2020-05-09 ENCOUNTER — Other Ambulatory Visit (HOSPITAL_COMMUNITY)
Admission: RE | Admit: 2020-05-09 | Discharge: 2020-05-09 | Disposition: A | Discharge status: Home / Self Care | Payer: Medicare Other | Source: Ambulatory Visit | Attending: Adult Health | Admitting: Adult Health

## 2020-05-09 ENCOUNTER — Encounter: Payer: Self-pay | Admitting: Adult Health

## 2020-05-09 ENCOUNTER — Ambulatory Visit (INDEPENDENT_AMBULATORY_CARE_PROVIDER_SITE_OTHER): Payer: Medicare Other | Admitting: Adult Health

## 2020-05-09 VITALS — BP 130/79 | HR 83 | Ht 67.0 in | Wt 127.0 lb

## 2020-05-09 DIAGNOSIS — Z1151 Encounter for screening for human papillomavirus (HPV): Secondary | ICD-10-CM | POA: Insufficient documentation

## 2020-05-09 DIAGNOSIS — Z1211 Encounter for screening for malignant neoplasm of colon: Secondary | ICD-10-CM | POA: Diagnosis not present

## 2020-05-09 DIAGNOSIS — F419 Anxiety disorder, unspecified: Secondary | ICD-10-CM

## 2020-05-09 DIAGNOSIS — N951 Menopausal and female climacteric states: Secondary | ICD-10-CM

## 2020-05-09 DIAGNOSIS — Z0000 Encounter for general adult medical examination without abnormal findings: Secondary | ICD-10-CM | POA: Diagnosis not present

## 2020-05-09 DIAGNOSIS — Z01419 Encounter for gynecological examination (general) (routine) without abnormal findings: Secondary | ICD-10-CM | POA: Insufficient documentation

## 2020-05-09 DIAGNOSIS — Z8742 Personal history of other diseases of the female genital tract: Secondary | ICD-10-CM | POA: Insufficient documentation

## 2020-05-09 LAB — HEMOCCULT GUIAC POC 1CARD (OFFICE): Fecal Occult Blood, POC: NEGATIVE

## 2020-05-09 MED ORDER — ESTRADIOL 0.1 MG/GM VA CREA: 1.0000 | TOPICAL_CREAM | VAGINAL | 3 refills | Status: DC

## 2020-05-09 NOTE — Progress Notes (Signed)
[name], female   DOB: May 22, 1956, 64 y.o.   MRN: [id number] History of Present Illness: April Knox is a 64 year old white female, widowed,PM, in for a well woman gyn exam and pap, had LSIL,+HPV on pap 04/01/18, and had colpo, no dysplasia. She says she was laid off from second job and she works in [employer] and has filed chapter [employer]. She has anxiety and takes xanax 1 mg 1-2 times daily, will try weaning slowly. She thinks she had hemorrhoid last week. She says she had ocular migraine and sees eye doctor today in [place].  PCP is Dr Sherrie Sport   Current Medications, Allergies, Past Medical History, Past Surgical History, Family History and Social History were reviewed in [facility] record.     Review of Systems:  Patient denies any headaches, hearing loss, fatigue, blurred vision, shortness of breath, chest pain, abdominal pain, problems with bowel movements, urination, or intercourse. No joint pain or mood swings. She HPI for positives.   Physical Exam:BP 130/79 (BP Location: Left Arm, Patient Position: Sitting, Cuff Size: Normal)   Pulse 83   Ht 5\' 7"  (1.702 m)   Wt 127 lb (57.6 kg)   BMI 19.89 kg/m  General:  Well developed, well nourished, no acute distress Skin:  Warm and dry Neck:  Midline trachea, normal thyroid, good ROM, no lymphadenopathy,no carotid bruits heard Lungs; Clear to auscultation bilaterally Breast:  No dominant palpable mass, retraction, or nipple discharge,has bilateral implants  Cardiovascular: Regular rate and rhythm Abdomen:  Soft, non tender, no hepatosplenomegaly Pelvic:  External genitalia is normal in appearance, no lesions.  The vagina is normal in appearance. Urethra has no lesions or masses. The cervix is smooth, pap with GC/CHL and high risk HPV genotyping performed.  Uterus is felt to be normal size, shape, and contour.  No adnexal masses or tenderness noted.Bladder is non tender, no masses felt. Rectal:  Good sphincter tone, no polyps, + hemorrhoids felt.  Hemoccult negative. Extremities/musculoskeletal:  No swelling or varicosities noted, no clubbing or cyanosis Psych:  No mood changes, alert and cooperative,seems happy AA is 0 Fall risk is low PHQ 9 score is 8, no SI, is stressed   Upstream - 05/09/20 1102      Pregnancy Intention Screening   Does the patient want to become pregnant in the next year? N/A   post menopausal   Does the patient's partner want to become pregnant in the next year? N/A      Contraception Wrap Up   Current Method No Method - Other Reason    End Method No Method - Other Reason    Contraception Counseling Provided No   post menopausal        Examination chaperoned by Dwyane Dee LPN  Impression and Plan: 1. Encounter for gynecological examination with Papanicolaou smear of cervix Pap sent Physical in 1 year Pap in 3 if normal Get mammogram, last one 10/2018, she made appt for 12/6 at the [facility]  Colonoscopy per GI  2. Encounter for screening fecal occult blood testing  3. Anxiety Will try to wean xanax down slowly   4. Vaginal dryness, menopausal Will refill estrace cream Meds ordered this encounter  Medications  . estradiol (ESTRACE VAGINAL) 0.1 MG/GM vaginal cream    Sig: Place 1 Applicatorful vaginally 3 (three) times a week.    Dispense:  42.5 g    Refill:  3    Order Specific Question:   Supervising Provider  Answer:   EURE, LUTHER H [[id number]]    5. History of abnormal cervical Pap smear Pap sent

## 2020-05-14 ENCOUNTER — Telehealth: Payer: Self-pay | Admitting: *Deleted

## 2020-05-14 ENCOUNTER — Telehealth: Payer: Self-pay | Admitting: Adult Health

## 2020-05-14 DIAGNOSIS — R87618 Other abnormal cytological findings on specimens from cervix uteri: Secondary | ICD-10-CM

## 2020-05-14 LAB — CYTOLOGY - PAP
Chlamydia: NEGATIVE
Comment: NEGATIVE
Comment: NEGATIVE
Comment: NEGATIVE
Comment: NORMAL
HPV 16: NEGATIVE
HPV 18 / 45: NEGATIVE
High risk HPV: POSITIVE — AB
Neisseria Gonorrhea: NEGATIVE

## 2020-05-14 NOTE — Progress Notes (Signed)
[facility]    Provider:  [name] Requesting Provider: [name], MD Primary Care Provider:  [name], MD  CC:  Vision loss  HPI:  April Knox is a 64 y.o. female here as requested by [name], MD for migraine. PMHx PSVT, anxiety, I reviewed Dr.Hasanaj's notes: Patient has a history of migraines, with aura, also has dull headaches and at appointment in May of this year she had a dull headache for at least a week, she also describes of vision changes unclear possibly an aura, Imitrex and Tylenol help, headaches at that time had been worsening for several weeks, gradual in onset, dull, throbbing, bandlike, bilateral involving the frontal and temporal area 7 out of 10 in severity aggravated by cold, hunger, tea, coffee, sleep deprivation, perfume/[place] and relieved by nothing, physical exam was normal including head, ears, eyes, nose, throat, lungs, heart, abdomen.  She refused Topamax in the past.  Migraines started 8-10 years ago, she was going to the ICU and when she walked through the door she started seeing kaleidoscope in both eyes didn't last long, diagnosed with ocular migraines. Several years ago she had similar episode, lasts 4-5 minutes, she can still see and function, not followed by significant headaches, this last time it was 3 months, she had a dull headache, she is under a lot of stress due to covid and lost house and job, usually tylenol helps, she opened a blind and she started seeing the kaleidoscope. Lasted 5 minutes and resolved on its own. But her vision in her left eye was seeing floaters. She has has migraine with head pain years ago but usually its just vision changes that resolve and happened 3x in the last 10 years. Eye exam was essentially normal included dilated exam. But she feels her left eye vision is still impaired, she is seeing floaters and she can't focus in her left eye, sensitivity to light. She saw [name] but she  says he didn't so anything, she had a cat scan, she did not tolerate Topamax and she just wants to make sure everything is ok, he told her to stop Topamax. She declined any medication. She has stopped the Topamax and see no difference, not better or worse, but she had been taking the Topamax for 3 days only, she took Nurtec and she felt better. She does not get headaches often at all. She has vision loss left eye. She takes Magnesium daily. Currently doesn't feel can see as well out the left eye, having floaters and focusing problems, sensitivity to light and subjective vision loss no headache, no jaw pain, no tenderness sin the temples, neck pain.Nurtec helped. Not eating too well, just snacking. No other focal neurologic deficits, associated symptoms, inciting events or modifiable factors.  Reviewed notes, labs and imaging from outside physicians, which showed:  01/2020; TSH normal, HgbA1c 5.9  Requested CT scan from [name] to review. Reviewed imaging on CD 02-19-2020 and report on PACS:   CT head showed No acute intracranial abnormalities including mass lesion or mass effect, hydrocephalus, extra-axial fluid collection, midline shift, hemorrhage, or acute infarction, large ischemic events (personally reviewed images). Assymmetry of the ventricles likely not clinically significant.  Personally reviewed family eye care notes and evaluation, patient was seen for left eye vision loss, floaters, complaining about decreased vision in left eye, redness, pain in the eye, examination included tonometry external exam, slit-lamp exam, diagnosed with recurrence of ocular migraine symptoms, dilated exam and visual  field testing unremarkable, recommended further evaluation with neurologist and MRI.  Review of Systems: Patient complains of symptoms per HPI as well as the following symptoms: headache, vision loss. Pertinent negatives and positives per HPI. All others negative.   Social History    Socioeconomic History  . Marital status: Widowed    Spouse name: Not on file  . Number of children: 3  . Years of education: Not on file  . Highest education level: Not on file  Occupational History  . Occupation: Unemployed  Tobacco Use  . Smoking status: Never Smoker  . Smokeless tobacco: Never Used  Vaping Use  . Vaping Use: Never used  Substance and Sexual Activity  . Alcohol use: No    Alcohol/week: 0.0 standard drinks  . Drug use: No  . Sexual activity: Yes    Birth control/protection: Post-menopausal  Other Topics Concern  . Not on file  Social History Narrative   Lives alone   Right handed   Caffeine: none   Social Determinants of Health   Financial Resource Strain: Medium Risk  . Difficulty of Paying Living Expenses: Somewhat hard  Food Insecurity: Food Insecurity Present  . Worried About Charity fundraiser in the Last Year: Sometimes true  . Ran Out of Food in the Last Year: Sometimes true  Transportation Needs: No Transportation Needs  . Lack of Transportation (Medical): No  . Lack of Transportation (Non-Medical): No  Physical Activity: Inactive  . Days of Exercise per Week: 4 days  . Minutes of Exercise per Session: 0 min  Stress: Stress Concern Present  . Feeling of Stress : To some extent  Social Connections: Socially Isolated  . Frequency of Communication with Friends and Family: More than three times a week  . Frequency of Social Gatherings with Friends and Family: Never  . Attends Religious Services: Never  . Active Member of Clubs or Organizations: No  . Attends Archivist Meetings: Never  . Marital Status: Widowed  Intimate Partner Violence: Not At Risk  . Fear of Current or Ex-Partner: No  . Emotionally Abused: No  . Physically Abused: No  . Sexually Abused: No    Family History  Problem Relation Age of Onset  . Dementia Father   . Cancer Mother   . Migraines Mother   . Healthy Daughter   . Healthy Daughter   . Healthy  Daughter   . Cancer Maternal Grandmother   . Heart disease Maternal Grandmother   . Diabetes Maternal Grandfather   . Heart attack Paternal Grandmother     Past Medical History:  Diagnosis Date  . Abnormal Pap smear   . Abnormal Papanicolaou smear of cervix with positive human papilloma virus (HPV) test 04/06/2018   Pap was LSIL with +HPV, will need colpo____  . Anxiety   . Back pain 03/07/2014  . Cervical spine disease    Mild  . Chest pain   . Elevated cholesterol   . Ganglion cyst of left foot 12/06/2013  . H/O bilateral breast implants 03/03/2013  . Hematuria 03/28/2014  . Hot flashes 12/06/2013  . Hypertension   . Menopausal symptoms 12/06/2013  . Mental disorder    anxiety  . Migraine with visual aura   . Osteopenia after menopause 10/06/2018   DEXA 09/28/2018, osteopenia, take calcium vitamin D and stay active   . PSVT (paroxysmal supraventricular tachycardia) ([place])    Documented by event recorder  . Rectocele 03/03/2013  . RUQ pain 03/07/2014  . Vaginal discharge  02/24/2014  . Vaginal dryness, menopausal 12/06/2013  . Vaginal Pap smear, abnormal   . Yeast infection 02/24/2014    Patient Active Problem List   Diagnosis Date Noted  . Monocular vision loss 05/15/2020  . Ocular migraine 05/15/2020  . Encounter for screening fecal occult blood testing 05/09/2020  . History of abnormal cervical Pap smear 05/09/2020  . Nasal sinus congestion 06/07/2019  . Dyspareunia, female 06/07/2019  . Subacute maxillary sinusitis 06/07/2019  . Globus sensation 06/01/2019  . Constipation 11/03/2018  . Abdominal bloating 11/03/2018  . Osteopenia after menopause 10/06/2018  . Abnormal Papanicolaou smear of cervix with positive human papilloma virus (HPV) test 04/06/2018  . Screening for colorectal cancer 04/01/2018  . Encounter for gynecological examination with Papanicolaou smear of cervix 04/01/2018  . Elevated hemoglobin A1c 04/01/2018  . Vaginal atrophy 04/01/2018  . Well woman exam with  routine gynecological exam 03/30/2017  . Other fatigue 01/06/2017  . Seasonal allergic rhinitis due to pollen 01/06/2017  . History of colonic polyps 04/16/2016  . BV (bacterial vaginosis) 10/09/2014  . Bloating 07/20/2014  . Postmenopausal atrophic vaginitis 07/17/2014  . Hematuria 03/28/2014  . RUQ pain 03/07/2014  . Back pain 03/07/2014  . Elevated BP 03/07/2014  . Vaginal discharge 02/24/2014  . Yeast infection 02/24/2014  . Vaginal dryness, menopausal 12/06/2013  . Hot flashes 12/06/2013  . Menopausal symptoms 12/06/2013  . Ganglion cyst of left foot 12/06/2013  . GERD (gastroesophageal reflux disease) 08/11/2013  . Rectocele 03/03/2013  . History of breast surgery 03/03/2013  . Laboratory test 07/22/2012  . PSVT (paroxysmal supraventricular tachycardia) ([place])   . Anxiety 06/26/2011    Past Surgical History:  Procedure Laterality Date  . BIOPSY N/A 12/16/2019   Procedure: BIOPSY;  Surgeon: Rogene Houston, MD;  [facility];  Service: Endoscopy;  Laterality: N/A;  . BREAST ENHANCEMENT SURGERY    . COLONOSCOPY N/A 06/25/2016   Procedure: COLONOSCOPY;  Surgeon: Rogene Houston, MD;  Location: [facility];  Service: Endoscopy;  Laterality: N/A;  1:00-moved to [place] to notify pt  . ESOPHAGOGASTRODUODENOSCOPY (EGD) WITH PROPOFOL N/A 12/16/2019   Procedure: ESOPHAGOGASTRODUODENOSCOPY (EGD) WITH PROPOFOL;  Surgeon: Rogene Houston, MD;  [facility]: [facility];  Service: Endoscopy;  Laterality: N/A;  830  . POLYPECTOMY  06/25/2016   Procedure: POLYPECTOMY;  Surgeon: Rogene Houston, MD;  [facility]: [facility];  Service: Endoscopy;;  colon   . toe biopsy      Current Outpatient Medications  Medication Sig Dispense Refill  . acetaminophen (TYLENOL) 500 MG tablet Take 500 mg by mouth every 6 (six) hours as needed (for pain).     Marland Kitchen ALPRAZolam (XANAX) 1 MG tablet TAKE ONE TABLET BY MOUTH TWICE DAILY AS NEEDED FOR ANXIETY. 60 tablet 0  . cholecalciferol (VITAMIN  D3) 25 MCG (1000 UNIT) tablet Take 1,000 Units by mouth daily.    . Coenzyme Q10 (CO Q 10 PO) Take by mouth. Takes sometimes    . hydrochlorothiazide (HYDRODIURIL) 25 MG tablet Take 25 mg by mouth daily.    Marland Kitchen levocetirizine (XYZAL) 5 MG tablet Take 5 mg by mouth daily. In the morning per the patient.    Marland Kitchen MAGNESIUM PO Take by mouth.    . Multiple Vitamins-Minerals (ZINC PO) Take by mouth.    Marland Kitchen omeprazole (PRILOSEC) 20 MG capsule Take 1 capsule (20 mg total) by mouth 2 (two) times daily before a meal. 60 capsule 5  . ALPRAZolam (XANAX) 0.25 MG tablet Take  1-2 tabs (0.25mg -0.50mg ) 30-60 minutes before procedure. May repeat if needed.Do not drive. 4 tablet 0  . Rimegepant Sulfate (NURTEC) 75 MG TBDP Take 75 mg by mouth daily as needed. Dispense 10 or max allowed by insurance. Triptans contraindicated due to retinal migraines 10 tablet 6   No current facility-administered medications for this visit.    Allergies as of 05/15/2020 - Review Complete 05/09/2020  Allergen Reaction Noted  . Fish allergy Anaphylaxis and Swelling 06/23/2016  . Bactrim ds [sulfamethoxazole-trimethoprim]  01/19/2014  . Ciprocinonide [fluocinolone] Hives 01/30/2014  . Codeine Other (See Comments) 06/23/2016  . Penicillins  06/26/2011  . Sulfa antibiotics Hives, Diarrhea, Nausea Only, and Rash 11/01/2013    Vitals: BP (!) 150/83 (BP Location: Right Arm, Patient Position: Sitting)   Pulse 77   Ht 5\' 7"  (1.702 m)   Wt 129 lb (58.5 kg)   BMI 20.20 kg/m  Last Weight:  Wt Readings from Last 1 Encounters:  05/15/20 129 lb (58.5 kg)   Last Height:   Ht Readings from Last 1 Encounters:  05/15/20 5\' 7"  (1.702 m)     Physical exam: Exam: Gen: NAD, conversant, thin                  CV: RRR, no MRG. No Carotid Bruits. No peripheral edema, warm, nontender Eyes: Conjunctivae clear without exudates or hemorrhage  Neuro: Detailed Neurologic Exam  Speech:    Speech is normal; fluent and spontaneous with normal  comprehension.  Cognition:    The patient is oriented to person, place, and time;     recent and remote memory intact;     language fluent;     normal attention, concentration,     fund of knowledge Cranial Nerves:    The pupils are equal, round, and reactive to light. The fundi are nflat. Visual fields are full to finger confrontation. Extraocular movements are intact. Trigeminal sensation is intact and the muscles of mastication are normal. The face is symmetric. The palate elevates in the midline. Hearing intact. Voice is normal. Shoulder shrug is normal. The tongue has normal motion without fasciculations.   Coordination:    Normal finger to nose and heel to shin. Normal rapid alternating movements.   Gait:    Heel-toe and tandem gait are normal.   Motor Observation:    No asymmetry, no atrophy, and no involuntary movements noted. Tone:    Normal muscle tone.    Posture:    Posture is normal. normal erect    Strength:    Strength is V/V in the upper and lower limbs.      Sensation: intact to LT     Reflex Exam:  DTR's:    Deep tendon reflexes in the upper and lower extremities are normal bilaterally.   Toes:    The toes are downgoing bilaterally.   Clonus:    Clonus is absent.    Assessment/Plan: This is a lovely 64 year old patient here with acute monocular vision loss.  This is more than likely ocular/retinal migraines however given concerning symptoms such as reported vision loss, vision not returning to baseline, difficulty focusing with the left eye, "floaters", she needs a thorough work-up including MRI of the brain with and without contrast, MRA of the head as well as MRI with and without contrast of the neck to rule out dissection.  There are also some nutritional deficiencies that can cause problems with vision and considering she is extremely thin not eating well, under a lot of  stress and only snacking, I will check some blood work today as well.  MRI brain and  blood vessels of the head and neck as above Xanax for the MRI machine (can cause drowsiness, you need a driver to the imaging) Blood work today Magnesium citrate 400mg  to 600mg  daily, riboflavin 400mg  daily, Coenzyme Q 10 100mg  three times daily Nurtec as needed: Dispense 10 or max allowed by insurance. Triptans contraindicated due to retinal migraines. Will try and get approved will give samples in the meantime  Orders Placed This Encounter  Procedures  . MR BRAIN W WO CONTRAST  . MR ANGIO HEAD WO CONTRAST  . MR ANGIO NECK W WO CONTRAST  . Comprehensive metabolic panel  . CBC  . Sedimentation rate  . C-reactive protein  . Vitamin D, 25-hydroxy  . Vitamin A  . Zinc  . B12 and Folate Panel  . Methylmalonic acid, serum  . Vitamin B1  . Vitamin B6  . Magnesium   Meds ordered this encounter  Medications  . Rimegepant Sulfate (NURTEC) 75 MG TBDP    Sig: Take 75 mg by mouth daily as needed. Dispense 10 or max allowed by insurance. Triptans contraindicated due to retinal migraines    Dispense:  10 tablet    Refill:  6  . ALPRAZolam (XANAX) 0.25 MG tablet    Sig: Take 1-2 tabs (0.25mg -0.50mg ) 30-60 minutes before procedure. May repeat if needed.Do not drive.    Dispense:  4 tablet    Refill:  0   Discussed:  Requested Eye exam notes. Received in the office today. Discussed: To prevent or relieve headaches, try the following: Cool Compress. Lie down and place a cool compress on your head.  Avoid headache triggers. If certain foods or odors seem to have triggered your migraines in the past, avoid them. A headache diary might help you identify triggers.  Include physical activity in your daily routine. Try a daily walk or other moderate aerobic exercise.  Manage stress. Find healthy ways to cope with the stressors, such as delegating tasks on your to-do list.  Practice relaxation techniques. Try deep breathing, yoga, massage and visualization.  Eat regularly. Eating regularly  scheduled meals and maintaining a healthy diet might help prevent headaches. Also, drink plenty of fluids.  Follow a regular sleep schedule. Sleep deprivation might contribute to headaches Consider biofeedback. With this mind-body technique, you learn to control certain bodily functions -- such as muscle tension, heart rate and blood pressure -- to prevent headaches or reduce headache pain.    Proceed to emergency room if you experience new or worsening symptoms or symptoms do not resolve, if you have new neurologic symptoms or if headache is severe, or for any concerning symptom.   Provided education and documentation from American headache Society toolbox including articles on: chronic migraine medication overuse headache, chronic migraines, aura, ocular migraine, retinal migraine prevention of migraines, behavioral and other nonpharmacologic treatments for headache.  "There is increased risk for stroke in women with migraine with aura and a contraindication for the combined contraceptive pill for use by women who have migraine with aura. The risk for women with migraine without aura is lower. However other risk factors like smoking are far more likely to increase stroke risk than migraine. There is a recommendation for no smoking and for the use of OCPs without estrogen such as progestogen only pills particularly for women with migraine with aura.Marland Kitchen People who have migraine headaches with auras may be 3 times more likely  to have a stroke caused by a blood clot, compared to migraine patients who don't see auras. Women who take hormone-replacement therapy may be 30 percent more likely to suffer a clot-based stroke than women not taking medication containing estrogen. Other risk factors like smoking and high blood pressure may be  much more important."   Discussed: Published: June 15, 2012 The ideal medication for prevention and treatment of migraine would have no side effects, no risk, would be safe  in pregnancy, as well as being highly effective while remaining inexpensive. Of course, no such medication exists, but magnesium comes closer than many interventions on all these fronts.  Magnesium oxide is frequently used in pill form to prevent migraine, usually at a dose of 400-500 mg per day. Acutely, it can be dosed in pill form at the same dosage, or given intravenously as magnesium sulfate at 1-2 gm. The most frequent side effect is diarrhea, which can be helpful in those prone to constipation. The diarrhea and abdominal cramping that is sometimes experienced is dose responsive, such that a lower dose or decreasing the frequency of intake usually takes care of the problem.  Magnesium oxide in doses up to 400 mg is pregnancy category A, which means it can be used safely in pregnancy. Magnesium sulfate, typically given intravenously, now carries a warning related to bone thinning seen in the developing fetus when used longer than 5-7 days in a row. This was discovered in the context of high doses being given to pregnant women to prevent preterm labor.  The strongest evidence for magnesium's effectiveness is in patients who have, or have had, aura with their migraines. It is believed magnesium may prevent the wave of brain signaling, called cortical spreading depression, which produces the visual and sensory changes that are the common forms of aura. Other mechanisms of magnesium action include improved platelet function and decreased release or blocking of pain transmitting chemicals in the brain such as Substance P and glutamate. Magnesium may also prevent narrowing of brain blood vessels caused by the neurotransmitter serotonin.  Daily oral magnesium has also been shown to be effective in preventing menstrually related migraine, especially in those with premenstrual migraine. This means that preventive use can be targeted at those with aura and/or those with menstrually related migraine.  It is  difficult to measure magnesium levels accurately, as levels in the blood stream may represent only 2% of total body stores, with the rest of magnesium stored in the bones or within cells. Most importantly, simple magnesium blood levels do not give an accurate measure of magnesium levels in the brain. This has led to uncertainty concerning whether correcting a low magnesium level is necessary in treatment, or whether magnesium effectiveness is even related to low blood levels in the first place. Measurement of ionized magnesium or red blood cell magnesium levels is thought to possibly be more accurate, but these laboratory tests but are more difficult and expensive to obtain.  Because magnesium may not be accurately measured, low magnesium in the brain can be difficult to prove. Those prone to low magnesium include people with heart disease, diabetes, alcoholism, and those on diuretics for blood pressure. There is some evidence that migraineurs may have lower levels of brain magnesium either from decreased absorption of it in food, a genetic tendency to low brain magnesium, or from excreting it from the body to a greater degree than non-migraineurs. Studies of migraineurs have found low levels of brain and spinal fluid magnesium in between  migraine attacks.  In 2012, the American Headache Society and the [place] Academy of Neu[employer] reviewed the studies on medications used for migraine prevention and gave magnesium a Level B rating, that is, it is probably effective and should be considered for patients requiring migraine preventive therapy. Because of its safety profile and the lack of serious side effects, magnesium is often chosen as a preventive strategy either alone, or with other preventive medications.  Magnesium has also been studied for the acute, as-needed treatment of severe, difficult-to-treat migraine. Magnesium sulfate given intravenously was found to be most effective in those with a history of  migraine with aura. In those without a history of aura, no difference was seen in immediate pain relief or nausea relief by magnesium, but there was less light and noise sensitivity after the infusion.  Magnesium oxide, in tablet form, is very inexpensive, does not require a prescription, and may be considered as very reasonable prevention in those who have a history of aura, menstrually related migraine, no health insurance, or who may become pregnant. Because of the excellent safety profile of magnesium, any patient who has frequent migraines and is considering a preventive strategy to reduce the frequency or severity of their headaches may want to consider this option and discuss it with their physician.  Carin Primrose, MD  [facility], [place], [place]    Cc: Qu[place]cene, Samul Dada, MD  Sarina Ill, MD  [facility] [address] [place], [place] [address]  Phone [phone] Fax [phone]  I spent more than 100 minutes  minutes of face-to-face and non-face-to-face time with patient on the  1. Ocular migraine   2. Monocular vision loss   3. Nutritional deficiency   4. B12 deficiency   5. Vitamin B6 deficiency   6. Vitamin D deficiency   7. Sudden visual loss of left eye   8. Left temporal headache   9. Dissection of left carotid artery (HCC)    diagnosis.  This included previsit chart review, lab review, study review, order entry, electronic health record documentation, patient education on the different diagnostic and therapeutic options, counseling and coordination of care, risks and benefits of management, compliance, or risk factor reduction

## 2020-05-14 NOTE — Telephone Encounter (Signed)
Pt aware of pap and need to repeat in 1 year

## 2020-05-14 NOTE — Telephone Encounter (Signed)
Needs to talk to [facility] about her Pap and HPV

## 2020-05-14 NOTE — Telephone Encounter (Signed)
Patient returned call. Advised patient had reviewed pap smear results. Pap did show LSIL +HPV but per ASCCP guidelines would need repeat pap smear in one year. Patient voiced understanding.

## 2020-05-14 NOTE — Telephone Encounter (Signed)
Left message that pap was LSIL with +HPV repeat in 1 year( per ASCCP guidelines CIN 3+5 year risk is 6%, had No visible dysplasia on colpo 2019)

## 2020-05-15 ENCOUNTER — Encounter: Payer: Self-pay | Admitting: *Deleted

## 2020-05-15 ENCOUNTER — Encounter: Payer: Self-pay | Admitting: Neurology

## 2020-05-15 ENCOUNTER — Telehealth: Payer: Self-pay | Admitting: Neurology

## 2020-05-15 ENCOUNTER — Ambulatory Visit (INDEPENDENT_AMBULATORY_CARE_PROVIDER_SITE_OTHER): Payer: Medicare Other | Admitting: Neurology

## 2020-05-15 VITALS — BP 150/83 | HR 77 | Ht 67.0 in | Wt 129.0 lb

## 2020-05-15 DIAGNOSIS — G43109 Migraine with aura, not intractable, without status migrainosus: Secondary | ICD-10-CM

## 2020-05-15 DIAGNOSIS — E639 Nutritional deficiency, unspecified: Secondary | ICD-10-CM | POA: Diagnosis not present

## 2020-05-15 DIAGNOSIS — E538 Deficiency of other specified B group vitamins: Secondary | ICD-10-CM

## 2020-05-15 DIAGNOSIS — R519 Headache, unspecified: Secondary | ICD-10-CM

## 2020-05-15 DIAGNOSIS — H5460 Unqualified visual loss, one eye, unspecified: Secondary | ICD-10-CM

## 2020-05-15 DIAGNOSIS — E531 Pyridoxine deficiency: Secondary | ICD-10-CM

## 2020-05-15 DIAGNOSIS — H53132 Sudden visual loss, left eye: Secondary | ICD-10-CM

## 2020-05-15 DIAGNOSIS — I7771 Dissection of carotid artery: Secondary | ICD-10-CM

## 2020-05-15 DIAGNOSIS — E559 Vitamin D deficiency, unspecified: Secondary | ICD-10-CM

## 2020-05-15 MED ORDER — ALPRAZOLAM 0.25 MG PO TABS: ORAL_TABLET | ORAL | 0 refills | Status: DC

## 2020-05-15 MED ORDER — NURTEC 75 MG PO TBDP: 75.0000 mg | ORAL_TABLET | Freq: Every day | ORAL | 6 refills | Status: AC | PRN

## 2020-05-15 NOTE — Telephone Encounter (Signed)
Pt called, forgot to get note for daughter's employer during appt Pt stated need to note to say Stormy Fabian drove mother to appointment. Pt ask if letter could be faxed to employer [phone].. Transferred Pt to Medical Records.

## 2020-05-15 NOTE — Patient Instructions (Addendum)
MRI brain and blood vessels of the head and neck Xanax for the MRI machine (can cause drowsiness, you need a driver to the imaging) Blood work today Magnesium citrate 400mg  to 600mg  daily, riboflavin 400mg  daily, Coenzyme Q 10 100mg  three times daily Nurtec as needed   Rimegepant oral dissolving tablet What is this medicine? RIMEGEPANT (ri ME je pant) is used to treat migraine headaches with or without aura. An aura is a strange feeling or visual disturbance that warns you of an attack. It is not used to prevent migraines. This medicine may be used for other purposes; ask your health care provider or pharmacist if you have questions. COMMON BRAND NAME(S): NURTEC ODT What should I tell my health care provider before I take this medicine? They need to know if you have any of these conditions:  kidney disease  liver disease  an unusual or allergic reaction to rimegepant, other medicines, foods, dyes, or preservatives  pregnant or trying to get pregnant  breast-feeding How should I use this medicine? Take the medicine by mouth. Follow the directions on the prescription label. Leave the tablet in the sealed blister pack until you are ready to take it. With dry hands, open the blister and gently remove the tablet. If the tablet breaks or crumbles, throw it away and take a new tablet out of the blister pack. Place the tablet in the mouth and allow it to dissolve, and then swallow. Do not cut, crush, or chew this medicine. You do not need water to take this medicine. Talk to your pediatrician about the use of this medicine in children. Special care may be needed. Overdosage: If you think you have taken too much of this medicine contact a poison control center or emergency room at once. NOTE: This medicine is only for you. Do not share this medicine with others. What if I miss a dose? This does not apply. This medicine is not for regular use. What may interact with this medicine? This medicine  may interact with the following medications:  certain medicines for fungal infections like fluconazole, itraconazole  rifampin This list may not describe all possible interactions. Give your health care provider a list of all the medicines, herbs, non-prescription drugs, or dietary supplements you use. Also tell them if you smoke, drink alcohol, or use illegal drugs. Some items may interact with your medicine. What should I watch for while using this medicine? Visit your health care professional for regular checks on your progress. Tell your health care professional if your symptoms do not start to get better or if they get worse. What side effects may I notice from receiving this medicine? Side effects that you should report to your doctor or health care professional as soon as possible:  allergic reactions like skin rash, itching or hives; swelling of the face, lips, or tongue Side effects that usually do not require medical attention (report these to your doctor or health care professional if they continue or are bothersome):  nausea This list may not describe all possible side effects. Call your doctor for medical advice about side effects. You may report side effects to FDA at 1-800-FDA-1088. Where should I keep my medicine? Keep out of the reach of children. Store at room temperature between 15 and 30 degrees C (59 and 86 degrees F). Throw away any unused medicine after the expiration date. NOTE: This sheet is a summary. It may not cover all possible information. If you have questions about this medicine, talk  to your doctor, pharmacist, or health care provider.  2020 Elsevier/Gold Standard (2018-11-01 00:21:31) Alprazolam tablets What is this medicine? ALPRAZOLAM (al PRAY zoe lam) is a benzodiazepine. It is used to treat anxiety and panic attacks. This medicine may be used for other purposes; ask your health care provider or pharmacist if you have questions. COMMON BRAND NAME(S):  Xanax What should I tell my health care provider before I take this medicine? They need to know if you have any of these conditions:  an alcohol or drug abuse problem  bipolar disorder, depression, psychosis or other mental health conditions  glaucoma  kidney or liver disease  lung or breathing disease  myasthenia gravis  Parkinson's disease  porphyria  seizures or a history of seizures  suicidal thoughts  an unusual or allergic reaction to alprazolam, other benzodiazepines, foods, dyes, or preservatives  pregnant or trying to get pregnant  breast-feeding How should I use this medicine? Take this medicine by mouth with a glass of water. Follow the directions on the prescription label. Take your medicine at regular intervals. Do not take it more often than directed. Do not stop taking except on your doctor's advice. A special MedGuide will be given to you by the pharmacist with each prescription and refill. Be sure to read this information carefully each time. Talk to your pediatrician regarding the use of this medicine in children. Special care may be needed. Overdosage: If you think you have taken too much of this medicine contact a poison control center or emergency room at once. NOTE: This medicine is only for you. Do not share this medicine with others. What if I miss a dose? If you miss a dose, take it as soon as you can. If it is almost time for your next dose, take only that dose. Do not take double or extra doses. What may interact with this medicine? Do not take this medicine with any of the following medications:  certain antiviral medicines for HIV or AIDS like delavirdine, indinavir  certain medicines for fungal infections like ketoconazole and itraconazole  narcotic medicines for cough  sodium oxybate This medicine may also interact with the following medications:  alcohol  antihistamines for allergy, cough and cold  certain antibiotics like  clarithromycin, erythromycin, isoniazid, rifampin, rifapentine, rifabutin, and troleandomycin  certain medicines for blood pressure, heart disease, irregular heart beat  certain medicines for depression, like amitriptyline, fluoxetine, sertraline  certain medicines for seizures like carbamazepine, oxcarbazepine, phenobarbital, phenytoin, primidone  cimetidine  cyclosporine  female hormones, like estrogens or progestins and birth control pills, patches, rings, or injections  general anesthetics like halothane, isoflurane, methoxyflurane, propofol  grapefruit juice  local anesthetics like lidocaine, pramoxine, tetracaine  medicines that relax muscles for surgery  narcotic medicines for pain  other antiviral medicines for HIV or AIDS  phenothiazines like chlorpromazine, mesoridazine, prochlorperazine, thioridazine This list may not describe all possible interactions. Give your health care provider a list of all the medicines, herbs, non-prescription drugs, or dietary supplements you use. Also tell them if you smoke, drink alcohol, or use illegal drugs. Some items may interact with your medicine. What should I watch for while using this medicine? Tell your doctor or health care professional if your symptoms do not start to get better or if they get worse. Do not stop taking except on your doctor's advice. You may develop a severe reaction. Your doctor will tell you how much medicine to take. You may get drowsy or dizzy. Do not drive, use  machinery, or do anything that needs mental alertness until you know how this medicine affects you. To reduce the risk of dizzy and fainting spells, do not stand or sit up quickly, especially if you are an older patient. Alcohol may increase dizziness and drowsiness. Avoid alcoholic drinks. If you are taking another medicine that also causes drowsiness, you may have more side effects. Give your health care provider a list of all medicines you use. Your  doctor will tell you how much medicine to take. Do not take more medicine than directed. Call emergency for help if you have problems breathing or unusual sleepiness. What side effects may I notice from receiving this medicine? Side effects that you should report to your doctor or health care professional as soon as possible:  allergic reactions like skin rash, itching or hives, swelling of the face, lips, or tongue  breathing problems  confusion  loss of balance or coordination  signs and symptoms of low blood pressure like dizziness; feeling faint or lightheaded, falls; unusually weak or tired  suicidal thoughts or other mood changes Side effects that usually do not require medical attention (report to your doctor or health care professional if they continue or are bothersome):  dizziness  dry mouth  nausea, vomiting  tiredness This list may not describe all possible side effects. Call your doctor for medical advice about side effects. You may report side effects to FDA at 1-800-FDA-1088. Where should I keep my medicine? Keep out of the reach of children. This medicine can be abused. Keep your medicine in a safe place to protect it from theft. Do not share this medicine with anyone. Selling or giving away this medicine is dangerous and against the law. Store at room temperature between 20 and 25 degrees C (68 and 77 degrees F). This medicine may cause accidental overdose and death if taken by other adults, children, or pets. Mix any unused medicine with a substance like cat litter or coffee grounds. Then throw the medicine away in a sealed container like a sealed bag or a coffee can with a lid. Do not use the medicine after the expiration date. NOTE: This sheet is a summary. It may not cover all possible information. If you have questions about this medicine, talk to your doctor, pharmacist, or health care provider.  2020 Elsevier/Gold Standard (2015-05-17  13:47:25)     Published: June 15, 2012 The ideal medication for prevention and treatment of migraine would have no side effects, no risk, would be safe in pregnancy, as well as being highly effective while remaining inexpensive. Of course, no such medication exists, but magnesium comes closer than many interventions on all these fronts.  Magnesium oxide is frequently used in pill form to prevent migraine, usually at a dose of 400-500 mg per day. Acutely, it can be dosed in pill form at the same dosage, or given intravenously as magnesium sulfate at 1-2 gm. The most frequent side effect is diarrhea, which can be helpful in those prone to constipation. The diarrhea and abdominal cramping that is sometimes experienced is dose responsive, such that a lower dose or decreasing the frequency of intake usually takes care of the problem.  Magnesium oxide in doses up to 400 mg is pregnancy category A, which means it can be used safely in pregnancy. Magnesium sulfate, typically given intravenously, now carries a warning related to bone thinning seen in the developing fetus when used longer than 5-7 days in a row. This was discovered in the  context of high doses being given to pregnant women to prevent preterm labor.  The strongest evidence for magnesium's effectiveness is in patients who have, or have had, aura with their migraines. It is believed magnesium may prevent the wave of brain signaling, called cortical spreading depression, which produces the visual and sensory changes that are the common forms of aura. Other mechanisms of magnesium action include improved platelet function and decreased release or blocking of pain transmitting chemicals in the brain such as Substance P and glutamate. Magnesium may also prevent narrowing of brain blood vessels caused by the neurotransmitter serotonin.  Daily oral magnesium has also been shown to be effective in preventing menstrually related migraine, especially in  those with premenstrual migraine. This means that preventive use can be targeted at those with aura and/or those with menstrually related migraine.  It is difficult to measure magnesium levels accurately, as levels in the blood stream may represent only 2% of total body stores, with the rest of magnesium stored in the bones or within cells. Most importantly, simple magnesium blood levels do not give an accurate measure of magnesium levels in the brain. This has led to uncertainty concerning whether correcting a low magnesium level is necessary in treatment, or whether magnesium effectiveness is even related to low blood levels in the first place. Measurement of ionized magnesium or red blood cell magnesium levels is thought to possibly be more accurate, but these laboratory tests but are more difficult and expensive to obtain.  Because magnesium may not be accurately measured, low magnesium in the brain can be difficult to prove. Those prone to low magnesium include people with heart disease, diabetes, alcoholism, and those on diuretics for blood pressure. There is some evidence that migraineurs may have lower levels of brain magnesium either from decreased absorption of it in food, a genetic tendency to low brain magnesium, or from excreting it from the body to a greater degree than non-migraineurs. Studies of migraineurs have found low levels of brain and spinal fluid magnesium in between migraine attacks.  In 2012, the American Headache Society and the [place] [employer] of Neu[employer] reviewed the studies on medications used for migraine prevention and gave magnesium a Level B rating, that is, it is probably effective and should be considered for patients requiring migraine preventive therapy. Because of its safety profile and the lack of serious side effects, magnesium is often chosen as a preventive strategy either alone, or with other preventive medications.  Magnesium has also been studied for the acute,  as-needed treatment of severe, difficult-to-treat migraine. Magnesium sulfate given intravenously was found to be most effective in those with a history of migraine with aura. In those without a history of aura, no difference was seen in immediate pain relief or nausea relief by magnesium, but there was less light and noise sensitivity after the infusion.  Magnesium oxide, in tablet form, is very inexpensive, does not require a prescription, and may be considered as very reasonable prevention in those who have a history of aura, menstrually related migraine, no health insurance, or who may become pregnant. Because of the excellent safety profile of magnesium, any patient who has frequent migraines and is considering a preventive strategy to reduce the frequency or severity of their headaches may want to consider this option and discuss it with their physician.  Carin Primrose, MD  [facility], [place], [place]

## 2020-05-15 NOTE — Telephone Encounter (Signed)
medicare/medicaid order sent to [facility]. No auth they will reach out to the patient to schedule.  °

## 2020-05-16 ENCOUNTER — Telehealth: Payer: Self-pay | Admitting: Neurology

## 2020-05-16 NOTE — Telephone Encounter (Signed)
Called the pt and reviewed her labs as normal. Informed her no abnormal findings so far. Informed her that if something comes back abnormal we will contact her. She was appreciative and verbalized understanding.

## 2020-05-16 NOTE — Telephone Encounter (Signed)
-----   Message from Melvenia Beam, MD sent at 05/16/2020 11:42 AM EDT ----- Labs look fine so far, if any of the rest come back abnormal we will let you know thanks

## 2020-05-16 NOTE — Telephone Encounter (Signed)
Patient would like to have her MRI's at [facility].. patient is scheduled for 05/28/20 to arrive at 2:30 pm. I spoke with the patient she is aware of time and day. I also gave her their number of [phone] incase she needed to r/s.

## 2020-05-18 ENCOUNTER — Other Ambulatory Visit: Payer: Self-pay | Admitting: Adult Health

## 2020-05-21 ENCOUNTER — Other Ambulatory Visit: Payer: Self-pay | Admitting: Adult Health

## 2020-05-21 ENCOUNTER — Telehealth: Payer: Self-pay | Admitting: *Deleted

## 2020-05-21 NOTE — Telephone Encounter (Signed)
Nurtec PA completed on Cover My Meds. Key: [id number]. Awaiting on [employer] determination. Triptans are contraindicated in ocular migraines.   "If [employer] has not responded in 1-3 days or if you have any questions about your ePA request, please contact [employer] at [phone]. If you think there may be a problem with your PA request, use our live chat feature at the bottom right."

## 2020-05-21 NOTE — Telephone Encounter (Signed)
Per [employer] My Med[employer], Type of coverage approved: Non-Formulary This approval authorizes your coverage from 02/21/2020 - 05/21/2021.  Approval letter printed from [employer] and faxed to pt's pharmacy, [facility]. Received a receipt of confirmation.

## 2020-05-22 ENCOUNTER — Telehealth: Payer: Self-pay | Admitting: *Deleted

## 2020-05-22 NOTE — Telephone Encounter (Signed)
-----   Message from Melvenia Beam, MD sent at 05/22/2020  9:29 AM EDT ----- Labs all look fine thanks

## 2020-05-22 NOTE — Telephone Encounter (Signed)
Spoke with pt and let her know so far labs are fine, no concerns. Will notify if remaining are concerning. Pt verbalized understanding. Her MRI is on 9/27 and she also stated she picked up the Nurtec. Her questions were answered.

## 2020-05-28 ENCOUNTER — Other Ambulatory Visit: Payer: Self-pay

## 2020-05-28 ENCOUNTER — Ambulatory Visit (HOSPITAL_COMMUNITY)
Admission: RE | Admit: 2020-05-28 | Discharge: 2020-05-28 | Disposition: A | Discharge status: Home / Self Care | Payer: Medicare Other | Source: Ambulatory Visit | Attending: Neurology | Admitting: Neurology

## 2020-05-28 DIAGNOSIS — I7771 Dissection of carotid artery: Secondary | ICD-10-CM

## 2020-05-28 DIAGNOSIS — H53132 Sudden visual loss, left eye: Secondary | ICD-10-CM | POA: Diagnosis present

## 2020-05-28 DIAGNOSIS — R519 Headache, unspecified: Secondary | ICD-10-CM

## 2020-05-28 DIAGNOSIS — H5460 Unqualified visual loss, one eye, unspecified: Secondary | ICD-10-CM | POA: Diagnosis present

## 2020-05-28 MED ORDER — GADOBUTROL 1 MMOL/ML IV SOLN
5.0000 mL | Freq: Once | INTRAVENOUS | Status: AC | PRN
  Administered 2020-05-28: 5 mL via INTRAVENOUS

## 2020-05-29 ENCOUNTER — Telehealth: Payer: Self-pay | Admitting: *Deleted

## 2020-05-29 LAB — COMPREHENSIVE METABOLIC PANEL
ALT: 13 IU/L (ref 0–32)
AST: 19 IU/L (ref 0–40)
Albumin/Globulin Ratio: 1.7 (ref 1.2–2.2)
Albumin: 4.6 g/dL (ref 3.8–4.8)
Alkaline Phosphatase: 92 IU/L (ref 44–121)
BUN/Creatinine Ratio: 24 (ref 12–28)
BUN: 14 mg/dL (ref 8–27)
Bilirubin Total: 0.5 mg/dL (ref 0.0–1.2)
CO2: 30 mmol/L — ABNORMAL HIGH (ref 20–29)
Calcium: 9.7 mg/dL (ref 8.7–10.3)
Chloride: 99 mmol/L (ref 96–106)
Creatinine, Ser: 0.58 mg/dL (ref 0.57–1.00)
GFR calc Af Amer: 113 mL/min/{1.73_m2} (ref >59)
GFR calc non Af Amer: 98 mL/min/{1.73_m2} (ref >59)
Globulin, Total: 2.7 g/dL (ref 1.5–4.5)
Glucose: 98 mg/dL (ref 65–99)
Potassium: 4 mmol/L (ref 3.5–5.2)
Sodium: 143 mmol/L (ref 134–144)
Total Protein: 7.3 g/dL (ref 6.0–8.5)

## 2020-05-29 LAB — B12 AND FOLATE PANEL
Folate: 19.2 ng/mL (ref >3.0)
Vitamin B-12: >2000 pg/mL — ABNORMAL HIGH (ref 232–1245)

## 2020-05-29 LAB — CBC
Hematocrit: 44.2 % (ref 34.0–46.6)
Hemoglobin: 14.9 g/dL (ref 11.1–15.9)
MCH: 32.7 pg (ref 26.6–33.0)
MCHC: 33.7 g/dL (ref 31.5–35.7)
MCV: 97 fL (ref 79–97)
Platelets: 267 10*3/uL (ref 150–450)
RBC: 4.56 x10E6/uL (ref 3.77–5.28)
RDW: 11.8 % (ref 11.7–15.4)
WBC: 7.2 10*3/uL (ref 3.4–10.8)

## 2020-05-29 LAB — METHYLMALONIC ACID, SERUM: Methylmalonic Acid: 118 nmol/L (ref 0–378)

## 2020-05-29 LAB — VITAMIN A: Vitamin A: 44.1 ug/dL (ref 22.0–69.5)

## 2020-05-29 LAB — VITAMIN B1: Thiamine: 137.5 nmol/L (ref 66.5–200.0)

## 2020-05-29 LAB — VITAMIN B6: Vitamin B6: 20.5 ug/L (ref 2.0–32.8)

## 2020-05-29 LAB — C-REACTIVE PROTEIN: CRP: 2 mg/L (ref 0–10)

## 2020-05-29 LAB — SEDIMENTATION RATE: Sed Rate: 2 mm/hr (ref 0–40)

## 2020-05-29 LAB — MAGNESIUM: Magnesium: 2.2 mg/dL (ref 1.6–2.3)

## 2020-05-29 LAB — ZINC: Zinc: 104 ug/dL (ref 44–115)

## 2020-05-29 LAB — VITAMIN D 25 HYDROXY (VIT D DEFICIENCY, FRACTURES): Vit D, 25-Hydroxy: 88 ng/mL (ref 30.0–100.0)

## 2020-05-29 NOTE — Telephone Encounter (Signed)
-----   Message from Melvenia Beam, MD sent at 05/29/2020  8:06 AM EDT ----- MRI of the brain unemarkable. Unfortunately she moved a lot during the MRA so the images are blurry. so I would repeat using a CT head to get a better look at the blood vessels. Let me know if she agrees and I can order thanks.

## 2020-05-29 NOTE — Addendum Note (Signed)
Addended by: Sarina Ill B on: 05/29/2020 11:52 AM   Modules accepted: Orders

## 2020-05-29 NOTE — Telephone Encounter (Signed)
Spoke with pt and discussed MRI/MRA results. Pt was disappointed because she didn't realized she had moved. She is agreeable to doing a CT to check the vessels. Her questions were answered. She would like to have this completed at [facility] which is closer to her than [facility].

## 2020-05-29 NOTE — Telephone Encounter (Signed)
Patient is scheduled at [facility] for Thursday 06/21/20 arrival time is 8:45 AM. Patient is aware of time and day. She is also aware that she needs to be liquids only 4 hours prior to the exam.    Medicare/medicaid no auth;

## 2020-06-01 NOTE — Telephone Encounter (Signed)
Pt called, had headache yesterday, took Rimegepant Sulfate (NURTEC) 75 MG TBDP and, MAGNESIUM PO. The magnesium caused some diarrhea  Could Dr. Jaynee Eagles get me in any early for a MRI at [facility]? Would like a call from the nurse.

## 2020-06-04 NOTE — Telephone Encounter (Signed)
Spoke with patient. She stated she has had two headaches since her ov. She cut back a little on the Magnesium and "everything is fine now". Pt states she had watery diarrhea prior. Pt stated she had already called [facility] this AM and they did not have a sooner CT appt. Pt was advised she can call them again on another day to check for cancellations. The pt stated when she got the contrast for MRI her chest felt "a little funny". I advised she may feel flushed with contrast. Pt stated it didn't last long and she can't recall exactly how she felt.

## 2020-06-14 ENCOUNTER — Telehealth: Payer: Self-pay | Admitting: Cardiology

## 2020-06-14 ENCOUNTER — Other Ambulatory Visit: Payer: Self-pay | Admitting: Adult Health

## 2020-06-14 NOTE — Telephone Encounter (Signed)
Pt voiced understanding and appreciative  

## 2020-06-14 NOTE — Telephone Encounter (Signed)
Patient called stating that she was seen in the ER 05-28-2020 for loss of vision. States that she was told she may have blocked carotids . Patient is wanting to know if Dr. Harl Bowie can listen to her neck to see if her arteries are blocked. [phone]

## 2020-06-14 NOTE — Telephone Encounter (Signed)
She needed MRI which was done 9/27 but imagine was not clear and will have repeat MRI Tuesday at [facility] - wanted to see if Dr Harl Bowie thought she needed carotid US or appt sooner than scheduled

## 2020-06-15 ENCOUNTER — Other Ambulatory Visit: Payer: Self-pay | Admitting: Adult Health

## 2020-06-15 NOTE — Telephone Encounter (Signed)
The type of MRI that has been ordered actually will get a good look at the carotid arteries so would not need an [place]  April Abts MD

## 2020-06-18 ENCOUNTER — Ambulatory Visit (INDEPENDENT_AMBULATORY_CARE_PROVIDER_SITE_OTHER): Payer: Medicare Other | Admitting: Gastroenterology

## 2020-06-19 ENCOUNTER — Other Ambulatory Visit: Payer: Self-pay | Admitting: Adult Health

## 2020-06-19 NOTE — Telephone Encounter (Signed)
Pt voiced understanding

## 2020-06-21 ENCOUNTER — Other Ambulatory Visit: Payer: Self-pay

## 2020-06-21 ENCOUNTER — Ambulatory Visit (HOSPITAL_COMMUNITY)
Admission: RE | Admit: 2020-06-21 | Discharge: 2020-06-21 | Disposition: A | Discharge status: Home / Self Care | Payer: Medicare Other | Source: Ambulatory Visit | Attending: Neurology | Admitting: Neurology

## 2020-06-21 DIAGNOSIS — H5460 Unqualified visual loss, one eye, unspecified: Secondary | ICD-10-CM | POA: Diagnosis present

## 2020-06-21 DIAGNOSIS — R519 Headache, unspecified: Secondary | ICD-10-CM | POA: Diagnosis present

## 2020-06-21 DIAGNOSIS — H53132 Sudden visual loss, left eye: Secondary | ICD-10-CM | POA: Diagnosis present

## 2020-06-21 LAB — POCT I-STAT CREATININE: Creatinine, Ser: 0.7 mg/dL (ref 0.44–1.00)

## 2020-06-21 MED ORDER — IOHEXOL 350 MG/ML SOLN
75.0000 mL | Freq: Once | INTRAVENOUS | Status: AC | PRN
  Administered 2020-06-21: 75 mL via INTRAVENOUS

## 2020-06-25 NOTE — Telephone Encounter (Signed)
I spoke with the patient and discussed per [name] that the CTA looks fine and the findings on the MRI that we ordered the CT-A for were "artifact" and we discussed what that meant. The patient was very appreciative for the results. She is planning to discuss further questions about her diagnosis that she didn't quite understand at the next office visit in January. I let the pt know to call ahead sooner if needed. Pt stated her vision is better (but not completely resolved) and she is taking Magnesium, Riboflavin, and CoQ 10 daily. She also takes Nurtec as needed. She verbalized appreciation for the call.   Melvenia Beam, MD  06/21/2020 4:04 PM EDT     CTA looks fine, the MRA finding was "artifact" thanks

## 2020-07-11 ENCOUNTER — Telehealth: Payer: Self-pay | Admitting: Adult Health

## 2020-07-11 ENCOUNTER — Other Ambulatory Visit: Payer: Self-pay | Admitting: Adult Health

## 2020-07-11 NOTE — Telephone Encounter (Signed)
Pt called, concerned with a vaginal odor, she noticed it yesterday (offered appointment, pt states she just had her checkup here) States it is not a fish smell, just odd odor States she's not bleeding & is not having any pain Wonders if we can recommend anything Only thing that is new is she recently started taking a Vitamin B Complex  Please advise & call pt    [name]

## 2020-07-12 NOTE — Telephone Encounter (Signed)
Called to offer patient nurse visit for self swab & she declined, stated she's going to get her urine checked at her PCP office

## 2020-07-12 NOTE — Telephone Encounter (Signed)
Next available nurse visit is 11/17 in the afternoon, ok? Or bring in sooner? Please advise

## 2020-07-16 ENCOUNTER — Telehealth: Payer: Self-pay | Admitting: Neurology

## 2020-07-16 DIAGNOSIS — G43109 Migraine with aura, not intractable, without status migrainosus: Secondary | ICD-10-CM

## 2020-07-16 DIAGNOSIS — H5460 Unqualified visual loss, one eye, unspecified: Secondary | ICD-10-CM

## 2020-07-16 DIAGNOSIS — H53132 Sudden visual loss, left eye: Secondary | ICD-10-CM

## 2020-07-16 NOTE — Telephone Encounter (Signed)
Pt called, had bad migraine on Saturday 11/13. Could not barely see out of both eyes, saw zigzag lines, lasted 8 mins. I took Nurtec, Tylenol, Advil. Today not hurting so bad, but I fell like something is on my nose. Would like to inform the nurse.

## 2020-07-16 NOTE — Telephone Encounter (Signed)
Spoke with patient. She stated all day today she has felt like a hair on her face. Her headache is better. While on the phone she asked if [name] felt like she should see an eye doctor. Per [name], yes refer to ophthalmology, Dr Katy Fitch. [name] also aware of pt's migraine from Saturday. The pt's questions were answered regarding Nurtec. She was very appreciative for the call.   Referral to Dr Katy Fitch ordered.

## 2020-08-03 ENCOUNTER — Telehealth: Payer: Self-pay

## 2020-08-03 ENCOUNTER — Other Ambulatory Visit: Payer: Self-pay | Admitting: Adult Health

## 2020-08-03 NOTE — Telephone Encounter (Signed)
Pt thinks she possibly has a hemroid and can feel it externally and is have no pain no blood with BM  No pain when wiping, could potentially be a knot in her bottom area

## 2020-08-06 ENCOUNTER — Telehealth: Payer: Self-pay | Admitting: Adult Health

## 2020-08-06 NOTE — Telephone Encounter (Signed)
Left message that xanax refilled today can call me tomorrow after 8:30

## 2020-08-06 NOTE — Telephone Encounter (Signed)
Pt called to check on refill request we received on 08/03/2020 for her Xanax & to check status of message she left for [name] 08/03/2020 (worried because she never heard from [place])

## 2020-08-07 ENCOUNTER — Other Ambulatory Visit: Payer: Self-pay

## 2020-08-07 ENCOUNTER — Encounter (INDEPENDENT_AMBULATORY_CARE_PROVIDER_SITE_OTHER): Payer: Self-pay | Admitting: Gastroenterology

## 2020-08-07 ENCOUNTER — Ambulatory Visit (INDEPENDENT_AMBULATORY_CARE_PROVIDER_SITE_OTHER): Payer: Medicare Other | Admitting: Internal Medicine

## 2020-08-07 ENCOUNTER — Ambulatory Visit (INDEPENDENT_AMBULATORY_CARE_PROVIDER_SITE_OTHER): Payer: Medicare Other | Admitting: Gastroenterology

## 2020-08-07 ENCOUNTER — Other Ambulatory Visit: Payer: Self-pay | Admitting: Adult Health

## 2020-08-07 VITALS — BP 110/73 | HR 83 | Temp 98.6°F | Ht 67.0 in | Wt 126.0 lb

## 2020-08-07 DIAGNOSIS — R140 Abdominal distension (gaseous): Secondary | ICD-10-CM | POA: Diagnosis not present

## 2020-08-07 DIAGNOSIS — K581 Irritable bowel syndrome with constipation: Secondary | ICD-10-CM

## 2020-08-07 DIAGNOSIS — K589 Irritable bowel syndrome without diarrhea: Secondary | ICD-10-CM | POA: Insufficient documentation

## 2020-08-07 DIAGNOSIS — K219 Gastro-esophageal reflux disease without esophagitis: Secondary | ICD-10-CM

## 2020-08-07 MED ORDER — OMEPRAZOLE 20 MG PO CPDR: 20.0000 mg | DELAYED_RELEASE_CAPSULE | Freq: Every day | ORAL | 5 refills | Status: DC

## 2020-08-07 NOTE — Patient Instructions (Signed)
Continue omeprazole 20 mg qday Start taking Miralax 1 cap every day Start IBGard 1 tablet every 8-12 hours as needed for bloating

## 2020-08-07 NOTE — Progress Notes (Signed)
Maylon Peppers, M.D. Gastroenterology & Hepatology [facility] [address] [place], [place] [address]  Primary Care Physician: [name], MD [place] [address]  I will communicate my assessment and recommendations to the referring MD via EMR. "Note: Occasional unusual wording and randomly placed punctuation marks may result from the use of speech recognition technology to transcribe this document"  Problems: 1. GERD 2. IBS-C  History of Present Illness: April Knox is a 64 y.o. female with PMH GERD, IBS-C who presents for follow up of bloating and constipation.  The patient was last seen on 02/02/2020. At that time, the patient was given a prescription of omeprazole, was advised to take 40 mg every other day, while the other days she should be taking 20 mg.  Patient states she has been feeling bloated intermittently for the last week . Patient states that last week she has also felt some constipation. She usually has a BM every day but her last BM was yesterday and feels that she is still is to empty some.  Has had intermittent episodes that were similar throughout her life. She does not take any laxatives or stool softeners on a daily basis. The patient denies having any heartburn, nausea, vomiting, fever, chills, hematochezia, melena, hematemesis, abdominal distention, abdominal pain, diarrhea, jaundice, pruritus or weight loss.  She reports that she takes omeprazole 20 mg qday for management of her GERD, reported this controls her symptoms adequately.  Last EGD: 12/16/2019 - gastric polyps, no other findings Last Colonoscopy: October, 2017 revealing small tubular adenoma and sigmoid diverticulosis  FHx: neg for any gastrointestinal/liver disease, nlung cancer mother Social: neg smoking, alcohol or illicit drug use Surgical: non contributory   Past Medical History: Past Medical History:  Diagnosis  Date  . Abnormal Pap smear   . Abnormal Papanicolaou smear of cervix with positive human papilloma virus (HPV) test 04/06/2018   Pap was LSIL with +HPV, will need colpo____  . Anxiety   . Back pain 03/07/2014  . Cervical spine disease    Mild  . Chest pain   . Elevated cholesterol   . Ganglion cyst of left foot 12/06/2013  . H/O bilateral breast implants 03/03/2013  . Hematuria 03/28/2014  . Hot flashes 12/06/2013  . Hypertension   . Menopausal symptoms 12/06/2013  . Mental disorder    anxiety  . Migraine with visual aura   . Osteopenia after menopause 10/06/2018   DEXA 09/28/2018, osteopenia, take calcium vitamin D and stay active   . PSVT (paroxysmal supraventricular tachycardia) ([place])    Documented by event recorder  . Rectocele 03/03/2013  . RUQ pain 03/07/2014  . Vaginal discharge 02/24/2014  . Vaginal dryness, menopausal 12/06/2013  . Vaginal Pap smear, abnormal   . Yeast infection 02/24/2014    Past Surgical History: Past Surgical History:  Procedure Laterality Date  . BIOPSY N/A 12/16/2019   Procedure: BIOPSY;  Surgeon: Rogene Houston, MD;  [facility];  Service: Endoscopy;  Laterality: N/A;  . BREAST ENHANCEMENT SURGERY    . COLONOSCOPY N/A 06/25/2016   Procedure: COLONOSCOPY;  Surgeon: Rogene Houston, MD;  [facility];  Service: Endoscopy;  Laterality: N/A;  1:00-moved to [place] to notify pt  . ESOPHAGOGASTRODUODENOSCOPY (EGD) WITH PROPOFOL N/A 12/16/2019   Procedure: ESOPHAGOGASTRODUODENOSCOPY (EGD) WITH PROPOFOL;  Surgeon: Rogene Houston, MD;  [facility];  Service: Endoscopy;  Laterality: N/A;  830  . POLYPECTOMY  06/25/2016   Procedure: POLYPECTOMY;  Surgeon: Rogene Houston, MD;  [facility];  Service: Endoscopy;;  colon   . toe biopsy      Family History: Family History  Problem Relation Age of Onset  . Dementia Father   . Cancer Mother   . Migraines Mother   . Healthy Daughter   . Healthy Daughter   . Healthy  Daughter   . Cancer Maternal Grandmother   . Heart disease Maternal Grandmother   . Diabetes Maternal Grandfather   . Heart attack Paternal Grandmother     Social History: Social History   Tobacco Use  Smoking Status Never Smoker  Smokeless Tobacco Never Used   Social History   Substance and Sexual Activity  Alcohol Use No  . Alcohol/week: 0.0 standard drinks   Social History   Substance and Sexual Activity  Drug Use No    Allergies: Allergies  Allergen Reactions  . Fish Allergy Anaphylaxis and Swelling    Throat closes.  . Bactrim Ds [Sulfamethoxazole-Trimethoprim]     Hives   . Ciprocinonide [Fluocinolone] Hives    " made me feel awful"   . Codeine Other (See Comments)    DROPS PT. BLOOD PRESSURE  . Penicillins     Has patient had a PCN reaction causing immediate rash, facial/tongue/throat swelling, SOB or lightheadedness with hypotension:unsure Has patient had a PCN reaction causing severe rash involving mucus membranes or skin necrosis:unsure Has patient had a PCN reaction that required hospitalization:No Has patient had a PCN reaction occurring within the last 10 years:No If all of the above answers are "NO", then may proceed with Cephalosporin use. Childhood reaction   . Sulfa Antibiotics Hives, Diarrhea, Nausea Only and Rash    Medications: Current Outpatient Medications  Medication Sig Dispense Refill  . acetaminophen (TYLENOL) 500 MG tablet Take 500 mg by mouth every 6 (six) hours as needed (for pain).     Marland Kitchen ALPRAZolam (XANAX) 1 MG tablet TAKE ONE TABLET BY MOUTH TWICE DAILY AS NEEDED FOR ANXIETY 40 tablet 0  . cholecalciferol (VITAMIN D3) 25 MCG (1000 UNIT) tablet Take 1,000 Units by mouth daily.    . Coenzyme Q10 (CO Q 10 PO) Take by mouth. Takes sometimes    . hydrochlorothiazide (HYDRODIURIL) 25 MG tablet Take 25 mg by mouth daily.    Marland Kitchen levocetirizine (XYZAL) 5 MG tablet Take 5 mg by mouth daily. In the morning per the patient.    Marland Kitchen MAGNESIUM PO  Take by mouth.    . Multiple Vitamins-Minerals (ZINC PO) Take by mouth.    Marland Kitchen omeprazole (PRILOSEC) 20 MG capsule Take 1 capsule (20 mg total) by mouth 2 (two) times daily before a meal. 60 capsule 5  . Rimegepant Sulfate (NURTEC) 75 MG TBDP Take 75 mg by mouth daily as needed. Dispense 10 or max allowed by insurance. Triptans contraindicated due to retinal migraines 10 tablet 6   No current facility-administered medications for this visit.    Review of Systems: GENERAL: negative for malaise, night sweats HEENT: No changes in hearing or vision, no nose bleeds or other nasal problems. NECK: Negative for lumps, goiter, pain and significant neck swelling RESPIRATORY: Negative for cough, wheezing CARDIOVASCULAR: Negative for chest pain, leg swelling, palpitations, orthopnea GI: SEE HPI MUSCULOSKELETAL: Negative for joint pain or swelling, back pain, and muscle pain. SKIN: Negative for lesions, rash PSYCH: Negative for sleep disturbance, mood disorder and recent psychosocial stressors. HEMATOLOGY Negative for prolonged bleeding, bruising easily, and swollen nodes.  ENDOCRINE: Negative for cold or heat intolerance, polyuria, polydipsia and goiter. NEURO: negative for tremor, gait imbalance, syncope and seizures. The remainder of the review of systems is noncontributory.   Physical Exam: BP 110/73 (BP Location: Left Arm, Patient Position: Sitting, Cuff Size: Small)   Pulse 83   Temp 98.6 F (37 C) (Oral)   Ht 5\' 7"  (1.702 m)   Wt 126 lb (57.2 kg)   BMI 19.73 kg/m  GENERAL: The patient is AO x3, in no acute distress. HEENT: Head is normocephalic and atraumatic. EOMI are intact. Mouth is well hydrated and without lesions. NECK: Supple. No masses LUNGS: Clear to auscultation. No presence of rhonchi/wheezing/rales. Adequate chest expansion HEART: RRR, normal s1 and s2. ABDOMEN: Soft, nontender, no guarding, no peritoneal signs, and nondistended. BS +. No masses. EXTREMITIES: Without any  cyanosis, clubbing, rash, lesions or edema. NEUROLOGIC: AOx3, no focal motor deficit. SKIN: no jaundice, no rashes  Imaging/Labs: as above  I personally reviewed and interpreted the available labs, imaging and endoscopic files.  Impression and Plan: April Knox is a 64 y.o. female with PMH GERD, IBS-C who presents for follow up of bloating and constipation.  Patient has presented intermittent mild symptoms of bloating and constipation which I consider are related to IBS-C.  Denies having any red flag signs.  She is not currently taking any medications that could help relieve her symptomatology.  I advised her to start taking MiraLAX on a daily basis to improve her bowel movement frequency, she may also benefit from taking peppermint oil to decrease the severity of her bloating episodes.  She has presented adequate control of her GERD while on omeprazole 20 mg daily, will refill this prescription.  Patient understood and agreed.  - Continue omeprazole 20 mg qday - Start taking Miralax 1 cap every day - Start IBGard 1 tablet every 8-12 hours as needed for bloating - RTC 1 year  All questions were answered.      Harvel Quale, MD Gastroenterology and Hepatology [facility]

## 2020-08-09 ENCOUNTER — Ambulatory Visit (INDEPENDENT_AMBULATORY_CARE_PROVIDER_SITE_OTHER): Payer: Medicare Other | Admitting: Gastroenterology

## 2020-08-21 ENCOUNTER — Other Ambulatory Visit: Payer: Self-pay | Admitting: Adult Health

## 2020-08-22 ENCOUNTER — Other Ambulatory Visit: Payer: Self-pay | Admitting: Adult Health

## 2020-09-04 ENCOUNTER — Telehealth: Payer: Self-pay | Admitting: Family Medicine

## 2020-09-04 DIAGNOSIS — L570 Actinic keratosis: Secondary | ICD-10-CM | POA: Diagnosis not present

## 2020-09-04 DIAGNOSIS — D1801 Hemangioma of skin and subcutaneous tissue: Secondary | ICD-10-CM | POA: Diagnosis not present

## 2020-09-04 DIAGNOSIS — D239 Other benign neoplasm of skin, unspecified: Secondary | ICD-10-CM | POA: Diagnosis not present

## 2020-09-04 NOTE — Telephone Encounter (Signed)
Patient called states she can't come in the office on Monday 09/10/20 at 8:00 AM. She states that she can do a telephone visit but she cant do a virtual visit because she doesn't have a smart phone, tablet or a laptop.

## 2020-09-04 NOTE — Telephone Encounter (Signed)
If she is doing well, we can reschedule for a time that she can come in. If she needs to do a televisit, we can accommodate this visit then bring her back in the office for the next visit. TY.

## 2020-09-05 NOTE — Telephone Encounter (Signed)
Spoke to pt and she states that she has had 2-3 episodes, but feels like she is better taking the Mg and B vitamins. Moved appt to 11-28-20 at 1030.  She will call us back sooner if needed.  She verbalized understanding of plan.

## 2020-09-10 ENCOUNTER — Ambulatory Visit: Payer: Medicare Other | Admitting: Family Medicine

## 2020-09-12 ENCOUNTER — Other Ambulatory Visit: Payer: Self-pay | Admitting: Adult Health

## 2020-09-18 DIAGNOSIS — I10 Essential (primary) hypertension: Secondary | ICD-10-CM | POA: Diagnosis not present

## 2020-09-18 DIAGNOSIS — J310 Chronic rhinitis: Secondary | ICD-10-CM | POA: Diagnosis not present

## 2020-09-18 DIAGNOSIS — G43911 Migraine, unspecified, intractable, with status migrainosus: Secondary | ICD-10-CM | POA: Diagnosis not present

## 2020-09-19 ENCOUNTER — Telehealth: Payer: Self-pay

## 2020-09-19 NOTE — Telephone Encounter (Signed)
Pt stated that she notices a slight odor (different than normal, not fishy), no pain or discomfort. Pt is wondering about a douche or what she can do OTC or if she needs to be seen.

## 2020-09-19 NOTE — Telephone Encounter (Signed)
Has vaginal odor, not fishy, no itching or burning, increase water, do not douche and call me back if any changes

## 2020-09-20 ENCOUNTER — Telehealth: Payer: Self-pay | Admitting: Adult Health

## 2020-09-20 NOTE — Telephone Encounter (Signed)
patient called and left a message stating that she spoke with [name] and she forgot to ask her one more question. Please contact pt

## 2020-09-20 NOTE — Telephone Encounter (Signed)
Patient  Wants Derrek Monaco to call her back to discuss a personal matter..Patient didn't give a reason. Clinical staff will follow up with patient.

## 2020-09-21 NOTE — Telephone Encounter (Addendum)
Pt has had some discomfort in pelvic area. It's not bad. Pt has some pain during sex. Pt states she talked to [place] this week and Jenn asked if she had any discomfort. She told her she hadn't had any pain but the more she thought about it, she realized she was having some discomfort. Please advise. Thanks!!

## 2020-09-24 NOTE — Telephone Encounter (Signed)
Amariz has had some cramping low, going to PCP for urine check now, if negative and cramping persists can make appt to be seen if desired.

## 2020-09-25 ENCOUNTER — Ambulatory Visit (INDEPENDENT_AMBULATORY_CARE_PROVIDER_SITE_OTHER): Payer: Medicare Other | Admitting: Adult Health

## 2020-09-25 ENCOUNTER — Other Ambulatory Visit (HOSPITAL_COMMUNITY)
Admission: RE | Admit: 2020-09-25 | Discharge: 2020-09-25 | Disposition: A | Discharge status: Home / Self Care | Payer: Medicare Other | Source: Ambulatory Visit | Attending: Adult Health | Admitting: Adult Health

## 2020-09-25 ENCOUNTER — Encounter: Payer: Self-pay | Admitting: Adult Health

## 2020-09-25 ENCOUNTER — Other Ambulatory Visit: Payer: Self-pay

## 2020-09-25 VITALS — BP 131/82 | HR 71 | Ht 67.0 in | Wt 125.0 lb

## 2020-09-25 DIAGNOSIS — N898 Other specified noninflammatory disorders of vagina: Secondary | ICD-10-CM | POA: Diagnosis not present

## 2020-09-25 DIAGNOSIS — R109 Unspecified abdominal pain: Secondary | ICD-10-CM

## 2020-09-25 MED ORDER — METRONIDAZOLE 500 MG PO TABS: 500.0000 mg | ORAL_TABLET | Freq: Two times a day (BID) | ORAL | 0 refills | Status: DC

## 2020-09-25 NOTE — Progress Notes (Signed)
  Subjective:     [name], female   DOB: December 20, 1955, 65 y.o.   MRN: [id number]  HPI April Knox is a 65 year old white female, widowed, PM in complaining of vaginal odor and some cramping. PCP is Dr Sherrie Sport  Review of Systems +vaginal odor +cramps, low Some pain with sex occasionally  Reviewed past medical,surgical, social and family history. Reviewed medications and allergies.     Objective:   Physical Exam BP 131/82 (BP Location: Left Arm, Patient Position: Sitting, Cuff Size: Normal)   Pulse 71   Ht 5\' 7"  (1.702 m)   Wt 125 lb (56.7 kg)   BMI 19.58 kg/m  Skin warm and dry.Pelvic: external genitalia is normal in appearance no lesions, vagina: white discharge with odor,urethra has no lesions or masses noted, cervix:smooth, uterus: normal size, shape and contour, non tender, no masses felt, adnexa: no masses or tenderness noted. Bladder is non tender and no masses felt. CV swab obtained.   Fall risk is low  Upstream - 09/25/20 1215      Pregnancy Intention Screening   Does the patient want to become pregnant in the next year? N/A    Does the patient's partner want to become pregnant in the next year? N/A    Would the patient like to discuss contraceptive options today? N/A      Contraception Wrap Up   Current Method No Method - Other Reason   postmenopausal   End Method No Method - Other Reason   postmenopausal   Contraception Counseling Provided No         Examination chaperoned by Marcie Bal young LPN  Assessment:    1. Vaginal odor CV swab sent Will rx flagyl Meds ordered this encounter  Medications  . metroNIDAZOLE (FLAGYL) 500 MG tablet    Sig: Take 1 tablet (500 mg total) by mouth 2 (two) times daily.    Dispense:  14 tablet    Refill:  0    Order Specific Question:   Supervising Provider    Answer:   Elonda Husky, LUTHER H [[id number]]     2. Vaginal discharge CV swab sent  3. Abdominal cramps     Plan:     No alcohol or sex during treatment Follow up  prn

## 2020-09-27 LAB — CERVICOVAGINAL ANCILLARY ONLY
Bacterial Vaginitis (gardnerella): POSITIVE — AB
Chlamydia: NEGATIVE
Comment: NEGATIVE
Comment: NEGATIVE
Comment: NEGATIVE
Comment: NEGATIVE
Comment: NEGATIVE
Comment: NORMAL
Neisseria Gonorrhea: NEGATIVE

## 2020-09-28 ENCOUNTER — Other Ambulatory Visit (INDEPENDENT_AMBULATORY_CARE_PROVIDER_SITE_OTHER): Payer: Self-pay

## 2020-09-28 ENCOUNTER — Telehealth: Payer: Self-pay

## 2020-09-28 NOTE — Telephone Encounter (Signed)
Patient calling for test results. Would like a call back. Please call mobile number.

## 2020-09-28 NOTE — Telephone Encounter (Signed)
Pt aware +BV, has flagyl, stop douching and tub baths with added soaps

## 2020-10-01 ENCOUNTER — Telehealth: Payer: Self-pay | Admitting: Adult Health

## 2020-10-01 NOTE — Telephone Encounter (Signed)
Patient wants April Knox to call her back( didn't give reason). Clinical staff will follow up with patient.

## 2020-10-02 ENCOUNTER — Other Ambulatory Visit: Payer: Self-pay | Admitting: Adult Health

## 2020-10-02 NOTE — Telephone Encounter (Signed)
Left message I called 

## 2020-10-05 ENCOUNTER — Telehealth: Payer: Self-pay | Admitting: Adult Health

## 2020-10-05 MED ORDER — METRONIDAZOLE 500 MG PO TABS: 500.0000 mg | ORAL_TABLET | Freq: Two times a day (BID) | ORAL | 0 refills | Status: DC

## 2020-10-05 NOTE — Addendum Note (Signed)
Addended by: Derrek Monaco A on: 10/05/2020 02:04 PM   Modules accepted: Orders

## 2020-10-05 NOTE — Telephone Encounter (Signed)
Still cramping, back aches Thinks she needs more meds Requesting call back from [facility]

## 2020-10-05 NOTE — Telephone Encounter (Signed)
Will refill flagyl

## 2020-10-08 ENCOUNTER — Other Ambulatory Visit: Payer: Self-pay | Admitting: Adult Health

## 2020-10-09 DIAGNOSIS — D239 Other benign neoplasm of skin, unspecified: Secondary | ICD-10-CM | POA: Diagnosis not present

## 2020-10-09 DIAGNOSIS — L570 Actinic keratosis: Secondary | ICD-10-CM | POA: Diagnosis not present

## 2020-10-19 ENCOUNTER — Telehealth: Payer: Self-pay | Admitting: Cardiology

## 2020-10-19 ENCOUNTER — Ambulatory Visit: Payer: Medicare Other | Admitting: Family Medicine

## 2020-10-19 NOTE — Telephone Encounter (Signed)
Reports calling PCP and reported having an irregular HR and was advised to start toprol xl 25 mg. Says she knows when its irregular because she feels palpitations. Denies chest pain or SOB. Says she has not checked her BP or HR. Advised that checking HR and BP would be helpful in measuring whether or not toprol xl 25 mg would be tolerable. Was unable to check BP or HR due to bad batteries in home machine. Gave sooner appointment 10/23/20 @10 :30 am with Katina Dung, NP. Advised to hold off on starting toprol xl 25 mg since she is unable to check vitals. Advised if symptoms get worse, to go to the ED. Verbalized understanding of plan.

## 2020-10-19 NOTE — Telephone Encounter (Signed)
Patient called stating that she has been under a lot of stress. Her PCP put her on TOPROL XL 25 MG. Patient is concerned about taking medication. Please call [phone].

## 2020-10-19 NOTE — Telephone Encounter (Signed)
Patient is requesting to be called on home # [phone]

## 2020-10-22 ENCOUNTER — Telehealth: Payer: Self-pay | Admitting: Adult Health

## 2020-10-22 DIAGNOSIS — R1011 Right upper quadrant pain: Secondary | ICD-10-CM

## 2020-10-22 DIAGNOSIS — R140 Abdominal distension (gaseous): Secondary | ICD-10-CM

## 2020-10-22 NOTE — Progress Notes (Signed)
Cardiology Office Note  Date: 10/23/2020   ID: April Knox, DOB 10-04-1955, MRN [id number]  PCP:  [name], MD  Cardiologist:  Carlyle Dolly, MD Electrophysiologist:  None   Chief Complaint: Cardiac follow-up  History of Present Illness: April Knox is a 65 y.o. female with a history of palpitations / PSVT, HLD, HTN.  Last encounter March 26, 2020 with Dr. Harl Bowie.  Interval history was unclear from available notes.  Previous notes mention PSVT documented on event recorder but no report and system.  EKGs were reviewed showing normal sinus rhythm.  Patient was having rare palpitations once or twice every 6 months which had been resistant to medications.  Blood pressure was at goal continuing current medications.  Telephone encounter on 10/19/2020 with staff.  Patient recorded calling her PCP and reported having irregular heart rate.  She was advised to start Toprol-XL 25 mg by PCP.  She was advised by heart care staff  to hold off on Toprol since she is unable to check vital signs.   She is here today for recent complaints of palpitations/irregular heart rate.  Her primary care provider ordered Toprol-XL 25 mg.  However she has not started taking the medication yet.  She was on a 14-day course of Flagyl for vaginal infection.  She states the palpitations seem to worsen during that time.  She denies any significant caffeine intake.  She drinks decaf or on caffeinated beverages.  No smoking or drug intake.  Denies any anginal symptoms.  She states when she gets stressed out she has tension in her shoulders and back.  She states during these episodes she has increased tension in her back and shoulders.  Currently denies any palpitations or discomfort in shoulders or back.  Denies any typical anginal symptoms or exertional symptoms.  No DOE/shortness of breath.  No orthostatic symptoms, no CVA or TIA-like symptoms.  No PND, orthopnea no bleeding in stool or urine.  No  claudication-like symptoms, DVT or PE-like symptoms, or lower extremity edema.  Past Medical History:  Diagnosis Date  . Abnormal Pap smear   . Abnormal Papanicolaou smear of cervix with positive human papilloma virus (HPV) test 04/06/2018   Pap was LSIL with +HPV, will need colpo____  . Anxiety   . Back pain 03/07/2014  . Cervical spine disease    Mild  . Chest pain   . Elevated cholesterol   . Ganglion cyst of left foot 12/06/2013  . H/O bilateral breast implants 03/03/2013  . Hematuria 03/28/2014  . Hot flashes 12/06/2013  . Hypertension   . Menopausal symptoms 12/06/2013  . Mental disorder    anxiety  . Migraine with visual aura   . Osteopenia after menopause 10/06/2018   DEXA 09/28/2018, osteopenia, take calcium vitamin D and stay active   . PSVT (paroxysmal supraventricular tachycardia) ([place])    Documented by event recorder  . Rectocele 03/03/2013  . RUQ pain 03/07/2014  . Vaginal discharge 02/24/2014  . Vaginal dryness, menopausal 12/06/2013  . Vaginal Pap smear, abnormal   . Yeast infection 02/24/2014    Past Surgical History:  Procedure Laterality Date  . BIOPSY N/A 12/16/2019   Procedure: BIOPSY;  Surgeon: Rogene Houston, MD;  [facility];  Service: Endoscopy;  Laterality: N/A;  . BREAST ENHANCEMENT SURGERY    . COLONOSCOPY N/A 06/25/2016   Procedure: COLONOSCOPY;  Surgeon: Rogene Houston, MD;  [facility];  Service: Endoscopy;  Laterality: N/A;  1:00-moved to  [place] to notify pt  . ESOPHAGOGASTRODUODENOSCOPY (EGD) WITH PROPOFOL N/A 12/16/2019   Procedure: ESOPHAGOGASTRODUODENOSCOPY (EGD) WITH PROPOFOL;  Surgeon: Rogene Houston, MD;  [facility];  Service: Endoscopy;  Laterality: N/A;  830  . POLYPECTOMY  06/25/2016   Procedure: POLYPECTOMY;  Surgeon: Rogene Houston, MD;  [facility];  Service: Endoscopy;;  colon   . toe biopsy      Current Outpatient Medications  Medication Sig Dispense Refill  . acetaminophen (TYLENOL) 500  MG tablet Take 500 mg by mouth every 6 (six) hours as needed (for pain).     Marland Kitchen ALPRAZolam (XANAX) 1 MG tablet TAKE ONE TABLET BY MOUTH TWICE DAILY AS NEEDED FOR ANXIETY 40 tablet 0  . Coenzyme Q10 (CO Q 10 PO) Take by mouth. Takes sometimes    . hydrochlorothiazide (HYDRODIURIL) 25 MG tablet Take 25 mg by mouth daily.    Marland Kitchen levocetirizine (XYZAL) 5 MG tablet Take 5 mg by mouth daily. In the morning per the patient.    . metroNIDAZOLE (FLAGYL) 500 MG tablet Take 1 tablet (500 mg total) by mouth 2 (two) times daily. 14 tablet 0  . omeprazole (PRILOSEC) 20 MG capsule Take 1 capsule (20 mg total) by mouth daily. 60 capsule 5  . Rimegepant Sulfate (NURTEC) 75 MG TBDP Take 75 mg by mouth daily as needed. Dispense 10 or max allowed by insurance. Triptans contraindicated due to retinal migraines 10 tablet 6   No current facility-administered medications for this visit.   Allergies:  Fish allergy, Bactrim ds [sulfamethoxazole-trimethoprim], Ciprocinonide [fluocinolone], Codeine, Penicillins, and Sulfa antibiotics   Social History: The patient  reports that she has never smoked. She has never used smokeless tobacco. She reports that she does not drink alcohol and does not use drugs.   Family History: The patient's family history includes Cancer in her maternal grandmother and mother; Dementia in her father; Diabetes in her maternal grandfather; Healthy in her daughter, daughter, and daughter; Heart attack in her paternal grandmother; Heart disease in her maternal grandmother; Migraines in her mother.   ROS:  Please see the history of present illness. Otherwise, complete review of systems is positive for none.  All other systems are reviewed and negative.   Physical Exam: VS:  BP 140/82   Pulse 70   Ht 5\' 7"  (1.702 m)   Wt 130 lb 3.2 oz (59.1 kg)   SpO2 98%   BMI 20.39 kg/m , BMI Body mass index is 20.39 kg/m.  Wt Readings from Last 3 Encounters:  10/23/20 130 lb 3.2 oz (59.1 kg)  09/25/20 125 lb  (56.7 kg)  08/07/20 126 lb (57.2 kg)    General: Patient appears comfortable at rest. Neck: Supple, no elevated JVP or carotid bruits, no thyromegaly. Lungs: Clear to auscultation, nonlabored breathing at rest. Cardiac: Regular rate and rhythm, no S3 or significant systolic murmur, no pericardial rub. Extremities: No pitting edema, distal pulses 2+. Skin: Warm and dry. Musculoskeletal: No kyphosis. Neuropsychiatric: Alert and oriented x3, affect grossly appropriate.  ECG:  An ECG dated 10/23/2020 was personally reviewed today and demonstrated:  Normal sinus rhythm rate of 68  Recent Labwork: 05/15/2020: ALT 13; AST 19; BUN 14; Hemoglobin 14.9; Magnesium 2.2; Platelets 267; Potassium 4.0; Sodium 143 06/21/2020: Creatinine, Ser 0.70     Component Value Date/Time   CHOL 200 (H) 11/11/2017 0937   TRIG 45 11/11/2017 0937   HDL 69 11/11/2017 0937   CHOLHDL 2.9 11/11/2017 0937   CHOLHDL 2.9  11/01/2013 1428   VLDL 13 11/01/2013 1428   LDLCALC 122 (H) 11/11/2017 1610    Other Studies Reviewed Today:   Assessment and Plan:  1. PSVT (paroxysmal supraventricular tachycardia) ([place])   2. Essential hypertension   3. Palpitations    1. PSVT (paroxysmal supraventricular tachycardia) ([facility]) History of PSVT.  Last seen by Dr. Harl Bowie March 06, 2020.  Previous notes mention PSVT documented on her previous event recorder but there was no report on his symptoms.  Patient was having rare palpitations once or twice every 6 months which had been resistant to medications.  2. Essential hypertension History of hypertension.  Currently taking HCTZ 25 mg daily.  Blood pressure is elevated today.  Patient states her systolic blood pressures are usually in the 120-130 range.  She states her blood pressure machine needs new batteries.  She is planning on getting this fixed.  Asked her to start checking her blood pressure daily and report blood pressures at or above 130/80 sustained.  3. Palpitations Recently  treated for bacterial vaginal infection with Flagyl.  She states the palpitations seem to originate during that time.  She denies any stimulant intake.  She drinks some occasional decaffeinated coffee and noncaffeinated beverages otherwise.  Denies any substance use.  No smoking.  No drinking.  Please get a 14-day ZIO monitor to assess palpitations.  She was given Toprol-XL 25 mg to take for palpitations but she was reticent to start the medication.  She was advised not to in a phone call with staff here due to not being able to assess her vital signs.  We will hold off on Toprol for now pending monitor results  Medication Adjustments/Labs and Tests Ordered: Current medicines are reviewed at length with the patient today.  Concerns regarding medicines are outlined above.   Disposition: Follow-up with Dr. Harl Bowie or APP 6 weeks  Signed, Levell July, NP 10/23/2020 11:37 AM    [facility] [place] [facility] [place], [place], [place] [address] Phone: [phone]; Fax: [phone]

## 2020-10-22 NOTE — Telephone Encounter (Signed)
Agree with your assessment and plan, hold on starting toprol until her evaluation   Zandra Abts MD

## 2020-10-22 NOTE — Telephone Encounter (Signed)
Pt called, requesting a call back from [facility], states that she finished the Flagyl & is now having abdominal pain, bloating, heart palpitations, hurting under her breast where a bra underwire would be  Thinks we need to order an ultrasound Please advise & call pt

## 2020-10-22 NOTE — Telephone Encounter (Signed)
Took flagyl now stomach hurts after eating, under ribs,  feels bloated, has had heart palpations and sees cardiologist in am.  No vomiting but bowels looser. Will get Abdomen US at [facility] 2/28 at 9:30 am be there 9:15 am NPO

## 2020-10-23 ENCOUNTER — Ambulatory Visit: Payer: Medicare Other

## 2020-10-23 ENCOUNTER — Ambulatory Visit (INDEPENDENT_AMBULATORY_CARE_PROVIDER_SITE_OTHER): Payer: Medicare Other | Admitting: Family Medicine

## 2020-10-23 ENCOUNTER — Other Ambulatory Visit: Payer: Self-pay | Admitting: Family Medicine

## 2020-10-23 ENCOUNTER — Encounter: Payer: Self-pay | Admitting: Family Medicine

## 2020-10-23 ENCOUNTER — Telehealth: Payer: Self-pay | Admitting: Family Medicine

## 2020-10-23 VITALS — BP 140/82 | HR 70 | Ht 67.0 in | Wt 130.2 lb

## 2020-10-23 DIAGNOSIS — I471 Supraventricular tachycardia, unspecified: Secondary | ICD-10-CM

## 2020-10-23 DIAGNOSIS — I10 Essential (primary) hypertension: Secondary | ICD-10-CM | POA: Diagnosis not present

## 2020-10-23 DIAGNOSIS — R002 Palpitations: Secondary | ICD-10-CM | POA: Diagnosis not present

## 2020-10-23 NOTE — Patient Instructions (Signed)
Medication Instructions:   Your physician recommends that you continue on your current medications as directed. Please refer to the Current Medication list given to you today.  Labwork:  none  Testing/Procedures: ZIO- Long Term Monitor Instructions   Your physician has requested you wear your ZIO patch monitor 14 days.   This is a single patch monitor.  [employer] supplies one patch monitor per enrollment.  Additional stickers are not available.   Please do not apply patch if you will be having a Nuclear Stress Test, Echocardiogram, Cardiac CT, MRI, or Chest Xray during the time frame you would be wearing the monitor. The patch cannot be worn during these tests.  You cannot remove and re-apply the ZIO XT patch monitor.   Your ZIO patch monitor will be sent USPS Priority mail from [facility] directly to your home address. The monitor may also be mailed to a PO BOX if home delivery is not available.   It may take 3-5 days to receive your monitor after you have been enrolled.   Once you have received you monitor, please review enclosed instructions.  Your monitor has already been registered assigning a specific monitor serial # to you.   Applying the monitor   Shave hair from upper left chest.   Hold abrader disc by orange tab.  Rub abrader in 40 strokes over left upper chest as indicated in your monitor instructions.   Clean area with 4 enclosed alcohol pads .  Use all pads to assure are is cleaned thoroughly.  Let dry.   Apply patch as indicated in monitor instructions.  Patch will be place under collarbone on left side of chest with arrow pointing upward.   Rub patch adhesive wings for 2 minutes.Remove white label marked "1".  Remove white label marked "2".  Rub patch adhesive wings for 2 additional minutes.   While looking in a mirror, press and release button in center of patch.  A small green light will flash 3-4 times .  This will be your only indicator the monitor has  been turned on.     Do not shower for the first 24 hours.  You may shower after the first 24 hours.   Press button if you feel a symptom. You will hear a small click.  Record Date, Time and Symptom in the Patient Log Book.   When you are ready to remove patch, follow instructions on last 2 pages of Patient Log Book.  Stick patch monitor onto last page of Patient Log Book.   Place Patient Log Book in [place] box.  Use locking tab on box and tape box closed securely.  The [employer] has [employer] on it.  Please place in mailbox as soon as possible.  Your physician should have your test results approximately 7 days after the monitor has been mailed back to [facility].   Call [facility] at [phone] if you have questions regarding your ZIO XT patch monitor.  Call them immediately if you see an orange light blinking on your monitor.    If your monitor falls off in less than 4 days contact our Monitor department at [phone].  If your monitor becomes loose or falls off after 4 days call [employer] at [phone] for suggestions on securing your monitor.  Follow-Up:  Your physician recommends that you schedule a follow-up appointment in: 6 weeks.  Any Other Special Instructions Will Be Listed Below (If Applicable).  If you need a refill  on your cardiac medications before your next appointment, please call your pharmacy.

## 2020-10-23 NOTE — Telephone Encounter (Signed)
Pre-cert Verification for the following procedure     14 Day ZIO XT dx: palpitations

## 2020-10-27 ENCOUNTER — Other Ambulatory Visit: Payer: Self-pay | Admitting: Adult Health

## 2020-10-29 ENCOUNTER — Ambulatory Visit (HOSPITAL_COMMUNITY)
Admission: RE | Admit: 2020-10-29 | Discharge: 2020-10-29 | Disposition: A | Discharge status: Home / Self Care | Payer: Medicare Other | Source: Ambulatory Visit | Attending: Adult Health | Admitting: Adult Health

## 2020-10-29 ENCOUNTER — Telehealth: Payer: Self-pay | Admitting: Adult Health

## 2020-10-29 ENCOUNTER — Other Ambulatory Visit: Payer: Self-pay

## 2020-10-29 ENCOUNTER — Ambulatory Visit: Payer: Medicare Other | Admitting: Family Medicine

## 2020-10-29 DIAGNOSIS — R1011 Right upper quadrant pain: Secondary | ICD-10-CM | POA: Diagnosis present

## 2020-10-29 DIAGNOSIS — R140 Abdominal distension (gaseous): Secondary | ICD-10-CM | POA: Diagnosis present

## 2020-10-29 NOTE — Telephone Encounter (Signed)
Spoke with April Knox @ [place] letting him know April Knox would like to see Xanax last 30 days. April Knox stated would be ok with refilling at 28 days. [name]

## 2020-10-29 NOTE — Telephone Encounter (Signed)
Amy with [name] has some questions on a Rx for this patient that [name] ordered

## 2020-10-29 NOTE — Telephone Encounter (Addendum)
Spoke with Amy @ [facility]. Pt is requesting a refill on Xanax. It was denied due to requesting to soon. How long do you want the 40 tabs to last? Please advise. Thanks!! [place]

## 2020-10-30 NOTE — Telephone Encounter (Signed)
Called pt to inform her that her refill request was submitted a few days too early. Will call on 3/2 for refill for pt. Pt confirms understanding.

## 2020-10-31 ENCOUNTER — Telehealth: Payer: Self-pay | Admitting: Adult Health

## 2020-10-31 NOTE — Telephone Encounter (Signed)
Pt aware that US showed small cyst in liver, other wise normal

## 2020-10-31 NOTE — Telephone Encounter (Signed)
Spoke with Derrek Monaco, NP regarding prescription refill and she stated that Xanax could only be refilled every 28 days and the pharmacy was aware. Pt informed to call her pharmacy next week for a refill.

## 2020-11-05 ENCOUNTER — Other Ambulatory Visit: Payer: Self-pay | Admitting: Adult Health

## 2020-11-13 ENCOUNTER — Other Ambulatory Visit: Payer: Self-pay | Admitting: Adult Health

## 2020-11-14 ENCOUNTER — Telehealth: Payer: Self-pay | Admitting: Neurology

## 2020-11-14 NOTE — Telephone Encounter (Signed)
Request Reference Number: [id number]. NURTEC TAB 75MG  ODT is approved through 08/31/2021.

## 2020-11-14 NOTE — Telephone Encounter (Signed)
PA submitted through [facility] [employer] [id number] Will await response

## 2020-11-23 ENCOUNTER — Telehealth: Payer: Self-pay | Admitting: Adult Health

## 2020-11-23 NOTE — Telephone Encounter (Signed)
April Knox has some bloating, has gained some weight in belly areaa, not related to simple liver cyst

## 2020-11-23 NOTE — Telephone Encounter (Signed)
Patient called stating that she is in some pain today and she remember that she had a CY scan not to long ago and it showed she had a spot on her liver and she would like to know if the pain is coming from that. Please contact pt

## 2020-11-27 NOTE — Progress Notes (Deleted)
Cardiology Office Note  Date: 11/27/2020   ID: April Knox, DOB 09/13/1955, MRN [id number]  PCP:  April Burly, MD  Cardiologist:  April Dolly, MD Electrophysiologist:  None   Chief Complaint: Cardiac follow-up  History of Present Illness: April Knox is a 65 y.o. female with a history of palpitations / PSVT, HLD, HTN.  Last encounter March 26, 2020 with Dr. Harl Knox.  Interval history was unclear from available notes.  Previous notes mention PSVT documented on event recorder but no report and system.  EKGs were reviewed showing normal sinus rhythm.  Patient was having rare palpitations once or twice every 6 months which had been resistant to medications.  Blood pressure was at goal continuing current medications.  Telephone encounter on 10/19/2020 with staff.  Patient recorded calling her PCP and reported having irregular heart rate.  She was advised to start Toprol-XL 25 mg by PCP.  She was advised by heart care staff  to hold off on Toprol since she is unable to check vital signs.   She is here today for recent complaints of palpitations/irregular heart rate.  Her primary care provider ordered Toprol-XL 25 mg.  However she has not started taking the medication yet.  She was on a 14-day course of Flagyl for vaginal infection.  She states the palpitations seem to worsen during that time.  She denies any significant caffeine intake.  She drinks decaf or on caffeinated beverages.  No smoking or drug intake.  Denies any anginal symptoms.  She states when she gets stressed out she has tension in her shoulders and back.  She states during these episodes she has increased tension in her back and shoulders.  Currently denies any palpitations or discomfort in shoulders or back.  Denies any typical anginal symptoms or exertional symptoms.  No DOE/shortness of breath.  No orthostatic symptoms, no CVA or TIA-like symptoms.  No PND, orthopnea no bleeding in stool or urine.  No  claudication-like symptoms, DVT or PE-like symptoms, or lower extremity edema.  Past Medical History:  Diagnosis Date  . Abnormal Pap smear   . Abnormal Papanicolaou smear of cervix with positive human papilloma virus (HPV) test 04/06/2018   Pap was LSIL with +HPV, will need colpo____  . Anxiety   . Back pain 03/07/2014  . Cervical spine disease    Mild  . Chest pain   . Elevated cholesterol   . Ganglion cyst of left foot 12/06/2013  . H/O bilateral breast implants 03/03/2013  . Hematuria 03/28/2014  . Hot flashes 12/06/2013  . Hypertension   . Menopausal symptoms 12/06/2013  . Mental disorder    anxiety  . Migraine with visual aura   . Osteopenia after menopause 10/06/2018   DEXA 09/28/2018, osteopenia, take calcium vitamin D and stay active   . PSVT (paroxysmal supraventricular tachycardia) ([place])    Documented by event recorder  . Rectocele 03/03/2013  . RUQ pain 03/07/2014  . Vaginal discharge 02/24/2014  . Vaginal dryness, menopausal 12/06/2013  . Vaginal Pap smear, abnormal   . Yeast infection 02/24/2014    Past Surgical History:  Procedure Laterality Date  . BIOPSY N/A 12/16/2019   Procedure: BIOPSY;  Surgeon: April Houston, MD;  [facility]: [facility];  Service: Endoscopy;  Laterality: N/A;  . BREAST ENHANCEMENT SURGERY    . COLONOSCOPY N/A 06/25/2016   Procedure: COLONOSCOPY;  Surgeon: April Houston, MD;  Location: [facility];  Service: Endoscopy;  Laterality: N/A;  1:00-moved to  [place] to notify pt  . ESOPHAGOGASTRODUODENOSCOPY (EGD) WITH PROPOFOL N/A 12/16/2019   Procedure: ESOPHAGOGASTRODUODENOSCOPY (EGD) WITH PROPOFOL;  Surgeon: April Houston, MD;  [facility]: [facility];  Service: Endoscopy;  Laterality: N/A;  830  . POLYPECTOMY  06/25/2016   Procedure: POLYPECTOMY;  Surgeon: April Houston, MD;  Location: [facility];  Service: Endoscopy;;  colon   . toe biopsy      Current Outpatient Medications  Medication Sig Dispense Refill  . acetaminophen (TYLENOL) 500  MG tablet Take 500 mg by mouth every 6 (six) hours as needed (for pain).     Marland Kitchen ALPRAZolam (XANAX) 1 MG tablet TAKE ONE TABLET BY MOUTH TWICE DAILY AS NEEDED FOR ANXIETY 40 tablet 0  . Coenzyme Q10 (CO Q 10 PO) Take by mouth. Takes sometimes    . fluticasone (FLONASE) 50 MCG/ACT nasal spray USE ONE SPRAY IN EACH NOSTRIL ONCE DAILY. SHAKE GENTLY BEFORE USING. 16 g 1  . hydrochlorothiazide (HYDRODIURIL) 25 MG tablet Take 25 mg by mouth daily.    Marland Kitchen levocetirizine (XYZAL) 5 MG tablet Take 5 mg by mouth daily. In the morning per the patient.    . metroNIDAZOLE (FLAGYL) 500 MG tablet Take 1 tablet (500 mg total) by mouth 2 (two) times daily. 14 tablet 0  . omeprazole (PRILOSEC) 20 MG capsule Take 1 capsule (20 mg total) by mouth daily. 60 capsule 5  . Rimegepant Sulfate (NURTEC) 75 MG TBDP Take 75 mg by mouth daily as needed. Dispense 10 or max allowed by insurance. Triptans contraindicated due to retinal migraines 10 tablet 6   No current facility-administered medications for this visit.   Allergies:  Fish allergy, Bactrim ds [sulfamethoxazole-trimethoprim], Ciprocinonide [fluocinolone], Codeine, Penicillins, and Sulfa antibiotics   Social History: The patient  reports that she has never smoked. She has never used smokeless tobacco. She reports that she does not drink alcohol and does not use drugs.   Family History: The patient's family history includes Cancer in her maternal grandmother and mother; Dementia in her father; Diabetes in her maternal grandfather; Healthy in her daughter, daughter, and daughter; Heart attack in her paternal grandmother; Heart disease in her maternal grandmother; Migraines in her mother.   ROS:  Please see the history of present illness. Otherwise, complete review of systems is positive for none.  All other systems are reviewed and negative.   Physical Exam: VS:  There were no vitals taken for this visit., BMI There is no height or weight on file to calculate BMI.  Wt  Readings from Last 3 Encounters:  10/23/20 130 lb 3.2 oz (59.1 kg)  09/25/20 125 lb (56.7 kg)  08/07/20 126 lb (57.2 kg)    General: Patient appears comfortable at rest. Neck: Supple, no elevated JVP or carotid bruits, no thyromegaly. Lungs: Clear to auscultation, nonlabored breathing at rest. Cardiac: Regular rate and rhythm, no S3 or significant systolic murmur, no pericardial rub. Extremities: No pitting edema, distal pulses 2+. Skin: Warm and dry. Musculoskeletal: No kyphosis. Neuropsychiatric: Alert and oriented x3, affect grossly appropriate.  ECG:  An ECG dated 10/23/2020 was personally reviewed today and demonstrated:  Normal sinus rhythm rate of 68  Recent Labwork: 05/15/2020: ALT 13; AST 19; BUN 14; Hemoglobin 14.9; Magnesium 2.2; Platelets 267; Potassium 4.0; Sodium 143 06/21/2020: Creatinine, Ser 0.70     Component Value Date/Time   CHOL 200 (H) 11/11/2017 0937   TRIG 45 11/11/2017 0937   HDL 69 11/11/2017 0937   CHOLHDL 2.9 11/11/2017 1610  CHOLHDL 2.9 11/01/2013 1428   VLDL 13 11/01/2013 1428   LDLCALC 122 (H) 11/11/2017 2694    Other Studies Reviewed Today:   Assessment and Plan:  1. PSVT (paroxysmal supraventricular tachycardia) ([place])   2. Essential hypertension   3. Palpitations    1. PSVT (paroxysmal supraventricular tachycardia) ([facility]) History of PSVT.  Last seen by Dr. Harl Knox March 06, 2020.  Previous notes mention PSVT documented on her previous event recorder but there was no report on his symptoms.  Patient was having rare palpitations once or twice every 6 months which had been resistant to medications.  2. Essential hypertension History of hypertension.  Currently taking HCTZ 25 mg daily.  Blood pressure is elevated today.  Patient states her systolic blood pressures are usually in the 120-130 range.  She states her blood pressure machine needs new batteries.  She is planning on getting this fixed.  Asked her to start checking her blood pressure daily  and report blood pressures at or above 130/80 sustained.  3. Palpitations Recently treated for bacterial vaginal infection with Flagyl.  She states the palpitations seem to originate during that time.  She denies any stimulant intake.  She drinks some occasional decaffeinated coffee and noncaffeinated beverages otherwise.  Denies any substance use.  No smoking.  No drinking.  Please get a 14-day ZIO monitor to assess palpitations.  She was given Toprol-XL 25 mg to take for palpitations but she was reticent to start the medication.  She was advised not to in a phone call with staff here due to not being able to assess her vital signs.  We will hold off on Toprol for now pending monitor results  Medication Adjustments/Labs and Tests Ordered: Current medicines are reviewed at length with the patient today.  Concerns regarding medicines are outlined above.   Disposition: Follow-up with Dr. Harl Knox or APP 6 weeks  Signed, Levell July, NP 11/27/2020 4:55 PM    [facility] at [place], [place], [place] [address] Phone: [phone]; Fax: [phone]

## 2020-11-27 NOTE — Progress Notes (Deleted)
PATIENT: April Knox DOB: 25-Jan-1956  REASON FOR VISIT: follow up HISTORY FROM: patient  Virtual Visit via Telephone Note  I connected with April Knox on 11/27/20 at 10:30 AM EDT by telephone and verified that I am speaking with the correct person using two identifiers.   I discussed the limitations, risks, security and privacy concerns of performing an evaluation and management service by telephone and the availability of in person appointments. I also discussed with the patient that there may be a patient responsible charge related to this service. The patient expressed understanding and agreed to proceed.   History of Present Illness:  11/27/20 [name] is a 65 y.o. female here today for follow up for occular migraines. She was seen 05/15/2020 in consult with [name] and started on magnesium citrate 400-600mg  QD, riboflavin 400mg  QD and CoQ 10 100mg  TID. Nurtec for abortive therapy. Labs and MRI/MRA unremarkable.    History (copied from [name] previous notes)  HPI:  April Knox is a 65 y.o. female here as requested by [name], MD for migraine. PMHx PSVT, anxiety, I reviewed Dr.Hasanaj's notes: Patient has a history of migraines, with aura, also has dull headaches and at appointment in May of this year she had a dull headache for at least a week, she also describes of vision changes unclear possibly an aura, Imitrex and Tylenol help, headaches at that time had been worsening for several weeks, gradual in onset, dull, throbbing, bandlike, bilateral involving the frontal and temporal area 7 out of 10 in severity aggravated by cold, hunger, tea, coffee, sleep deprivation, perfume/[place] and relieved by nothing, physical exam was normal including head, ears, eyes, nose, throat, lungs, heart, abdomen.  She refused Topamax in the past.  Migraines started 8-10 years ago, she was going to the ICU and when she walked through the door she started seeing  kaleidoscope in both eyes didn't last long, diagnosed with ocular migraines. Several years ago she had similar episode, lasts 4-5 minutes, she can still see and function, not followed by significant headaches, this last time it was 3 months, she had a dull headache, she is under a lot of stress due to covid and lost house and job, usually tylenol helps, she opened a blind and she started seeing the kaleidoscope. Lasted 5 minutes and resolved on its own. But her vision in her left eye was seeing floaters. She has has migraine with head pain years ago but usually its just vision changes that resolve and happened 3x in the last 10 years. Eye exam was essentially normal included dilated exam. But she feels her left eye vision is still impaired, she is seeing floaters and she can't focus in her left eye, sensitivity to light. She saw [name] but she says he didn't so anything, she had a cat scan, she did not tolerate Topamax and she just wants to make sure everything is ok, he told her to stop Topamax. She declined any medication. She has stopped the Topamax and see no difference, not better or worse, but she had been taking the Topamax for 3 days only, she took Nurtec and she felt better. She does not get headaches often at all. She has vision loss left eye. She takes Magnesium daily. Currently doesn't feel can see as well out the left eye, having floaters and focusing problems, sensitivity to light and subjective vision loss no headache, no jaw pain, no tenderness sin the temples, neck pain.Nurtec helped.  Not eating too well, just snacking. No other focal neurologic deficits, associated symptoms, inciting events or modifiable factors.  Reviewed notes, labs and imaging from outside physicians, which showed:  01/2020; TSH normal, HgbA1c 5.9  Requested CT scan from [name] to review. Reviewed imaging on CD 02-19-2020 and report on PACS:   CT head showed No acute intracranial abnormalities including mass  lesion or mass effect, hydrocephalus, extra-axial fluid collection, midline shift, hemorrhage, or acute infarction, large ischemic events (personally reviewed images). Assymmetry of the ventricles likely not clinically significant.  Personally reviewed family eye care notes and evaluation, patient was seen for left eye vision loss, floaters, complaining about decreased vision in left eye, redness, pain in the eye, examination included tonometry external exam, slit-lamp exam, diagnosed with recurrence of ocular migraine symptoms, dilated exam and visual field testing unremarkable, recommended further evaluation with neurologist and MRI.  Observations/Objective:  Generalized: Well developed, in no acute distress  Mentation: Alert oriented to time, place, history taking. Follows all commands speech and language fluent   Assessment and Plan:  65 y.o. year old female  has a past medical history of Abnormal Pap smear, Abnormal Papanicolaou smear of cervix with positive human papilloma virus (HPV) test (04/06/2018), Anxiety, Back pain (03/07/2014), Cervical spine disease, Chest pain, Elevated cholesterol, Ganglion cyst of left foot (12/06/2013), H/O bilateral breast implants (03/03/2013), Hematuria (03/28/2014), Hot flashes (12/06/2013), Hypertension, Menopausal symptoms (12/06/2013), Mental disorder, Migraine with visual aura, Osteopenia after menopause (10/06/2018), PSVT (paroxysmal supraventricular tachycardia) ([place]), Rectocele (03/03/2013), RUQ pain (03/07/2014), Vaginal discharge (02/24/2014), Vaginal dryness, menopausal (12/06/2013), Vaginal Pap smear, abnormal, and Yeast infection (02/24/2014). here with  No diagnosis found.  No orders of the defined types were placed in this encounter.   No orders of the defined types were placed in this encounter.    Follow Up Instructions:  I discussed the assessment and treatment plan with the patient. The patient was provided an opportunity to ask questions and all were  answered. The patient agreed with the plan and demonstrated an understanding of the instructions.   The patient was advised to call back or seek an in-person evaluation if the symptoms worsen or if the condition fails to improve as anticipated.  I provided *** minutes of non-face-to-face time during this encounter. Patient located at their place of residence during [place] visit. Provider is in the office.    Debbora Presto, NP

## 2020-11-28 ENCOUNTER — Telehealth: Payer: Self-pay | Admitting: *Deleted

## 2020-11-28 ENCOUNTER — Telehealth: Payer: Self-pay | Admitting: Family Medicine

## 2020-11-28 NOTE — Telephone Encounter (Signed)
Pt says she never received a second monitor from [facility] (website says it was sent 10/30/20) will contact company as pt wants another one mailed to her and declined she had time to come in office for placement - will cancel appt for tomorrow with Katina Dung, NP and re send monitor

## 2020-11-28 NOTE — Telephone Encounter (Signed)
Patient would like monitor to be mailed to her. She returned Lydia's call.

## 2020-11-29 ENCOUNTER — Other Ambulatory Visit: Payer: Self-pay | Admitting: Adult Health

## 2020-11-29 ENCOUNTER — Ambulatory Visit: Payer: Medicare Other | Admitting: Family Medicine

## 2020-11-29 DIAGNOSIS — R002 Palpitations: Secondary | ICD-10-CM

## 2020-11-29 DIAGNOSIS — I10 Essential (primary) hypertension: Secondary | ICD-10-CM

## 2020-11-29 DIAGNOSIS — I471 Supraventricular tachycardia: Secondary | ICD-10-CM

## 2020-11-29 NOTE — Patient Instructions (Incomplete)
Below is our plan: ° °We will *** ° °Please make sure you are staying well hydrated. I recommend 50-60 ounces daily. Well balanced diet and regular exercise encouraged. Consistent sleep schedule with 6-8 hours recommended.  ° °Please continue follow up with care team as directed.  ° °Follow up with *** in *** ° °[name] receive a survey regarding today's visit. I encourage you to leave honest feed back as I do use this information to improve patient care. Thank you for seeing me today!  ° ° °

## 2020-11-29 NOTE — Progress Notes (Deleted)
PATIENT: April Knox DOB: 1956/05/15  REASON FOR VISIT: follow up HISTORY FROM: patient  Virtual Visit via Telephone Note  I connected with April Knox on 11/29/20 at  7:45 AM EDT by telephone and verified that I am speaking with the correct person using two identifiers.   I discussed the limitations, risks, security and privacy concerns of performing an evaluation and management service by telephone and the availability of in person appointments. I also discussed with the patient that there may be a patient responsible charge related to this service. The patient expressed understanding and agreed to proceed.   History of Present Illness:  11/29/20 April Knox is a 65 y.o. female here today for follow up for occular migraines. She was seen 05/15/2020 in consult with [name] and started on magnesium citrate 400-600mg  QD, riboflavin 400mg  QD and CoQ 10 100mg  TID. Nurtec for abortive therapy. Labs and MRI/MRA unremarkable.    History (copied from [name] previous notes)  HPI:  April Knox is a 65 y.o. female here as requested by [name], MD for migraine. PMHx PSVT, anxiety, I reviewed Dr.Hasanaj's notes: Patient has a history of migraines, with aura, also has dull headaches and at appointment in May of this year she had a dull headache for at least a week, she also describes of vision changes unclear possibly an aura, Imitrex and Tylenol help, headaches at that time had been worsening for several weeks, gradual in onset, dull, throbbing, bandlike, bilateral involving the frontal and temporal area 7 out of 10 in severity aggravated by cold, hunger, tea, coffee, sleep deprivation, perfume/[place] and relieved by nothing, physical exam was normal including head, ears, eyes, nose, throat, lungs, heart, abdomen.  She refused Topamax in the past.  Migraines started 8-10 years ago, she was going to the ICU and when she walked through the door she started seeing  kaleidoscope in both eyes didn't last long, diagnosed with ocular migraines. Several years ago she had similar episode, lasts 4-5 minutes, she can still see and function, not followed by significant headaches, this last time it was 3 months, she had a dull headache, she is under a lot of stress due to covid and lost house and job, usually tylenol helps, she opened a blind and she started seeing the kaleidoscope. Lasted 5 minutes and resolved on its own. But her vision in her left eye was seeing floaters. She has has migraine with head pain years ago but usually its just vision changes that resolve and happened 3x in the last 10 years. Eye exam was essentially normal included dilated exam. But she feels her left eye vision is still impaired, she is seeing floaters and she can't focus in her left eye, sensitivity to light. She saw [name] but she says he didn't so anything, she had a cat scan, she did not tolerate Topamax and she just wants to make sure everything is ok, he told her to stop Topamax. She declined any medication. She has stopped the Topamax and see no difference, not better or worse, but she had been taking the Topamax for 3 days only, she took Nurtec and she felt better. She does not get headaches often at all. She has vision loss left eye. She takes Magnesium daily. Currently doesn't feel can see as well out the left eye, having floaters and focusing problems, sensitivity to light and subjective vision loss no headache, no jaw pain, no tenderness sin the temples, neck pain.Nurtec  helped. Not eating too well, just snacking. No other focal neurologic deficits, associated symptoms, inciting events or modifiable factors.  Reviewed notes, labs and imaging from outside physicians, which showed:  01/2020; TSH normal, HgbA1c 5.9  Requested CT scan from [name] to review. Reviewed imaging on CD 02-19-2020 and report on PACS:   CT head showed No acute intracranial abnormalities including mass  lesion or mass effect, hydrocephalus, extra-axial fluid collection, midline shift, hemorrhage, or acute infarction, large ischemic events (personally reviewed images). Assymmetry of the ventricles likely not clinically significant.  Personally reviewed family eye care notes and evaluation, patient was seen for left eye vision loss, floaters, complaining about decreased vision in left eye, redness, pain in the eye, examination included tonometry external exam, slit-lamp exam, diagnosed with recurrence of ocular migraine symptoms, dilated exam and visual field testing unremarkable, recommended further evaluation with neurologist and MRI.  Observations/Objective:  Generalized: Well developed, in no acute distress  Mentation: Alert oriented to time, place, history taking. Follows all commands speech and language fluent   Assessment and Plan:  65 y.o. year old female  has a past medical history of Abnormal Pap smear, Abnormal Papanicolaou smear of cervix with positive human papilloma virus (HPV) test (04/06/2018), Anxiety, Back pain (03/07/2014), Cervical spine disease, Chest pain, Elevated cholesterol, Ganglion cyst of left foot (12/06/2013), H/O bilateral breast implants (03/03/2013), Hematuria (03/28/2014), Hot flashes (12/06/2013), Hypertension, Menopausal symptoms (12/06/2013), Mental disorder, Migraine with visual aura, Osteopenia after menopause (10/06/2018), PSVT (paroxysmal supraventricular tachycardia) ([place]), Rectocele (03/03/2013), RUQ pain (03/07/2014), Vaginal discharge (02/24/2014), Vaginal dryness, menopausal (12/06/2013), Vaginal Pap smear, abnormal, and Yeast infection (02/24/2014). here with  No diagnosis found.  No orders of the defined types were placed in this encounter.   No orders of the defined types were placed in this encounter.    Follow Up Instructions:  I discussed the assessment and treatment plan with the patient. The patient was provided an opportunity to ask questions and all were  answered. The patient agreed with the plan and demonstrated an understanding of the instructions.   The patient was advised to call back or seek an in-person evaluation if the symptoms worsen or if the condition fails to improve as anticipated.  I provided *** minutes of non-face-to-face time during this encounter. Patient located at their place of residence during [place] visit. Provider is in the office.    Debbora Presto, NP

## 2020-11-30 ENCOUNTER — Other Ambulatory Visit: Payer: Self-pay

## 2020-12-03 ENCOUNTER — Telehealth: Payer: Self-pay | Admitting: Adult Health

## 2020-12-03 NOTE — Telephone Encounter (Signed)
Patient wants a return phone as soon as possible from NP [place] or from a nurse. Clinical staff will follow up with patient.

## 2020-12-03 NOTE — Telephone Encounter (Signed)
Pt wants to know what [name] thinks about [employer] skin lightening cream for the rectal area. Is it safe to lighten that area. Informed patient that it was not recommended by [name] as it could cause skin irritations. Patient had no other questions at this time.

## 2020-12-04 ENCOUNTER — Telehealth: Payer: Self-pay | Admitting: Family Medicine

## 2020-12-04 DIAGNOSIS — G43109 Migraine with aura, not intractable, without status migrainosus: Secondary | ICD-10-CM

## 2020-12-28 ENCOUNTER — Other Ambulatory Visit: Payer: Self-pay | Admitting: Adult Health

## 2021-01-11 ENCOUNTER — Telehealth: Payer: Self-pay | Admitting: Adult Health

## 2021-01-11 NOTE — Telephone Encounter (Signed)
Zailynn had vaginal dryness with sex, did not use lubricate, try luvena for more mositure

## 2021-01-11 NOTE — Telephone Encounter (Signed)
Pt would like a return call, she's having more vaginal dryness than usual  Please call [phone] or [phone]

## 2021-01-24 ENCOUNTER — Telehealth: Payer: Self-pay | Admitting: Adult Health

## 2021-01-24 ENCOUNTER — Encounter: Payer: Self-pay | Admitting: *Deleted

## 2021-01-24 ENCOUNTER — Telehealth: Payer: Self-pay | Admitting: Cardiology

## 2021-01-24 NOTE — Telephone Encounter (Signed)
Metoprolol sounds appropriate to me as long as blood pressure and heart rate tolerate.  Did her PCP place her on statin medications?  If not we may need to start.  Do you have the lab results?  Need to find out results first if she is not on statin medications to determine if we need to start.  Thanks

## 2021-01-24 NOTE — Telephone Encounter (Signed)
Patient informed and verbalized understanding of plan. Is not able to give dose and directions of metoprolol but will call office back with that information Reports having lipid lab work by PCP recently and was not started on cholesterol meds Lipid lab work requested

## 2021-01-24 NOTE — Telephone Encounter (Signed)
Pt had questions about labs and was using OTC supplement for cholesterol but had palpitation with it, to stop that and cardiologist gave her Toprol, take it

## 2021-01-24 NOTE — Telephone Encounter (Signed)
per phone call from pt she was unable to wear monitor and she's having irregular beats. PCP wants her to start a medication for it and she wants to ask if it's ok if she starts it.   Also states her cholesterol is high

## 2021-01-24 NOTE — Telephone Encounter (Signed)
Will fwd to provider for advice.  She has OV scheduled for 02/12/2021 with you.

## 2021-01-24 NOTE — Telephone Encounter (Signed)
Pt called, saw her PCP & got lab results PCP wants to start her on Metoprolol due to irregular heartbeat  Her total cholesterol was 225 & PCP recommended diet changes, she states she barely eats anything now Pt wants Jennifer's input  Please call the store [phone] or her cell after 3:30 [phone]

## 2021-01-24 NOTE — Telephone Encounter (Signed)
Metoprolol 25mg  is what Dr. Sherrie Sport wants to start her on

## 2021-01-25 ENCOUNTER — Telehealth: Payer: Self-pay | Admitting: Cardiology

## 2021-01-25 NOTE — Telephone Encounter (Signed)
Please give pt a call- she wants to come in for an EKG. She's been having irregular heart rate for the past few days   [phone]

## 2021-01-25 NOTE — Telephone Encounter (Signed)
NA NM, will try again

## 2021-01-25 NOTE — Telephone Encounter (Signed)
Pt is returning call.  

## 2021-01-25 NOTE — Telephone Encounter (Signed)
Spoke to patient who stated that she feels like she has an irregular heartbeat and she can feel it. She has felt this way for 3-4 days. After reviewing pt's chart, pt was prescribed Metoprolol 25 mg tablets. Pt stated that she has only taken 0.5 tablet due to fear of it doing more harm than good. Pt also c/o pre-cordial catch pain. Pt would like to come in for a nurse visit to have an EKG to make sure everything is okay. Please advise.

## 2021-01-25 NOTE — Telephone Encounter (Signed)
Please advised the patient to take the medication as directed.  If she has problems after taking the medication please call us.

## 2021-01-26 ENCOUNTER — Other Ambulatory Visit: Payer: Self-pay | Admitting: Adult Health

## 2021-01-29 ENCOUNTER — Other Ambulatory Visit: Payer: Self-pay

## 2021-01-29 NOTE — Telephone Encounter (Signed)
Spoke to pt who stated she was only still taking 0.5 tablets every now and then. Advised pt that she needed to take medication as prescribed. Pt verbalized understanding. Pt is willing to give medication a couple of days to work and will call us if she has any problems.

## 2021-01-29 NOTE — Telephone Encounter (Signed)
Pt is returning call from last week- she's requesting an apt

## 2021-02-01 ENCOUNTER — Telehealth: Payer: Self-pay | Admitting: *Deleted

## 2021-02-01 NOTE — Telephone Encounter (Signed)
-----   Message from April Knox., NP sent at 01/31/2021 12:50 PM EDT ----- Her labs look okay except for her LDL is 152.  She is not on any, lipid medication.  Ask her if she be willing to start low-dose atorvastatin 10 mg and get her cholesterol rechecked in 6 to 8 weeks.  If so go ahead and send then atorvastatin 10 mg daily and get a fasting lipid profile and LFTs in 6 to 8 weeks.

## 2021-02-01 NOTE — Telephone Encounter (Signed)
Laurine Blazer, LPN  02/05/2093 7:09 PM EDT Back to Top     Notified, copy to pcp. She prefers to wait till her OV with Jonni Sanger on 02/12/2021 at 8:30 am in [place] office.    She will discuss with him further at that time.

## 2021-02-04 ENCOUNTER — Other Ambulatory Visit: Payer: Self-pay

## 2021-02-04 ENCOUNTER — Telehealth: Payer: Self-pay | Admitting: Adult Health

## 2021-02-04 ENCOUNTER — Ambulatory Visit (INDEPENDENT_AMBULATORY_CARE_PROVIDER_SITE_OTHER): Payer: Medicare Other | Admitting: Physician Assistant

## 2021-02-04 ENCOUNTER — Encounter: Payer: Self-pay | Admitting: Physician Assistant

## 2021-02-04 VITALS — BP 106/66 | HR 69 | Ht 67.0 in | Wt 128.8 lb

## 2021-02-04 DIAGNOSIS — I10 Essential (primary) hypertension: Secondary | ICD-10-CM

## 2021-02-04 DIAGNOSIS — R002 Palpitations: Secondary | ICD-10-CM

## 2021-02-04 DIAGNOSIS — E7800 Pure hypercholesterolemia, unspecified: Secondary | ICD-10-CM | POA: Diagnosis not present

## 2021-02-04 MED ORDER — ROSUVASTATIN CALCIUM 10 MG PO TABS: 10.0000 mg | ORAL_TABLET | Freq: Every day | ORAL | 3 refills | Status: DC

## 2021-02-04 NOTE — Patient Instructions (Signed)
Medication Instructions:  START Crestor 10 mg daily at dinner  *If you need a refill on your cardiac medications before your next appointment, please call your pharmacy*   Lab Work: Lipids and Liver function test in 12 weeks- early September    If you have labs (blood work) drawn today and your tests are completely normal, you will receive your results only by: Marland Kitchen MyChart Message (if you have MyChart) OR . A paper copy in the mail If you have any lab test that is abnormal or we need to change your treatment, we will call you to review the results.   Testing/Procedures: None today    Follow-Up: At [facility], you and your health needs are our priority.  As part of our continuing mission to provide you with exceptional heart care, we have created designated Provider Care Teams.  These Care Teams include your primary Cardiologist (physician) and Advanced Practice Providers (APPs -  Physician Assistants and Nurse Practitioners) who all work together to provide you with the care you need, when you need it.  We recommend signing up for the patient portal called "MyChart".  Sign up information is provided on this After Visit Summary.  MyChart is used to connect with patients for Virtual Visits (Telemedicine).  Patients are able to view lab/test results, encounter notes, upcoming appointments, etc.  Non-urgent messages can be sent to your provider as well.   To learn more about what you can do with MyChart, go to [email or web address].    Your next appointment:   6 month(s)  The format for your next appointment:   In Person  Provider:   Carlyle Dolly, MD or Katina Dung, NP   Other Instructions None

## 2021-02-04 NOTE — Progress Notes (Signed)
Cardiology Office Note:    Date:  02/04/2021   ID:  [name], DOB 1955-09-20, MRN [id number]  PCP:  [name], MD   [place] Providers Cardiologist:  Carlyle Dolly, MD      Referring MD: [name], MD   Chief Complaint:  Follow-up (Palpitations; HLD)    Patient Profile:    April Knox is a 65 y.o. female with:   Palpitations  Paroxysmal supraventricular tachycardia  Hypertension  Hyperlipidemia  Prior CV studies: Zio Monitor 10/2020 Patch Wear Time:  1 days and 1 hours (2022-04-20T15:06:18-0400 to 2022-04-21T16:51:52-0400)  Patient had a min HR of 50 bpm, max HR of 126 bpm, and avg HR of 65 bpm. Predominant underlying rhythm was Sinus Rhythm. Isolated SVEs were rare (<1.0%), and no SVE Couplets or SVE Triplets were present. Isolated VEs were rare (<1.0%), and no VE Couplets  or VE Triplets were present.   Stress echocardiogram 08/20/2009 EF 60  staged echo: There was no echocardiographic evidence for stress-induced ischemia.   History of Present Illness: April Knox was last seen in 2/22 by Katina Dung, NP for symptoms of palpitations.  Metoprolol succinate had been given to her by her PCP but she had not started it yet.  A Zio monitor was ordered.  She had trouble wearing the monitor and it was only worn for 1 day.  Avg HR 65, < 1% PACs, < 1% PVCs.  She returns for f/u.  She is here alone.  She has not had chest pain, shortness of breath, syncope. She remains very active.  She has had a lot of stress in her life recently.  She had recurrent palpitations and her PCP gave her Metoprolol succinate 25 mg once daily.  She was hesitant but eventually started on 1/2 tab daily.  She has not had further palpitations since.          Past Medical History:  Diagnosis Date  . Abnormal Pap smear   . Abnormal Papanicolaou smear of cervix with positive human papilloma virus (HPV) test 04/06/2018   Pap was LSIL with +HPV, will need colpo____  . Anxiety    . Back pain 03/07/2014  . Cervical spine disease    Mild  . Chest pain   . Elevated cholesterol   . Ganglion cyst of left foot 12/06/2013  . H/O bilateral breast implants 03/03/2013  . Hematuria 03/28/2014  . Hot flashes 12/06/2013  . Hypertension   . Menopausal symptoms 12/06/2013  . Mental disorder    anxiety  . Migraine with visual aura   . Osteopenia after menopause 10/06/2018   DEXA 09/28/2018, osteopenia, take calcium vitamin D and stay active   . PSVT (paroxysmal supraventricular tachycardia) ([place])    Documented by event recorder  . Rectocele 03/03/2013  . RUQ pain 03/07/2014  . Vaginal discharge 02/24/2014  . Vaginal dryness, menopausal 12/06/2013  . Vaginal Pap smear, abnormal   . Yeast infection 02/24/2014    Current Medications: Current Meds  Medication Sig  . acetaminophen (TYLENOL) 500 MG tablet Take 500 mg by mouth every 6 (six) hours as needed (for pain).   Marland Kitchen ALPRAZolam (XANAX) 1 MG tablet TAKE ONE TABLET BY MOUTH TWICE DAILY AS NEEDED FOR ANXIETY  . hydrochlorothiazide (HYDRODIURIL) 25 MG tablet Take 25 mg by mouth daily.  Marland Kitchen levocetirizine (XYZAL) 5 MG tablet Take 5 mg by mouth daily. In the morning per the patient.  Marland Kitchen omeprazole (PRILOSEC) 20 MG capsule Take 1 capsule (20 mg total)  by mouth daily.  . Rimegepant Sulfate (NURTEC) 75 MG TBDP Take 75 mg by mouth daily as needed. Dispense 10 or max allowed by insurance. Triptans contraindicated due to retinal migraines  . rosuvastatin (CRESTOR) 10 MG tablet Take 1 tablet (10 mg total) by mouth daily.  . [DISCONTINUED] Coenzyme Q10 (CO Q 10 PO) Take by mouth. Takes sometimes     Allergies:   Fish allergy, Bactrim ds [sulfamethoxazole-trimethoprim], Ciprocinonide [fluocinolone], Codeine, Penicillins, and Sulfa antibiotics   Social History   Tobacco Use  . Smoking status: Never Smoker  . Smokeless tobacco: Never Used  Vaping Use  . Vaping Use: Never used  Substance Use Topics  . Alcohol use: No    Alcohol/week: 0.0 standard  drinks  . Drug use: No     Family Hx: The patient's family history includes Cancer in her maternal grandmother and mother; Dementia in her father; Diabetes in her maternal grandfather; Healthy in her daughter, daughter, and daughter; Heart attack in her paternal grandmother; Heart disease in her maternal grandmother; Migraines in her mother.  ROS   EKGs/Labs/Other Test Reviewed:    EKG:  EKG is not ordered today.  The ekg ordered today demonstrates n/a  Recent Labs: 05/15/2020: ALT 13; BUN 14; Hemoglobin 14.9; Magnesium 2.2; Platelets 267; Potassium 4.0; Sodium 143 06/21/2020: Creatinine, Ser 0.70   Recent Lipid Panel Lab Results  Component Value Date/Time   CHOL 200 (H) 11/11/2017 09:37 AM   TRIG 45 11/11/2017 09:37 AM   HDL 69 11/11/2017 09:37 AM   LDLCALC 122 (H) 11/11/2017 09:37 AM   Labs obtained through [facility] - personally reviewed and interpreted: 09/18/2020: Total cholesterol 224, HDL 63, LDL 150, triglycerides 64, A1c 6.1, creatinine 0.69, ALT 15 05/15/2020: Hgb 14.9, K+ 4 02/21/2020: TSH 0.558   Risk Assessment/Calculations:       10 year ASCVD risk: 8.9%  Physical Exam:    VS:  BP 106/66   Pulse 69   Ht 5\' 7"  (1.702 m)   Wt 128 lb 12.8 oz (58.4 kg)   SpO2 94%   BMI 20.17 kg/m     Wt Readings from Last 3 Encounters:  02/04/21 128 lb 12.8 oz (58.4 kg)  10/23/20 130 lb 3.2 oz (59.1 kg)  09/25/20 125 lb (56.7 kg)     Constitutional:      Appearance: Healthy appearance. Not in distress.  Neck:     Vascular: JVD normal.  Pulmonary:     Effort: Pulmonary effort is normal.     Breath sounds: No wheezing. No rales.  Cardiovascular:     Normal rate. Regular rhythm. Normal S1. Normal S2.     Murmurs: There is no murmur.  Pulses:    Intact distal pulses.  Edema:    Peripheral edema absent.  Abdominal:     Palpations: Abdomen is soft.  Skin:    General: Skin is warm and dry.  Neurological:     Mental Status: Alert and oriented to person, place and  time.     Cranial Nerves: Cranial nerves are intact.          ASSESSMENT & PLAN:    1. Palpitations She had difficulty wearing her monitor and only completed 1 day.  She does remember having symptoms while she wore the monitor.  She had rare PVCs and PACs.  I suspect her palpitations are related to PACs and PVCs.  I have encouraged her to continue using beta-blocker therapy.  If she has worsening symptoms over time, we  can certainly consider increasing the dose to 25 mg daily.  Follow-up in 6 months.  2. Essential hypertension The patient's blood pressure is controlled on her current regimen.  Continue current therapy.   3. Pure hypercholesterolemia 10-year ASCVD risk is 8.9%.  We discussed the benefits of risk reduction with statin therapy.  She is willing to try this.  Start rosuvastatin 10 mg daily.  Obtain follow-up lipids and LFTs in 12 weeks.    Dispo:  Return in about 6 months (around 08/06/2021) for Routine Follow Up w/ Dr. Harl Bowie.   Medication Adjustments/Labs and Tests Ordered: Current medicines are reviewed at length with the patient today.  Concerns regarding medicines are outlined above.  Tests Ordered: Orders Placed This Encounter  Procedures  . Lipid panel  . Hepatic function panel   Medication Changes: Meds ordered this encounter  Medications  . rosuvastatin (CRESTOR) 10 MG tablet    Sig: Take 1 tablet (10 mg total) by mouth daily.    Dispense:  90 tablet    Refill:  3    Signed, Richardson Dopp, PA-C  02/04/2021 4:43 PM    [facility] [address], [place], [place]  [address] Phone: [phone]; Fax: [phone]

## 2021-02-08 ENCOUNTER — Ambulatory Visit: Payer: Medicare Other | Admitting: Family Medicine

## 2021-02-08 ENCOUNTER — Telehealth: Payer: Self-pay | Admitting: Cardiology

## 2021-02-08 MED ORDER — METOPROLOL SUCCINATE ER 25 MG PO TB24: 25.0000 mg | ORAL_TABLET | Freq: Every day | ORAL | 3 refills | Status: DC

## 2021-02-08 NOTE — Telephone Encounter (Signed)
Patient c/o Palpitations:  High priority if patient c/o lightheadedness, shortness of breath, or chest pain  How long have you had palpitations/irregular HR/ Afib? Are you having the symptoms now? Off and on   Are you currently experiencing lightheadedness, SOB or CP? NO just irregular heart beats  Do you have a history of afib (atrial fibrillation) or irregular heart rhythm? YES  Have you checked your BP or HR? (document readings if available): no  Are you experiencing any other symptoms? States that she is having episodes of anxiety also.

## 2021-02-08 NOTE — Telephone Encounter (Signed)
Spoke to pt who stated she was having irregular heart beats. Pt stated that her hr was 87 bpm ( I informed her that 60-100 bpm is normal). Pt states she is not having any other symptoms and it has resolved itself this morning but she is concerned that it may start up again.

## 2021-02-08 NOTE — Telephone Encounter (Signed)
PCP had Rx'd Metoprolol succinate 12.5 mg once daily. It is not on her list. She can increase this to 25 mg once daily. Richardson Dopp, PA-C    02/08/2021 11:11 AM

## 2021-02-08 NOTE — Telephone Encounter (Signed)
Pt notified and voiced understanding 

## 2021-02-12 ENCOUNTER — Ambulatory Visit: Payer: Medicare Other | Admitting: Family Medicine

## 2021-02-22 ENCOUNTER — Telehealth: Payer: Self-pay | Admitting: Adult Health

## 2021-02-22 ENCOUNTER — Other Ambulatory Visit: Payer: Self-pay | Admitting: Adult Health

## 2021-02-22 NOTE — Telephone Encounter (Signed)
Pt requesting refill on Flagyl States she's noticed an odor for a couple days & last time [place] treated the odor with this & it worked  Please advise & call pt     [employer]

## 2021-02-23 ENCOUNTER — Other Ambulatory Visit: Payer: Self-pay | Admitting: Obstetrics & Gynecology

## 2021-02-23 MED ORDER — METRONIDAZOLE 500 MG PO TABS: 500.0000 mg | ORAL_TABLET | Freq: Two times a day (BID) | ORAL | 0 refills | Status: AC

## 2021-02-23 NOTE — Progress Notes (Signed)
Rx sent in for Flagyl due to suspected vaginitis

## 2021-02-26 ENCOUNTER — Other Ambulatory Visit: Payer: Self-pay | Admitting: Adult Health

## 2021-03-18 ENCOUNTER — Ambulatory Visit: Payer: Medicare Other | Admitting: Family Medicine

## 2021-03-18 ENCOUNTER — Other Ambulatory Visit: Payer: Medicare Other

## 2021-03-21 ENCOUNTER — Other Ambulatory Visit (HOSPITAL_COMMUNITY)
Admission: RE | Admit: 2021-03-21 | Discharge: 2021-03-21 | Disposition: A | Discharge status: Home / Self Care | Payer: Medicare Other | Source: Ambulatory Visit | Attending: Obstetrics & Gynecology | Admitting: Obstetrics & Gynecology

## 2021-03-21 ENCOUNTER — Other Ambulatory Visit (INDEPENDENT_AMBULATORY_CARE_PROVIDER_SITE_OTHER): Payer: Medicare Other

## 2021-03-21 ENCOUNTER — Other Ambulatory Visit: Payer: Self-pay

## 2021-03-21 DIAGNOSIS — N898 Other specified noninflammatory disorders of vagina: Secondary | ICD-10-CM

## 2021-03-21 NOTE — Progress Notes (Addendum)
   NURSE VISIT- VAGINITIS/STD/POC  SUBJECTIVE:  April Knox is a 65 y.o. G3P3 GYN patientfemale here for a vaginal swab for vaginitis screening, STD screen.  She reports the following symptoms: discharge described as malodorous and local irritation for 1 week. Denies abnormal vaginal bleeding, significant pelvic pain, fever, or UTI symptoms.  OBJECTIVE:  There were no vitals taken for this visit.  Appears well, in no apparent distress  ASSESSMENT: Vaginal swab for  vaginitis & STD screening  PLAN: Self-collected vaginal probe for Gonorrhea, Chlamydia, Trichomonas, Bacterial Vaginosis, Yeast sent to lab Treatment: to be determined once results are received Follow-up as needed if symptoms persist/worsen, or new symptoms develop  Thy Gullikson A Veasna Santibanez  03/21/2021 4:41 PM   Chart reviewed for nurse visit. Agree with plan of care.  Roma Schanz, [place] 03/22/2021 8:26 AM

## 2021-03-22 ENCOUNTER — Other Ambulatory Visit: Payer: Medicare Other

## 2021-03-22 ENCOUNTER — Other Ambulatory Visit: Payer: Self-pay | Admitting: Adult Health

## 2021-03-25 LAB — CERVICOVAGINAL ANCILLARY ONLY
Bacterial Vaginitis (gardnerella): NEGATIVE
Candida Glabrata: NEGATIVE
Candida Vaginitis: NEGATIVE
Chlamydia: NEGATIVE
Comment: NEGATIVE
Comment: NEGATIVE
Comment: NEGATIVE
Comment: NEGATIVE
Comment: NEGATIVE
Comment: NORMAL
Neisseria Gonorrhea: NEGATIVE
Trichomonas: NEGATIVE

## 2021-03-28 ENCOUNTER — Ambulatory Visit: Payer: Medicare Other | Admitting: Family Medicine

## 2021-03-28 ENCOUNTER — Encounter: Payer: Self-pay | Admitting: Family Medicine

## 2021-03-28 NOTE — Progress Notes (Deleted)
No chief complaint on file.    HISTORY OF PRESENT ILLNESS: 03/28/21 ALL:  April Knox is a 65 y.o. female here today for follow up for occular migraines. She was seen in consult with [name] 05/2020 and started on Nurtec. Magnesium, riboflavin and CoQ10 also recommended. Since,    HISTORY (copied from [name] previous note)  HPI:  April Knox is a 65 y.o. female here as requested by [name], MD for migraine. PMHx PSVT, anxiety, I reviewed Dr.Hasanaj's notes: Patient has a history of migraines, with aura, also has dull headaches and at appointment in May of this year she had a dull headache for at least a week, she also describes of vision changes unclear possibly an aura, Imitrex and Tylenol help, headaches at that time had been worsening for several weeks, gradual in onset, dull, throbbing, bandlike, bilateral involving the frontal and temporal area 7 out of 10 in severity aggravated by cold, hunger, tea, coffee, sleep deprivation, perfume/[place] and relieved by nothing, physical exam was normal including head, ears, eyes, nose, throat, lungs, heart, abdomen.  She refused Topamax in the past.   Migraines started 8-10 years ago, she was going to the ICU and when she walked through the door she started seeing kaleidoscope in both eyes didn't last long, diagnosed with ocular migraines. Several years ago she had similar episode, lasts 4-5 minutes, she can still see and function, not followed by significant headaches, this last time it was 3 months, she had a dull headache, she is under a lot of stress due to covid and lost house and job, usually tylenol helps, she opened a blind and she started seeing the kaleidoscope. Lasted 5 minutes and resolved on its own. But her vision in her left eye was seeing floaters. She has has migraine with head pain years ago but usually its just vision changes that resolve and happened 3x in the last 10 years. Eye exam was essentially normal  included dilated exam. But she feels her left eye vision is still impaired, she is seeing floaters and she can't focus in her left eye, sensitivity to light. She saw [name] but she says he didn't so anything, she had a cat scan, she did not tolerate Topamax and she just wants to make sure everything is ok, he told her to stop Topamax. She declined any medication. She has stopped the Topamax and see no difference, not better or worse, but she had been taking the Topamax for 3 days only, she took Nurtec and she felt better. She does not get headaches often at all. She has vision loss left eye. She takes Magnesium daily. Currently doesn't feel can see as well out the left eye, having floaters and focusing problems, sensitivity to light and subjective vision loss no headache, no jaw pain, no tenderness sin the temples, neck pain.Nurtec helped. Not eating too well, just snacking. No other focal neurologic deficits, associated symptoms, inciting events or modifiable factors.   Reviewed notes, labs and imaging from outside physicians, which showed:   01/2020; TSH normal, HgbA1c 5.9   Requested CT scan from [name] to review. Reviewed imaging on CD 02-19-2020 and report on PACS:   CT head showed No acute intracranial abnormalities including mass lesion or mass effect, hydrocephalus, extra-axial fluid collection, midline shift, hemorrhage, or acute infarction, large ischemic events (personally reviewed images). Assymmetry of the ventricles likely not clinically significant.   Personally reviewed family eye care notes and evaluation,  patient was seen for left eye vision loss, floaters, complaining about decreased vision in left eye, redness, pain in the eye, examination included tonometry external exam, slit-lamp exam, diagnosed with recurrence of ocular migraine symptoms, dilated exam and visual field testing unremarkable, recommended further evaluation with neurologist and MRI.   REVIEW OF SYSTEMS: Out  of a complete 14 system review of symptoms, the patient complains only of the following symptoms, and all other reviewed systems are negative.   ALLERGIES: Allergies  Allergen Reactions   Fish Allergy Anaphylaxis and Swelling    Throat closes.   Bactrim Ds [Sulfamethoxazole-Trimethoprim]     Hives    Ciprocinonide [Fluocinolone] Hives    " made me feel awful"    Codeine Other (See Comments)    DROPS PT. BLOOD PRESSURE   Penicillins     Has patient had a PCN reaction causing immediate rash, facial/tongue/throat swelling, SOB or lightheadedness with hypotension:unsure Has patient had a PCN reaction causing severe rash involving mucus membranes or skin necrosis:unsure Has patient had a PCN reaction that required hospitalization:No Has patient had a PCN reaction occurring within the last 10 years:No If all of the above answers are "NO", then may proceed with Cephalosporin use. Childhood reaction    Sulfa Antibiotics Hives, Diarrhea, Nausea Only and Rash     HOME MEDICATIONS: Outpatient Medications Prior to Visit  Medication Sig Dispense Refill   acetaminophen (TYLENOL) 500 MG tablet Take 500 mg by mouth every 6 (six) hours as needed (for pain).      ALPRAZolam (XANAX) 1 MG tablet TAKE ONE TABLET BY MOUTH TWICE DAILY AS NEEDED FOR ANXIETY 40 tablet 0   estradiol (ESTRACE) 0.1 MG/GM vaginal cream INSERT ONE APPLICATORFUL VAGINALLY THREE TIMES A WEEK 42.5 g 3   hydrochlorothiazide (HYDRODIURIL) 25 MG tablet Take 25 mg by mouth daily.     levocetirizine (XYZAL) 5 MG tablet Take 5 mg by mouth daily. In the morning per the patient.     metoprolol succinate (TOPROL XL) 25 MG 24 hr tablet Take 1 tablet (25 mg total) by mouth daily. 90 tablet 3   metroNIDAZOLE (FLAGYL) 500 MG tablet TAKE ONE TABLET BY MOUTH TWICE DAILY 14 tablet 0   omeprazole (PRILOSEC) 20 MG capsule Take 1 capsule (20 mg total) by mouth daily. 60 capsule 5   Rimegepant Sulfate (NURTEC) 75 MG TBDP Take 75 mg by mouth daily  as needed. Dispense 10 or max allowed by insurance. Triptans contraindicated due to retinal migraines 10 tablet 6   rosuvastatin (CRESTOR) 10 MG tablet Take 1 tablet (10 mg total) by mouth daily. 90 tablet 3   No facility-administered medications prior to visit.     PAST MEDICAL HISTORY: Past Medical History:  Diagnosis Date   Abnormal Pap smear    Abnormal Papanicolaou smear of cervix with positive human papilloma virus (HPV) test 04/06/2018   Pap was LSIL with +HPV, will need colpo____   Anxiety    Back pain 03/07/2014   Cervical spine disease    Mild   Chest pain    Elevated cholesterol    Ganglion cyst of left foot 12/06/2013   H/O bilateral breast implants 03/03/2013   Hematuria 03/28/2014   Hot flashes 12/06/2013   Hypertension    Menopausal symptoms 12/06/2013   Mental disorder    anxiety   Migraine with visual aura    Osteopenia after menopause 10/06/2018   DEXA 09/28/2018, osteopenia, take calcium vitamin D and stay active    PSVT (paroxysmal supraventricular  tachycardia) ([place])    Documented by event recorder   Rectocele 03/03/2013   RUQ pain 03/07/2014   Vaginal discharge 02/24/2014   Vaginal dryness, menopausal 12/06/2013   Vaginal Pap smear, abnormal    Yeast infection 02/24/2014     PAST SURGICAL HISTORY: Past Surgical History:  Procedure Laterality Date   BIOPSY N/A 12/16/2019   Procedure: BIOPSY;  Surgeon: Rogene Houston, MD;  [facility];  Service: Endoscopy;  Laterality: N/A;   BREAST ENHANCEMENT SURGERY     COLONOSCOPY N/A 06/25/2016   Procedure: COLONOSCOPY;  Surgeon: Rogene Houston, MD;  Location: [facility];  Service: Endoscopy;  Laterality: N/A;  1:00-moved to 1230 Ann to notify pt   ESOPHAGOGASTRODUODENOSCOPY (EGD) WITH PROPOFOL N/A 12/16/2019   Procedure: ESOPHAGOGASTRODUODENOSCOPY (EGD) WITH PROPOFOL;  Surgeon: Rogene Houston, MD;  [facility]: [facility];  Service: Endoscopy;  Laterality: N/A;  830   POLYPECTOMY  06/25/2016   Procedure:  POLYPECTOMY;  Surgeon: Rogene Houston, MD;  Location: [facility];  Service: Endoscopy;;  colon    toe biopsy       FAMILY HISTORY: Family History  Problem Relation Age of Onset   Dementia Father    Cancer Mother    Migraines Mother    Healthy Daughter    Healthy Daughter    Healthy Daughter    Cancer Maternal Grandmother    Heart disease Maternal Grandmother    Diabetes Maternal Grandfather    Heart attack Paternal Grandmother      SOCIAL HISTORY: Social History   Socioeconomic History   Marital status: Widowed    Spouse name: Not on file   Number of children: 3   Years of education: Not on file   Highest education level: Not on file  Occupational History   Occupation: Unemployed  Tobacco Use   Smoking status: Never   Smokeless tobacco: Never  Vaping Use   Vaping Use: Never used  Substance and Sexual Activity   Alcohol use: No    Alcohol/week: 0.0 standard drinks   Drug use: No   Sexual activity: Yes    Birth control/protection: Post-menopausal  Other Topics Concern   Not on file  Social History Narrative   Lives alone   Right handed   Caffeine: none   Social Determinants of Health   Financial Resource Strain: Medium Risk   Difficulty of Paying Living Expenses: Somewhat hard  Food Insecurity: Food Insecurity Present   Worried About Running Out of Food in the Last Year: Sometimes true   Ran Out of Food in the Last Year: Sometimes true  Transportation Needs: No Transportation Needs   Lack of Transportation (Medical): No   Lack of Transportation (Non-Medical): No  Physical Activity: Inactive   Days of Exercise per Week: 4 days   Minutes of Exercise per Session: 0 min  Stress: Stress Concern Present   Feeling of Stress : To some extent  Social Connections: Socially Isolated   Frequency of Communication with Friends and Family: More than three times a week   Frequency of Social Gatherings with Friends and Family: Never   Attends Religious Services:  Never   Marine scientist or Organizations: No   Attends Archivist Meetings: Never   Marital Status: Widowed  Human resources officer Violence: Not At Risk   Fear of Current or Ex-Partner: No   Emotionally Abused: No   Physically Abused: No   Sexually Abused: No     PHYSICAL EXAM  There were no vitals filed for this visit. There is no height or weight on file to calculate BMI.   Generalized: Well developed, in no acute distress  Cardiology: normal rate and rhythm, no murmur auscultated  Respiratory: clear to auscultation bilaterally    Neurological examination  Mentation: Alert oriented to time, place, history taking. Follows all commands speech and language fluent Cranial nerve II-XII: Pupils were equal round reactive to light. Extraocular movements were full, visual field were full on confrontational test. Facial sensation and strength were normal. Uvula tongue midline. Head turning and shoulder shrug  were normal and symmetric. Motor: The motor testing reveals 5 over 5 strength of all 4 extremities. Good symmetric motor tone is noted throughout.  Sensory: Sensory testing is intact to soft touch on all 4 extremities. No evidence of extinction is noted.  Coordination: Cerebellar testing reveals good finger-nose-finger and heel-to-shin bilaterally.  Gait and station: Gait is normal. Tandem gait is normal. Romberg is negative. No drift is seen.  Reflexes: Deep tendon reflexes are symmetric and normal bilaterally.    DIAGNOSTIC DATA (LABS, IMAGING, TESTING) - I reviewed patient records, labs, notes, testing and imaging myself where available.  Lab Results  Component Value Date   WBC 7.2 05/15/2020   HGB 14.9 05/15/2020   HCT 44.2 05/15/2020   MCV 97 05/15/2020   PLT 267 05/15/2020      Component Value Date/Time   NA 143 05/15/2020 1049   K 4.0 05/15/2020 1049   CL 99 05/15/2020 1049   CO2 30 (H) 05/15/2020 1049   GLUCOSE 98 05/15/2020 1049   GLUCOSE 85  12/13/2019 1433   BUN 14 05/15/2020 1049   CREATININE 0.70 06/21/2020 0919   CREATININE 0.61 11/01/2013 1428   CALCIUM 9.7 05/15/2020 1049   PROT 7.3 05/15/2020 1049   ALBUMIN 4.6 05/15/2020 1049   AST 19 05/15/2020 1049   ALT 13 05/15/2020 1049   ALKPHOS 92 05/15/2020 1049   BILITOT 0.5 05/15/2020 1049   GFRNONAA 98 05/15/2020 1049   GFRAA 113 05/15/2020 1049   Lab Results  Component Value Date   CHOL 200 (H) 11/11/2017   HDL 69 11/11/2017   LDLCALC 122 (H) 11/11/2017   TRIG 45 11/11/2017   CHOLHDL 2.9 11/11/2017   Lab Results  Component Value Date   HGBA1C 6.0 (H) 04/01/2018   Lab Results  Component Value Date   VITAMINB12 >2000 (H) 05/15/2020   Lab Results  Component Value Date   TSH 0.833 11/11/2017    No flowsheet data found.   No flowsheet data found.   ASSESSMENT AND PLAN  65 y.o. year old female  has a past medical history of Abnormal Pap smear, Abnormal Papanicolaou smear of cervix with positive human papilloma virus (HPV) test (04/06/2018), Anxiety, Back pain (03/07/2014), Cervical spine disease, Chest pain, Elevated cholesterol, Ganglion cyst of left foot (12/06/2013), H/O bilateral breast implants (03/03/2013), Hematuria (03/28/2014), Hot flashes (12/06/2013), Hypertension, Menopausal symptoms (12/06/2013), Mental disorder, Migraine with visual aura, Osteopenia after menopause (10/06/2018), PSVT (paroxysmal supraventricular tachycardia) ([place]), Rectocele (03/03/2013), RUQ pain (03/07/2014), Vaginal discharge (02/24/2014), Vaginal dryness, menopausal (12/06/2013), Vaginal Pap smear, abnormal, and Yeast infection (02/24/2014). here with    No diagnosis found.   No orders of the defined types were placed in this encounter.    No orders of the defined types were placed in this encounter.     Debbora Presto, MSN, FNP-C 03/28/2021, 2:11 PM  [facility] [address] [place], [place] [address] [phone]  [phone]   

## 2021-03-30 IMAGING — MR MR HEAD WO/W CM
12 of 14 series · 36 of 48 positions shown · IV contrast (gadavist)
Comparison: None.

CLINICAL DATA: Monocular vision loss

EXAM:
MRI HEAD WITHOUT AND WITH CONTRAST
MRA HEAD WITHOUT CONTRAST
MRA NECK WITHOUT AND WITH CONTRAST
TECHNIQUE: Multiplanar, multiecho pulse sequences of the brain and surrounding
structures were obtained without and with intravenous contrast.
Angiographic images of the Circle of Willis were obtained using MRA
technique without intravenous contrast. Angiographic images of the
neck were obtained using MRA technique without and with intravenous
contrast. Carotid stenosis measurements (when applicable) are
obtained utilizing NASCET criteria, using the distal internal
carotid diameter as the denominator.
CONTRAST:  5mL GADAVIST GADOBUTROL 1 MMOL/ML IV SOLN

[Series 5: DWI · axial · 4.0mm · 0.88mm/px · z∈[-101,+38]mm · 4 of 36 slices shown (1 of 6)]
[im 1/36]
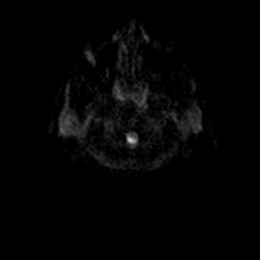
[im 12/36]
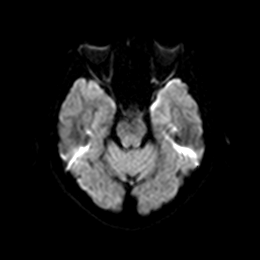
[im 24/36]
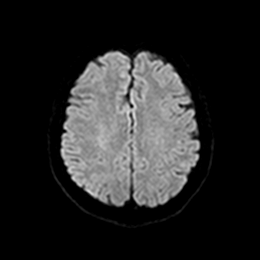
[im 36/36]
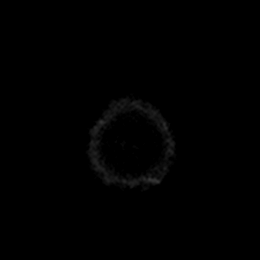

[Series 5: DWI · axial · 4.0mm · 0.88mm/px · z∈[-101,+38]mm · 4 of 36 slices shown (2 of 6)]
[im 1/36]
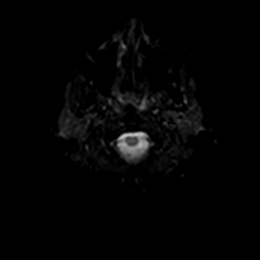
[im 12/36]
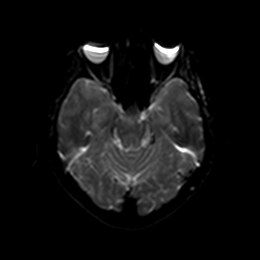
[im 24/36]
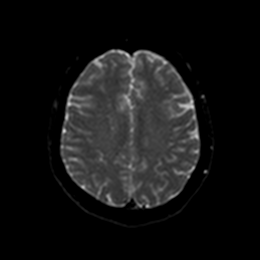
[im 36/36]
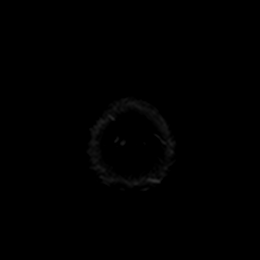

[Series 6: DWI · axial · 4.0mm · 0.88mm/px · z∈[-101,+38]mm · 4 of 36 slices shown (3 of 6)]
[im 1/36]
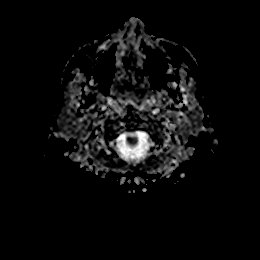
[im 12/36]
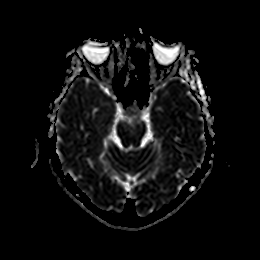
[im 24/36]
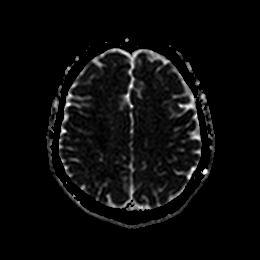
[im 36/36]
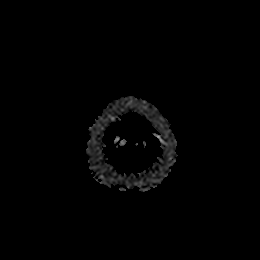

[Series 7: DWI · coronal · 4.0mm · 0.88mm/px · 3 of 32 slices shown (4 of 6)]
[im 1/32]
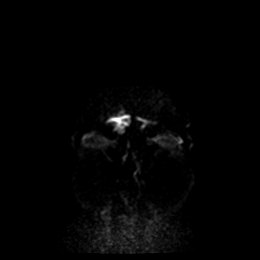
[im 16/32]
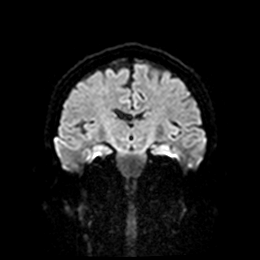
[im 32/32]
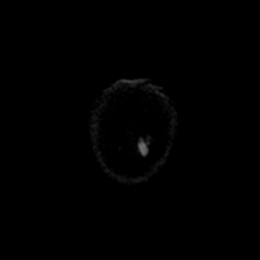

[Series 7: DWI · coronal · 4.0mm · 0.88mm/px · 3 of 32 slices shown (5 of 6)]
[im 1/32]
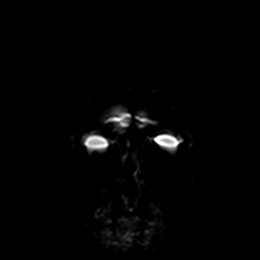
[im 16/32]
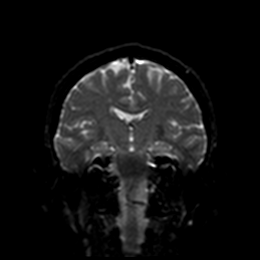
[im 32/32]
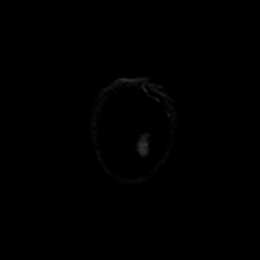

[Series 8: DWI · coronal · 4.0mm · 0.88mm/px · 3 of 32 slices shown (6 of 6)]
[im 1/32]
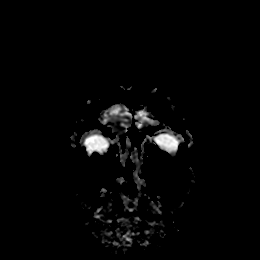
[im 16/32]
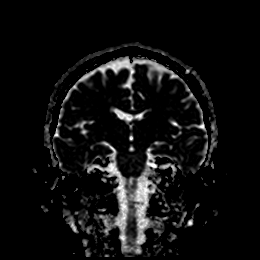
[im 32/32]
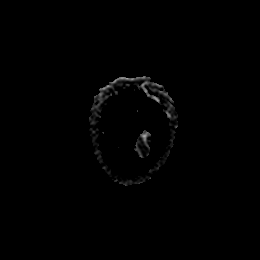

[Series 9: T1 · sagittal · 5.0mm · 0.94mm/px · 2 of 25 slices shown]
[im 1/25]
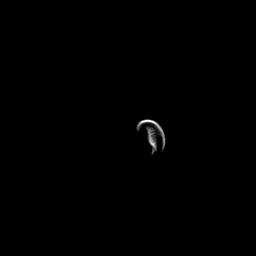
[im 25/25]
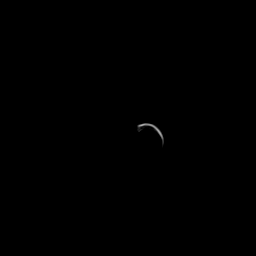

[Series 10: T2 · axial · 5.0mm · 0.72mm/px · z∈[-97,+35]mm · 2 of 20 slices shown (1 of 2)]
[im 1/20]
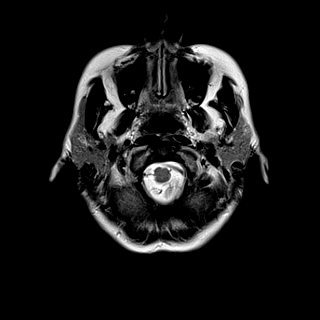
[im 20/20]
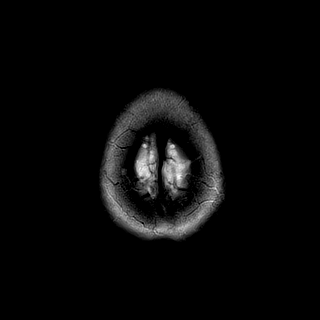

[Series 11: ax hemo · axial · 5.0mm · 0.86mm/px · z∈[-102,+41]mm · 2 of 25 slices shown]
[im 1/25]
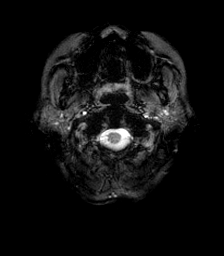
[im 25/25]
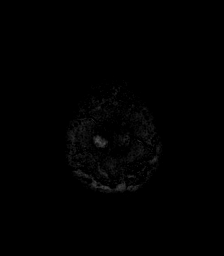

[Series 12: FLAIR · axial · 4.0mm · 0.43mm/px · z∈[-92,+31]mm · 3 of 32 slices shown]
[im 1/32]
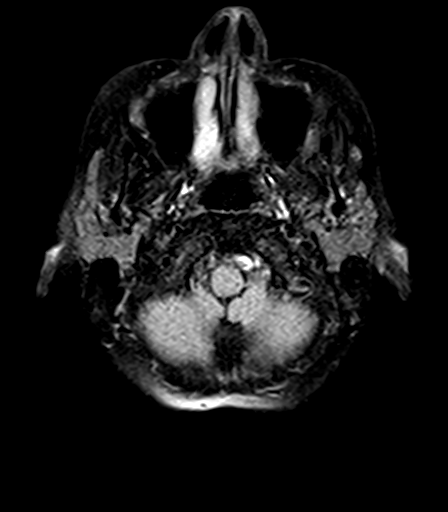
[im 16/32]
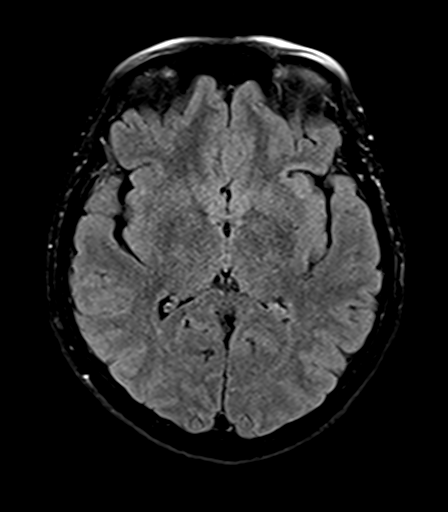
[im 32/32]
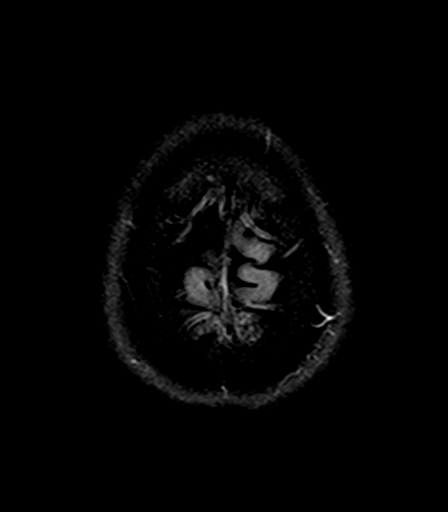

[Series 14: T2 · coronal · 5.0mm · 0.72mm/px · 3 of 28 slices shown (2 of 2)]
[im 1/28]
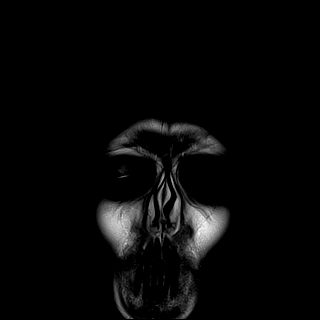
[im 14/28]
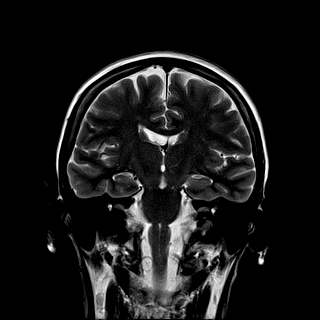
[im 28/28]
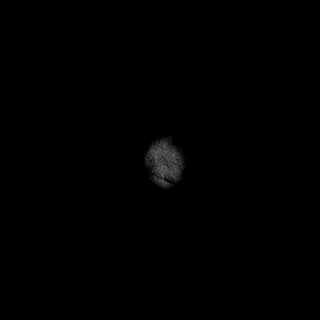

[Series 16: T1 post-contrast · coronal · 5.0mm · 0.34mm/px · 3 of 29 slices shown]
[im 1/29]
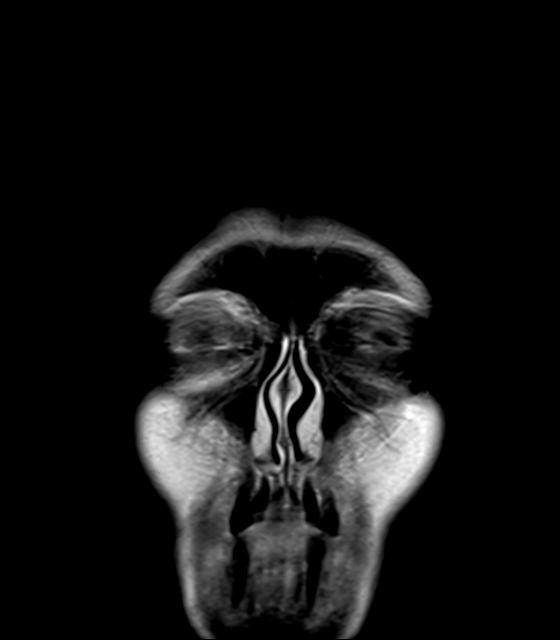
[im 15/29]
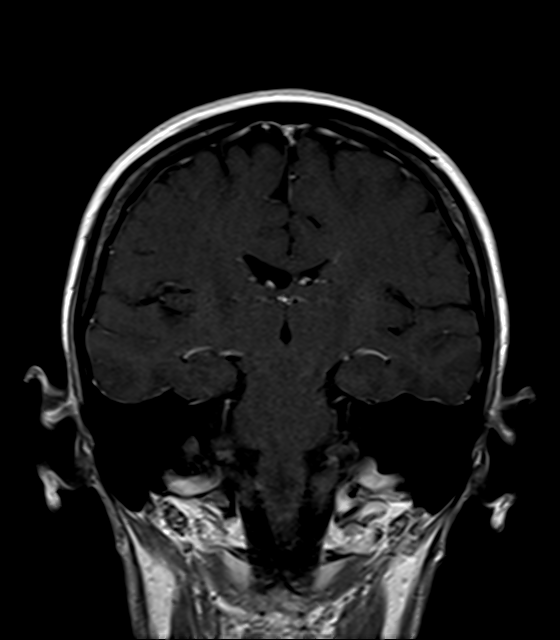
[im 29/29]
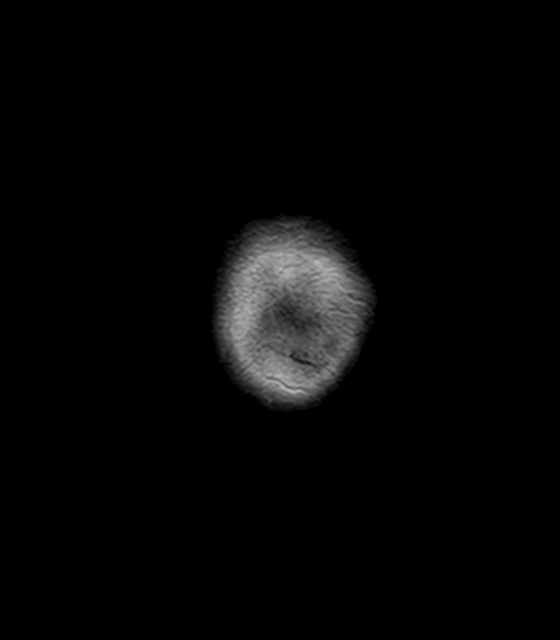

[36 of 48 positions shown; findings below may reference images not displayed]

FINDINGS: MRI HEAD FINDINGS

Brain: No acute infarct, acute hemorrhage or extra-axial collection.
A few scattered foci of N5N1-hyperintensity in the white matter.
Normal volume of CSF spaces. No chronic microhemorrhage. Normal
midline structures. There is no abnormal contrast enhancement.

Vascular: Normal flow voids.

Skull and upper cervical spine: Normal marrow signal.

Sinuses/Orbits: Negative.

Other: None.

MRA HEAD FINDINGS

MRA is motion degraded.

POSTERIOR CIRCULATION:

--Vertebral arteries: Normal V4 segments.

--Inferior cerebellar arteries: Normal.

--Basilar artery: Normal.

--Superior cerebellar arteries: Normal.

--Posterior cerebral arteries: The right PCA is predominantly
supplied by the posterior communicating artery. The left PCA is
partially supplied by its P-comm. There is apparent cut off of the
right PCA P3 segment (series 5 image 128-131).

ANTERIOR CIRCULATION:

--Intracranial internal carotid arteries: Normal.

--Anterior cerebral arteries (ACA): Normal. Absent right A1 segment,
normal variant

--Middle cerebral arteries (MCA): Normal.

MRA NECK FINDINGS

There is venous contamination on this portion of the study. Within
that limitation, there is no hemodynamically significant stenosis of
the internal carotid arteries by NASCET criteria. The vertebral
arteries are both patent to the skull base.
IMPRESSION: 1. No acute intracranial abnormality.
2. Motion degraded MRA of the head and neck. No emergent large
vessel occlusion or high-grade stenosis. Cut off of the right PCA P3
segment may be artifactual and would be more in keeping with a
visual field defect rather than monocular vision loss.
3. A few scattered foci of N5N1-hyperintensity in the white matter
are nonspecific and most commonly indicate chronic small vessel
ischemia.

## 2021-04-29 ENCOUNTER — Other Ambulatory Visit: Payer: Self-pay | Admitting: Adult Health

## 2021-05-03 ENCOUNTER — Other Ambulatory Visit: Payer: Self-pay | Admitting: Adult Health

## 2021-05-13 ENCOUNTER — Other Ambulatory Visit: Payer: Medicare Other | Admitting: Adult Health

## 2021-05-24 ENCOUNTER — Other Ambulatory Visit: Payer: Self-pay | Admitting: Adult Health

## 2021-06-04 ENCOUNTER — Other Ambulatory Visit (INDEPENDENT_AMBULATORY_CARE_PROVIDER_SITE_OTHER): Payer: Self-pay | Admitting: Gastroenterology

## 2021-06-04 DIAGNOSIS — K219 Gastro-esophageal reflux disease without esophagitis: Secondary | ICD-10-CM

## 2021-06-06 ENCOUNTER — Telehealth: Payer: Self-pay | Admitting: Cardiology

## 2021-06-06 NOTE — Telephone Encounter (Signed)
Patient states she was taking toprol 25 mg and palpitations were under control. She asks if she should decrease dose back to 12.5 mg or switch to a different medication.      I will forward to Dr.Branch.

## 2021-06-06 NOTE — Telephone Encounter (Signed)
Pt called stating that she started on to slow her heart rate has caused her to gain 10lbs   Please call pt she's concerned

## 2021-06-10 MED ORDER — METOPROLOL SUCCINATE ER 25 MG PO TB24: 12.5000 mg | ORAL_TABLET | Freq: Every day | ORAL | 3 refills | Status: DC

## 2021-06-10 NOTE — Telephone Encounter (Signed)
Can try lowering toprol to 12.5mg  daily and keep [place] updated  Zandra Abts MD

## 2021-06-10 NOTE — Telephone Encounter (Signed)
Pt notified and verbalized understanding. Pt will keep office updated.

## 2021-06-24 ENCOUNTER — Ambulatory Visit (INDEPENDENT_AMBULATORY_CARE_PROVIDER_SITE_OTHER): Payer: Medicare Other | Admitting: Adult Health

## 2021-06-24 ENCOUNTER — Encounter: Payer: Self-pay | Admitting: Adult Health

## 2021-06-24 ENCOUNTER — Other Ambulatory Visit: Payer: Self-pay

## 2021-06-24 ENCOUNTER — Other Ambulatory Visit (HOSPITAL_COMMUNITY)
Admission: RE | Admit: 2021-06-24 | Discharge: 2021-06-24 | Disposition: A | Discharge status: Home / Self Care | Payer: Medicare Other | Source: Ambulatory Visit | Attending: Adult Health | Admitting: Adult Health

## 2021-06-24 VITALS — BP 139/78 | HR 71 | Ht 67.0 in | Wt 132.5 lb

## 2021-06-24 DIAGNOSIS — Z01419 Encounter for gynecological examination (general) (routine) without abnormal findings: Secondary | ICD-10-CM

## 2021-06-24 DIAGNOSIS — R350 Frequency of micturition: Secondary | ICD-10-CM | POA: Diagnosis not present

## 2021-06-24 DIAGNOSIS — R635 Abnormal weight gain: Secondary | ICD-10-CM

## 2021-06-24 DIAGNOSIS — F419 Anxiety disorder, unspecified: Secondary | ICD-10-CM | POA: Diagnosis not present

## 2021-06-24 DIAGNOSIS — Z1211 Encounter for screening for malignant neoplasm of colon: Secondary | ICD-10-CM | POA: Diagnosis not present

## 2021-06-24 DIAGNOSIS — N816 Rectocele: Secondary | ICD-10-CM

## 2021-06-24 DIAGNOSIS — Z1151 Encounter for screening for human papillomavirus (HPV): Secondary | ICD-10-CM | POA: Insufficient documentation

## 2021-06-24 DIAGNOSIS — Z8742 Personal history of other diseases of the female genital tract: Secondary | ICD-10-CM

## 2021-06-24 LAB — POCT URINALYSIS DIPSTICK OB
Blood, UA: NEGATIVE
Glucose, UA: NEGATIVE
Ketones, UA: NEGATIVE
Leukocytes, UA: NEGATIVE
Nitrite, UA: NEGATIVE
POC,PROTEIN,UA: NEGATIVE

## 2021-06-24 LAB — HEMOCCULT GUIAC POC 1CARD (OFFICE): Fecal Occult Blood, POC: NEGATIVE

## 2021-06-24 MED ORDER — ALPRAZOLAM 1 MG PO TABS: 1.0000 mg | ORAL_TABLET | Freq: Two times a day (BID) | ORAL | 0 refills | Status: DC | PRN

## 2021-06-24 MED ORDER — ESTRADIOL 0.1 MG/GM VA CREA: TOPICAL_CREAM | VAGINAL | 3 refills | Status: DC

## 2021-06-24 NOTE — Progress Notes (Signed)
[name], female   DOB: [birth year], 65 y.o.   MRN: [id number] History of Present Illness: Chaylee is a 65 year old white female,widowed, PM in for a well woman gyn exam and pap.Had abnormal pap 05/09/20. Has seen cardiology and on Toprol.  PCP is Dr Sherrie Sport.    Current Medications, Allergies, Past Medical History, Past Surgical History, Family History and Social History were reviewed in [employer] record.     Review of Systems:  Patient denies any headaches, hearing loss, fatigue, blurred vision, shortness of breath, chest pain, abdominal pain, problems with bowel movements, urination,has frequency at times, or intercourse. No joint pain or mood swings.  +anxiety, has filed chapter 13 and car not working well, works 2 jobs Has gained 4 lbs  Physical Exam:BP 139/78 (BP Location: Left Arm, Patient Position: Sitting, Cuff Size: Normal)   Pulse 71   Ht 5\' 7"  (1.702 m)   Wt 132 lb 8 oz (60.1 kg)   BMI 20.75 kg/m  Urine dipstick negative General:  Well developed, well nourished, no acute distress Skin:  Warm and dry Neck:  Midline trachea, normal thyroid, good ROM, no lymphadenopathy,no carotid bruits heard Lungs; Clear to auscultation bilaterally Breast:  No dominant palpable mass, retraction, or nipple discharge,has bilateral implants Cardiovascular: Regular rate and rhythm Abdomen:  Soft, non tender, no hepatosplenomegaly Pelvic:  External genitalia is normal in appearance, no lesions.  The vagina is pale with loss of rugae, uses PVC occasionally. Urethra has no lesions or masses. The cervix is smooth.  Uterus is felt to be normal size, shape, and contour.  No adnexal masses or tenderness noted.Bladder is non tender, no masses felt. Rectal: Good sphincter tone, no polyps, or hemorrhoids felt.  Hemoccult negative.+mild low rectocele. Extremities/musculoskeletal:  No swelling or varicosities noted, no clubbing or cyanosis Psych:  No mood changes,  alert and cooperative,seems happy AA is 0  Fall risk is low Depression screen [facility] 2/9 06/24/2021 05/09/2020 04/01/2018  Decreased Interest 0 2 0  Down, Depressed, Hopeless 0 1 0  PHQ - 2 Score 0 3 0  Altered sleeping - 1 0  Tired, decreased energy - 1 0  Change in appetite - 1 0  Feeling bad or failure about yourself  - 2 0  Trouble concentrating - 0 0  Moving slowly or fidgety/restless - 0 0  Suicidal thoughts - 0 0  PHQ-9 Score - 8 0  Difficult doing work/chores - Not difficult at all Not difficult at all  Some recent data might be hidden     GAD 7 : Generalized Anxiety Score 06/24/2021 05/09/2020  Nervous, Anxious, on Edge 1 0  Control/stop worrying 1 0  Worry too much - different things 1 0  Trouble relaxing 1 0  Restless 0 0  Easily annoyed or irritable 0 0  Afraid - awful might happen 0 0  Total GAD 7 Score 4 0  Anxiety Difficulty Not difficult at all Not difficult at all   .  Upstream - 06/24/21 1205       Pregnancy Intention Screening   Does the patient want to become pregnant in the next year? N/A    Does the patient's partner want to become pregnant in the next year? N/A    Would the patient like to discuss contraceptive options today? N/A      Contraception Wrap Up   Current Method --   PM   End Method --   PM   Contraception  Counseling Provided No             Examination chaperoned by Celene Squibb LPN   Impression and Plan: 1. Encounter for gynecological examination with Papanicolaou smear of cervix Pap sent Physical in 1 year Pap in 3 if normal Mammogram yearly Colonoscopy per GI Had labs with PCP  2. Urinary frequency  3. Encounter for screening fecal occult blood testing  4. Anxiety Takes xanax 1-2 x daily Meds ordered this encounter  Medications   ALPRAZolam (XANAX) 1 MG tablet    Sig: Take 1 tablet (1 mg total) by mouth 2 (two) times daily as needed. for anxiety    Dispense:  40 tablet    Refill:  0    Order Specific Question:    Supervising Provider    Answer:   Elonda Husky, LUTHER H [[id number]]   estradiol (ESTRACE) 0.1 MG/GM vaginal cream    Sig: INSERT ONE APPLICATORFUL VAGINALLY THREE TIMES A WEEK    Dispense:  42.5 g    Refill:  3    This prescription was filled on 02/26/2021. Any refills authorized will be placed on file.    Order Specific Question:   Supervising Provider    Answer:   Tania Ade H [[id number]]     5. Weight gain Check TSH  6. Rectocele  7. History of abnormal cervical Pap smear Pap sent

## 2021-06-25 ENCOUNTER — Other Ambulatory Visit: Payer: Medicare Other | Admitting: Adult Health

## 2021-06-25 LAB — TSH: TSH: 0.405 u[IU]/mL — ABNORMAL LOW (ref 0.450–4.500)

## 2021-06-27 ENCOUNTER — Telehealth: Payer: Self-pay | Admitting: Adult Health

## 2021-06-27 LAB — CYTOLOGY - PAP
Comment: NEGATIVE
Comment: NEGATIVE
HPV 16: POSITIVE — AB
HPV 18 / 45: NEGATIVE
High risk HPV: POSITIVE — AB

## 2021-06-27 NOTE — Telephone Encounter (Signed)
Pt aware of pap and appt made with Dr Elonda Husky for 07/04/21 for colpo

## 2021-06-27 NOTE — Telephone Encounter (Signed)
Patient is calling asking if someone would call her back to discuss the pap results she doesn't remember what she was told

## 2021-06-28 ENCOUNTER — Telehealth: Payer: Self-pay | Admitting: Adult Health

## 2021-06-28 NOTE — Telephone Encounter (Signed)
Pt to get colposcopy 07/04/21, tried to explain pap in more detail.

## 2021-07-04 ENCOUNTER — Ambulatory Visit (INDEPENDENT_AMBULATORY_CARE_PROVIDER_SITE_OTHER): Payer: Medicare Other | Admitting: Obstetrics & Gynecology

## 2021-07-04 ENCOUNTER — Encounter: Payer: Self-pay | Admitting: Obstetrics & Gynecology

## 2021-07-04 ENCOUNTER — Other Ambulatory Visit: Payer: Self-pay

## 2021-07-04 VITALS — BP 133/76 | HR 73 | Ht 67.0 in | Wt 134.0 lb

## 2021-07-04 DIAGNOSIS — N870 Mild cervical dysplasia: Secondary | ICD-10-CM | POA: Diagnosis not present

## 2021-07-04 NOTE — Progress Notes (Signed)
    Colposcopy Procedure Note: 2017/2019 normal 2021 LSIL + HPV negative 16/18 2022 LSIL + HPV +16 negative 18/45 Colposcopy Procedure Note      2019 ASCCP recommendation:colposcopy  Smoker:  No. New sexual partner:  Yes.    : time frame:  2 years  History of abnormal Pap: not before last year  Procedure Details  The risks and benefits of the procedure and Written informed consent obtained.  Speculum placed in vagina and excellent visualization of cervix achieved, cervix swabbed x 3 with acetic acid solution.  Findings: Adequate colposcopy is noted today.  Cervix: no visible lesions, no mosaicism, no punctation, and no abnormal vasculature; no biopsies taken. Vaginal inspection: vaginal colposcopy not performed. Vulvar colposcopy: normal mucosa without lesions.  Specimens: none  Complications: none.  Colposcopic Impression: Adequate colposcopy, normal findings  Plan(Based on 2019 ASCCP recommendations) Repeat HPV based cytology in 1 year

## 2021-07-05 ENCOUNTER — Telehealth: Payer: Self-pay | Admitting: Obstetrics & Gynecology

## 2021-07-05 NOTE — Telephone Encounter (Signed)
Patient is concerned about the 9lbs she has gained over the year and wondered if she needed to be concerned with it. She told me to tell you in her words that "she didn't want to blow up like a plum".

## 2021-07-05 NOTE — Telephone Encounter (Signed)
Patient states she has gained 9 pounds in the last year.   Has not heard back from her thyroid labs.  Read note on lab by [name] to patient.  Informed metabolism slows as we age and she may need to be more mindful of food choices.  Pt verbalized understanding with no further questions.

## 2021-07-11 ENCOUNTER — Telehealth: Payer: Self-pay | Admitting: Cardiology

## 2021-07-11 NOTE — Telephone Encounter (Signed)
Denies chest pain, dizziness or sob Advised to contact PCP with these symptoms Verbalized understanding of plan

## 2021-07-11 NOTE — Telephone Encounter (Signed)
Agree with pcp evaluatin   Zandra Abts MD

## 2021-07-11 NOTE — Telephone Encounter (Signed)
Patient states she was driving last night and had sudden overwhelming weak feeling. She says it lasted a few seconds. She says it felt like her arms were so weak she could not raise it. She says it scared her and she has never had it before or since. She says she had no numbness and no pressure. She says she sleeps on the right side of her body and sometimes when she gets up in the night it hurts her on the right side of her chest. She says she is unsure if it is just because of how she is laying.  She says she has not said anything to her PCP yet about it. She also says she has not had an echo in many years for her valve. She says she also had stress test at [facility].

## 2021-07-19 ENCOUNTER — Other Ambulatory Visit: Payer: Self-pay | Admitting: Adult Health

## 2021-07-29 ENCOUNTER — Telehealth: Payer: Self-pay | Admitting: Adult Health

## 2021-07-29 MED ORDER — METRONIDAZOLE 500 MG PO TABS: 500.0000 mg | ORAL_TABLET | Freq: Two times a day (BID) | ORAL | 0 refills | Status: DC

## 2021-07-29 NOTE — Telephone Encounter (Signed)
Has discharge with odor, will send in rx for flagyl

## 2021-07-29 NOTE — Telephone Encounter (Signed)
Pt has noticed an odor & some discharge No pain Took AZO OTC, didn't help  Can we order same Rx we sent to [facility] for her before, it really helped   [name]

## 2021-08-12 ENCOUNTER — Encounter: Payer: Self-pay | Admitting: Cardiology

## 2021-08-12 ENCOUNTER — Ambulatory Visit (INDEPENDENT_AMBULATORY_CARE_PROVIDER_SITE_OTHER): Payer: Medicare Other | Admitting: Cardiology

## 2021-08-12 VITALS — BP 138/80 | HR 62 | Ht 67.0 in | Wt 133.8 lb

## 2021-08-12 DIAGNOSIS — I471 Supraventricular tachycardia: Secondary | ICD-10-CM

## 2021-08-12 DIAGNOSIS — I10 Essential (primary) hypertension: Secondary | ICD-10-CM | POA: Diagnosis not present

## 2021-08-12 MED ORDER — DILTIAZEM HCL 30 MG PO TABS: 30.0000 mg | ORAL_TABLET | Freq: Two times a day (BID) | ORAL | 6 refills | Status: DC

## 2021-08-12 NOTE — Patient Instructions (Addendum)
Medication Instructions:  Stop Toprol XL (Metoprolol Succ) Begin Diltiazem 30mg  twice a day   Continue all other medications.     Labwork: BMET, Mg - orders given today. Office will contact with results via phone or letter.     Testing/Procedures: none  Follow-Up: Your physician wants you to follow up in: 6 months.  You will receive a reminder letter in the mail one-two months in advance.  If you don't receive a letter, please call our office to schedule the follow up appointment    Any Other Special Instructions Will Be Listed Below (If Applicable).   If you need a refill on your cardiac medications before your next appointment, please call your pharmacy.

## 2021-08-12 NOTE — Progress Notes (Signed)
Clinical Summary Ms. Kanno is a 65 y.o.female seen today for follow up of the following medical problems.    1. PSVT - history is unclear from available notes. Notes mention PSVT documented on event recorder however do not see monitor report in our system EKGs reviewed, show normal sinus rhythm         - reported weight gain on toprol 25mg , lowered to 12.5mg  daily. Reports some nonspecific fatigue at times - occaiosnasl palpitations, 1-2 times per week    2. HTN - compliant with meds - from 06/2021 labs with pcp K was 3.3, she is on HCTZ   3. Hyperlipidmeia - compliant with meds - 06/2021 TC 170 TG 113 HDL 82 LDL 86   SH: works at [employer]  Past Medical History:  Diagnosis Date   Abnormal Pap smear    Abnormal Papanicolaou smear of cervix with positive human papilloma virus (HPV) test 04/06/2018   Pap was LSIL with +HPV, will need colpo____   Anxiety    Back pain 03/07/2014   Cervical spine disease    Mild   Chest pain    Elevated cholesterol    Ganglion cyst of left foot 12/06/2013   H/O bilateral breast implants 03/03/2013   Hematuria 03/28/2014   Hot flashes 12/06/2013   Hypertension    Menopausal symptoms 12/06/2013   Mental disorder    anxiety   Migraine with visual aura    Osteopenia after menopause 10/06/2018   DEXA 09/28/2018, osteopenia, take calcium vitamin D and stay active    PSVT (paroxysmal supraventricular tachycardia) ([place])    Documented by event recorder   Rectocele 03/03/2013   RUQ pain 03/07/2014   Vaginal discharge 02/24/2014   Vaginal dryness, menopausal 12/06/2013   Vaginal Pap smear, abnormal    Yeast infection 02/24/2014     Allergies  Allergen Reactions   Fish Allergy Anaphylaxis and Swelling    Throat closes.   Bactrim Ds [Sulfamethoxazole-Trimethoprim]     Hives    Ciprocinonide [Fluocinolone] Hives    " made me feel awful"    Codeine Other (See Comments)    DROPS PT. BLOOD PRESSURE   Penicillins     Has patient had a PCN reaction  causing immediate rash, facial/tongue/throat swelling, SOB or lightheadedness with hypotension:unsure Has patient had a PCN reaction causing severe rash involving mucus membranes or skin necrosis:unsure Has patient had a PCN reaction that required hospitalization:No Has patient had a PCN reaction occurring within the last 10 years:No If all of the above answers are "NO", then may proceed with Cephalosporin use. Childhood reaction    Sulfa Antibiotics Hives, Diarrhea, Nausea Only and Rash     Current Outpatient Medications  Medication Sig Dispense Refill   acetaminophen (TYLENOL) 500 MG tablet Take 500 mg by mouth every 6 (six) hours as needed (for pain).      ALPRAZolam (XANAX) 1 MG tablet TAKE ONE TABLET BY MOUTH TWICE DAILY AS NEEDED FOR ANXIETY 40 tablet 0   estradiol (ESTRACE) 0.1 MG/GM vaginal cream INSERT ONE APPLICATORFUL VAGINALLY THREE TIMES A WEEK 42.5 g 3   hydrochlorothiazide (HYDRODIURIL) 25 MG tablet Take 25 mg by mouth daily.     levocetirizine (XYZAL) 5 MG tablet Take 5 mg by mouth daily. In the morning per the patient.     metoprolol succinate (TOPROL XL) 25 MG 24 hr tablet Take 0.5 tablets (12.5 mg total) by mouth daily. 45 tablet 3   metroNIDAZOLE (FLAGYL) 500 MG tablet Take 1  tablet (500 mg total) by mouth 2 (two) times daily. 14 tablet 0   omeprazole (PRILOSEC) 20 MG capsule TAKE ONE CAPSULE BY MOUTH ONCE DAILY 90 capsule 0   potassium chloride (KLOR-CON) 10 MEQ tablet Take 10 mEq by mouth daily.     Rimegepant Sulfate (NURTEC) 75 MG TBDP Take 75 mg by mouth daily as needed. Dispense 10 or max allowed by insurance. Triptans contraindicated due to retinal migraines 10 tablet 6   No current facility-administered medications for this visit.     Past Surgical History:  Procedure Laterality Date   BIOPSY N/A 12/16/2019   Procedure: BIOPSY;  Surgeon: Rogene Houston, MD;  [facility];  Service: Endoscopy;  Laterality: N/A;   BREAST ENHANCEMENT SURGERY      COLONOSCOPY N/A 06/25/2016   Procedure: COLONOSCOPY;  Surgeon: Rogene Houston, MD;  Location: [facility];  Service: Endoscopy;  Laterality: N/A;  1:00-moved to 1230 Ann to notify pt   ESOPHAGOGASTRODUODENOSCOPY (EGD) WITH PROPOFOL N/A 12/16/2019   Procedure: ESOPHAGOGASTRODUODENOSCOPY (EGD) WITH PROPOFOL;  Surgeon: Rogene Houston, MD;  [facility]: [facility];  Service: Endoscopy;  Laterality: N/A;  830   POLYPECTOMY  06/25/2016   Procedure: POLYPECTOMY;  Surgeon: Rogene Houston, MD;  [facility]: [facility];  Service: Endoscopy;;  colon    toe biopsy       Allergies  Allergen Reactions   Fish Allergy Anaphylaxis and Swelling    Throat closes.   Bactrim Ds [Sulfamethoxazole-Trimethoprim]     Hives    Ciprocinonide [Fluocinolone] Hives    " made me feel awful"    Codeine Other (See Comments)    DROPS PT. BLOOD PRESSURE   Penicillins     Has patient had a PCN reaction causing immediate rash, facial/tongue/throat swelling, SOB or lightheadedness with hypotension:unsure Has patient had a PCN reaction causing severe rash involving mucus membranes or skin necrosis:unsure Has patient had a PCN reaction that required hospitalization:No Has patient had a PCN reaction occurring within the last 10 years:No If all of the above answers are "NO", then may proceed with Cephalosporin use. Childhood reaction    Sulfa Antibiotics Hives, Diarrhea, Nausea Only and Rash      Family History  Problem Relation Age of Onset   Dementia Father    Cancer Mother    Migraines Mother    Healthy Daughter    Healthy Daughter    Healthy Daughter    Cancer Maternal Grandmother    Heart disease Maternal Grandmother    Diabetes Maternal Grandfather    Heart attack Paternal Grandmother      Social History Ms. Rickey reports that she has never smoked. She has never used smokeless tobacco. Ms. Wareing reports no history of alcohol use.   Review of Systems CONSTITUTIONAL: No weight loss,  fever, chills, weakness or fatigue.  HEENT: Eyes: No visual loss, blurred vision, double vision or yellow sclerae.No hearing loss, sneezing, congestion, runny nose or sore throat.  SKIN: No rash or itching.  CARDIOVASCULAR: per hpi RESPIRATORY: No shortness of breath, cough or sputum.  GASTROINTESTINAL: No anorexia, nausea, vomiting or diarrhea. No abdominal pain or blood.  GENITOURINARY: No burning on urination, no polyuria NEUROLOGICAL: No headache, dizziness, syncope, paralysis, ataxia, numbness or tingling in the extremities. No change in bowel or bladder control.  MUSCULOSKELETAL: No muscle, back pain, joint pain or stiffness.  LYMPHATICS: No enlarged nodes. No history of splenectomy.  PSYCHIATRIC: No history of depression or anxiety.  ENDOCRINOLOGIC: No reports  of sweating, cold or heat intolerance. No polyuria or polydipsia.  Marland Kitchen   Physical Examination Today's Vitals   08/12/21 0813  BP: 138/80  Pulse: 62  SpO2: 97%  Weight: 133 lb 12.8 oz (60.7 kg)  Height: 5\' 7"  (1.702 m)   Body mass index is 20.96 kg/m.  Gen: resting comfortably, no acute distress HEENT: no scleral icterus, pupils equal round and reactive, no palptable cervical adenopathy,  CV: RRR, no m/r/g no jvd Resp: Clear to auscultation bilaterally GI: abdomen is soft, non-tender, non-distended, normal bowel sounds, no hepatosplenomegaly MSK: extremities are warm, no edema.  Skin: warm, no rash Neuro:  no focal deficits Psych: appropriate affect   Diagnostic Studies     Assessment and Plan   1. PSVT - long history of palpitations  - reported some weight gain and fatigue on toprol 25mg , lowered to 12.5mg  daily. Still some ongoing symptoms. Will try changing to diltiazem 30mg  bid.    2. HTN -elevated today, follow with change to ditlaizem  3. Hypokalemia - K was 3.3 by 06/2021 pcp labs. Repeat bmet/mg labs. If remains low could increase her daily KCl or change HCTZ to maxide.     Arnoldo Lenis, M.D.

## 2021-08-14 ENCOUNTER — Encounter: Payer: Self-pay | Admitting: Cardiology

## 2021-08-14 ENCOUNTER — Telehealth: Payer: Self-pay | Admitting: Cardiology

## 2021-08-14 NOTE — Telephone Encounter (Signed)
States that she had episode yesterday right after taking the Diltiazem - had achy feeling under breast (bra line) chest area.  Only lasted a bit, then went away & none since.  May have not been related to medication but scared her & she got concerned.  Did take her morning dose - tolerated without any problems.  States that she only has these palpitations, fast heartbeat occasionally & would like to only take as needed if you think would be reasonable for her.

## 2021-08-14 NOTE — Telephone Encounter (Signed)
Pt c/o medication issue:  1. Name of Medication: diltiazem (CARDIZEM) 30 MG tablet  2. How are you currently taking this medication (dosage and times per day)? Take 1 tablet (30 mg total) by mouth 2 (two) times daily.  3. Are you having a reaction (difficulty breathing--STAT)? No   4. What is your medication issue? Patient mentions that after taking one dosage, pain under both breast and up top. Experiencing pain here or there.

## 2021-08-14 NOTE — Telephone Encounter (Signed)
Patient notified and verbalized understanding. 

## 2021-08-14 NOTE — Telephone Encounter (Signed)
Diltiazem in general is a very well tolerated medication, I would suggest giving it some more time daily before we conclude its causing side effects. If willing would keep taking daily and keep [place] updated   Zandra Abts MD

## 2021-08-16 ENCOUNTER — Other Ambulatory Visit: Payer: Self-pay | Admitting: Adult Health

## 2021-08-20 ENCOUNTER — Telehealth: Payer: Self-pay | Admitting: Cardiology

## 2021-08-20 NOTE — Telephone Encounter (Signed)
Patient called and says that its very important that she speaks with a nurse. Please call back

## 2021-08-21 NOTE — Telephone Encounter (Signed)
BP 142/69 & 78 left arm checked when on previous phone call BP 111/75 & 91 right arm checked several minutes later Advised to stop drinking IV liquid drinks since they are used when dehydrated or working out and sweating a lot Advised to continue monitoring BP and stay on same medications  and keep the same follow up plan Verbalized understanding of plan

## 2021-08-21 NOTE — Telephone Encounter (Signed)
Reported numbers are somewhat variable, would need more information to decide if need to make med changes. Can she check numbers once a day or if symptomatic and update [place] early next week. If palpitations over that time period fine to take additional diltiazem 30mg  prn   Zandra Abts MD

## 2021-08-21 NOTE — Telephone Encounter (Signed)
Reports BP and HR was elevated over the weekend. Contacted PCP on Sunday and was told to take an extra diltiazem. Reports drinking 4 packs of liquid IV that she added to water over the weekend and thinks it may have caused it. Says her daughter read side effects of liquid IV and it causes BP to elevate Request BP and HR today and says it hasn't been done. Placed on hold while BP & HR checked and phone disconnected. Tried calling back, no answer, left message to call office back.

## 2021-08-22 ENCOUNTER — Telehealth: Payer: Self-pay | Admitting: *Deleted

## 2021-08-22 ENCOUNTER — Encounter: Payer: Self-pay | Admitting: *Deleted

## 2021-08-22 NOTE — Telephone Encounter (Signed)
-----   Message from Arnoldo Lenis, MD sent at 08/21/2021  4:42 PM EST ----- Normal labs  Zandra Abts MD

## 2021-08-22 NOTE — Telephone Encounter (Signed)
Laurine Blazer, LPN  75/06[birth year]2584  2:77 PM EST Back to Top    Patient notified via letter.  Copy to pcp.

## 2021-09-09 ENCOUNTER — Other Ambulatory Visit: Payer: Self-pay | Admitting: Cardiology

## 2021-09-13 ENCOUNTER — Other Ambulatory Visit: Payer: Self-pay | Admitting: Adult Health

## 2021-10-02 ENCOUNTER — Other Ambulatory Visit: Payer: Self-pay | Admitting: Physician Assistant

## 2021-10-14 ENCOUNTER — Other Ambulatory Visit: Payer: Self-pay | Admitting: Adult Health

## 2021-10-16 ENCOUNTER — Encounter: Payer: Self-pay | Admitting: Adult Health

## 2021-11-11 ENCOUNTER — Other Ambulatory Visit: Payer: Self-pay | Admitting: Adult Health

## 2021-12-05 ENCOUNTER — Other Ambulatory Visit: Payer: Self-pay | Admitting: Adult Health

## 2021-12-13 ENCOUNTER — Telehealth: Payer: Self-pay | Admitting: Cardiology

## 2021-12-13 NOTE — Telephone Encounter (Signed)
Follow Up

## 2021-12-13 NOTE — Telephone Encounter (Signed)
Left message to return call 

## 2021-12-13 NOTE — Telephone Encounter (Signed)
Best options are either toprol or cardizem. If bp was the issue on cardizem we could have increased the dose or added an additional bp medication. Can she update [place] how high bp's were. Is she willing to retry the cardizem perhaps at a different dose ? ?Zandra Abts MD ?

## 2021-12-13 NOTE — Telephone Encounter (Signed)
States that she stopped the Cardizem '30mg'$  twice a day - only took x 4 days.  Was told to go back to Toprol by her pcp.  States that she stopped the Cardizem due her BP going way up when she was taking this - stated that she had to go to urgent care due to elevated BP.  States that she has been back on the Toprol since January.  Thinks this medication is causing her stomach bloating, fatigue, more gas & BM changes.  States this has been going on since January.  States she has gained 9-10 punds - been depressed - and has talked to her pcp as well - states he told her to give it more time.  No BP numbers available ?

## 2021-12-13 NOTE — Telephone Encounter (Signed)
Patient states that she is willing to retry the Cardizem.  States that she did drink a packet of something that went in her water - did have a couple of these in her water around this time frame.  Her grand-son didn't tell her it had a ton of caffeine & vitamins in it.  Asked her to keep log of her readings & call or mychart message back on Monday with update.  She verbalized understanding.   ?

## 2021-12-13 NOTE — Telephone Encounter (Signed)
Pt states that she has been constipated, tired, lack of energy from one of her medications. Pt states she's not sure of which medication it is that's causing this. She would like a callback. Please advise ?

## 2021-12-16 ENCOUNTER — Telehealth: Payer: Self-pay | Admitting: Cardiology

## 2021-12-16 NOTE — Telephone Encounter (Signed)
Pt c/o BP issue: STAT if pt c/o blurred vision, one-sided weakness or slurred speech ? ?1. What are your last 5 BP readings?  ?12/16/21 taken while on the phone 139/71 HR 91(was up moving around right before taking), taken again after other readings were given 122/66 HR 75  ?12/15/21 8:00 AM 108/58 HR 66 12:15 PM 115/61 HR 72  ?10:15 PM 116/70 HR 75 ?12/14/21 7:30 AM 106/66 HR 78 ?  ?2. Are you having any other symptoms (ex. Dizziness, headache, blurred vision, passed out)? No  ? ?3. What is your BP issue? Was advised  by nurse to call in and report readings.  ?

## 2021-12-17 MED ORDER — METOPROLOL SUCCINATE ER 25 MG PO TB24
12.5000 mg | ORAL_TABLET | Freq: Every day | ORAL | Status: DC
Start: 2021-12-17 — End: 2022-02-06

## 2021-12-17 NOTE — Telephone Encounter (Signed)
Spoke with patient - these numbers are only on the Toprol '25mg'$  - 1/2 tab daily. ? ?Reminded patient of proper BP technique as far as sitting 5-10 minutes prior to checking - she then stated that none of her numbers were done this way (resting).   ? ?Today's readings - ? ?124/70  87 ?120/67  69 ? ?Last evening at 10:30 pm was 137/69  62 but had just came up the stairs getting back from [employer].   ?

## 2021-12-17 NOTE — Telephone Encounter (Signed)
Numbers look fine. Please clarify this is on the diltiazem and off the metoprolol. If so I would continue current regimen, if issues with high bp's call us and we could made adjustements. Goal blood pressure is <130/80 ? ?Zandra Abts MD ?

## 2021-12-18 NOTE — Telephone Encounter (Signed)
Does she prefer to stay on the toprol. We had talked that it was causing some fatigue and weight gain at last visit, tht was the reason we had tried the diltiazem ? ?Zandra Abts MD ?

## 2021-12-19 NOTE — Telephone Encounter (Signed)
Patient sates that she will stick with the Toprol for now.  BP this morning was 116/68  67.  States that she will discuss in greater detail with provider at her next [place] in June.   ?

## 2021-12-26 ENCOUNTER — Telehealth: Payer: Self-pay | Admitting: Adult Health

## 2021-12-26 NOTE — Telephone Encounter (Signed)
She fell off stool trying to kill a spider at work last week, hurt leg and hit right breast,still sore, has implants,wonders if the implant is leaking. Will talk to xray tomorrow and call her back ?

## 2021-12-26 NOTE — Telephone Encounter (Signed)
April Knox called this morning stating that she fell and hit her breast and now they are sore. She is wanting to know if she should be concerned and if you would call her.  ?

## 2021-12-27 ENCOUNTER — Telehealth: Payer: Self-pay | Admitting: Adult Health

## 2021-12-27 NOTE — Telephone Encounter (Signed)
Her implant is saline, make appt for breast exam  ?

## 2021-12-30 ENCOUNTER — Ambulatory Visit (INDEPENDENT_AMBULATORY_CARE_PROVIDER_SITE_OTHER): Payer: Medicare Other | Admitting: Adult Health

## 2021-12-30 ENCOUNTER — Encounter: Payer: Self-pay | Admitting: Adult Health

## 2021-12-30 VITALS — BP 165/83 | HR 90 | Ht 67.0 in | Wt 141.5 lb

## 2021-12-30 DIAGNOSIS — W19XXXA Unspecified fall, initial encounter: Secondary | ICD-10-CM | POA: Diagnosis not present

## 2021-12-30 DIAGNOSIS — Z0000 Encounter for general adult medical examination without abnormal findings: Secondary | ICD-10-CM | POA: Insufficient documentation

## 2021-12-30 DIAGNOSIS — Z9882 Breast implant status: Secondary | ICD-10-CM

## 2021-12-30 NOTE — Progress Notes (Signed)
?  Subjective:  ?  ? [name], female   DOB: [birth year], 66 y.o.   MRN: [id number] ? ?HPI ?April Knox is a 66 year old white female, widowed, PM in for breast exam, she feel off a stool at work, trying to kill a spider and hit ride side on cabinet about 10 days ago. She has saline breast implants and worried that she busted the right one. She is on phone call during exam.  ? ?PCP is Dr Sherrie Sport. ?Lab Results  ?Component Value Date  ? DIAGPAP - Low grade squamous intraepithelial lesion (LSIL) (A) 06/24/2021  ? HPV DETECTED (A) 04/01/2018  ? [place] Positive (A) 06/24/2021  ?  ? ?Review of Systems ?+tenderness right side breast ?Has gained some weight ?Reviewed past medical,surgical, social and family history. Reviewed medications and allergies.  ?   ?Objective:  ? Physical Exam ?BP (!) 165/83 (BP Location: Left Arm, Patient Position: Sitting, Cuff Size: Normal)   Pulse 90   Ht '5\' 7"'$  (1.702 m)   Wt 141 lb 8 oz (64.2 kg)   BMI 22.16 kg/m?   ?   Skin warm and dry,  Breasts:no dominate palpable mass, retraction or nipple discharge, implants feel full, has some tenderness on right, no bruising ?Fall risk is moderate ? [place] - 12/30/21 0952   ? ?  ? Pregnancy Intention Screening  ? Does the patient want to become pregnant in the next year? N/A   ? Does the patient's partner want to become pregnant in the next year? N/A   ? Would the patient like to discuss contraceptive options today? N/A   ?  ? Contraception Wrap Up  ? Current Method No Method - Other Reason   postmenopausal  ? End Method No Method - Other Reason   postmenopausal  ? Contraception Counseling Provided No   ? ?  ?  ? ?  ?  ?Assessment:  ?   ?1. Fall, initial encounter ?Has tenderness but no bruising right outer breast area ? ? ?2. Normal breast exam ? ? ?3. H/O bilateral breast implants ?Implants appear full and symmetric ? ?   ?Plan:  ?   ?Follow up prn  ?   ?

## 2022-01-06 ENCOUNTER — Other Ambulatory Visit: Payer: Self-pay | Admitting: Adult Health

## 2022-01-31 ENCOUNTER — Telehealth: Payer: Self-pay | Admitting: *Deleted

## 2022-01-31 NOTE — Telephone Encounter (Signed)
error 

## 2022-02-03 ENCOUNTER — Other Ambulatory Visit: Payer: Self-pay | Admitting: Adult Health

## 2022-02-06 ENCOUNTER — Encounter: Payer: Self-pay | Admitting: Cardiology

## 2022-02-06 ENCOUNTER — Other Ambulatory Visit: Payer: Self-pay | Admitting: Cardiology

## 2022-02-06 ENCOUNTER — Ambulatory Visit (INDEPENDENT_AMBULATORY_CARE_PROVIDER_SITE_OTHER): Payer: Medicare Other | Admitting: Cardiology

## 2022-02-06 ENCOUNTER — Ambulatory Visit (INDEPENDENT_AMBULATORY_CARE_PROVIDER_SITE_OTHER): Payer: Medicare Other

## 2022-02-06 VITALS — BP 150/82 | HR 76 | Ht 67.0 in | Wt 140.6 lb

## 2022-02-06 DIAGNOSIS — R002 Palpitations: Secondary | ICD-10-CM

## 2022-02-06 DIAGNOSIS — I10 Essential (primary) hypertension: Secondary | ICD-10-CM | POA: Diagnosis not present

## 2022-02-06 DIAGNOSIS — I471 Supraventricular tachycardia: Secondary | ICD-10-CM

## 2022-02-06 MED ORDER — METOPROLOL SUCCINATE ER 25 MG PO TB24: 12.5000 mg | ORAL_TABLET | Freq: Every day | ORAL | Status: DC

## 2022-02-06 NOTE — Progress Notes (Signed)
Clinical Summary Ms. Tinch is a 66 y.o.female seen today for follow up of the following medical problems.    1. PSVT - history is unclear from available notes. Notes mention PSVT documented on event recorder however do not see monitor report in our system EKGs reviewed, show normal sinus rhythm          - reported weight gain on toprol '25mg'$ , lowered to 12.'5mg'$  daily. Reports some nonspecific fatigue at times. We tried changing to diltiazem but high bp's and did not feel well on, back on toprol - reports infrequent palpitations.        2. HTN - compliant with meds - home bp's 120s/60s-70s - recently started on norvasc '5mg'$  daily by pcp - often stress/anxiety at doctors office, bp's tend to run higher here then at home.    3. Hyperlipidmeia - compliant with meds - 06/2021 TC 170 TG 113 HDL 82 LDL 86     SH: works at [employer] Past Medical History:  Diagnosis Date   Abnormal Pap smear    Abnormal Papanicolaou smear of cervix with positive human papilloma virus (HPV) test 04/06/2018   Pap was LSIL with +HPV, will need colpo____   Anxiety    Back pain 03/07/2014   Cervical spine disease    Mild   Chest pain    Elevated cholesterol    Ganglion cyst of left foot 12/06/2013   H/O bilateral breast implants 03/03/2013   Hematuria 03/28/2014   Hot flashes 12/06/2013   Hypertension    Menopausal symptoms 12/06/2013   Mental disorder    anxiety   Migraine with visual aura    Osteopenia after menopause 10/06/2018   DEXA 09/28/2018, osteopenia, take calcium vitamin D and stay active    PSVT (paroxysmal supraventricular tachycardia) ([place])    Documented by event recorder   Rectocele 03/03/2013   RUQ pain 03/07/2014   Vaginal discharge 02/24/2014   Vaginal dryness, menopausal 12/06/2013   Vaginal Pap smear, abnormal    Yeast infection 02/24/2014     Allergies  Allergen Reactions   Fish Allergy Anaphylaxis and Swelling    Throat closes.   Bactrim Ds [Sulfamethoxazole-Trimethoprim]     Hives     Ciprocinonide [Fluocinolone] Hives    " made me feel awful"    Codeine Other (See Comments)    DROPS PT. BLOOD PRESSURE   Penicillins     Has patient had a PCN reaction causing immediate rash, facial/tongue/throat swelling, SOB or lightheadedness with hypotension:unsure Has patient had a PCN reaction causing severe rash involving mucus membranes or skin necrosis:unsure Has patient had a PCN reaction that required hospitalization:No Has patient had a PCN reaction occurring within the last 10 years:No If all of the above answers are "NO", then may proceed with Cephalosporin use. Childhood reaction    Sulfa Antibiotics Hives, Diarrhea, Nausea Only and Rash     Current Outpatient Medications  Medication Sig Dispense Refill   acetaminophen (TYLENOL) 500 MG tablet Take 500 mg by mouth every 6 (six) hours as needed (for pain).      ALPRAZolam (XANAX) 1 MG tablet TAKE ONE TABLET BY MOUTH TWICE DAILY AS NEEDED FOR ANXIETY 40 tablet 0   estradiol (ESTRACE) 0.1 MG/GM vaginal cream INSERT ONE APPLICATORFUL VAGINALLY THREE TIMES A WEEK 42.5 g 3   hydrochlorothiazide (HYDRODIURIL) 25 MG tablet Take 25 mg by mouth daily.     levocetirizine (XYZAL) 5 MG tablet Take 5 mg by mouth daily. In the morning per  the patient.     metoprolol succinate (TOPROL XL) 25 MG 24 hr tablet Take 0.5 tablets (12.5 mg total) by mouth daily.     metroNIDAZOLE (FLAGYL) 500 MG tablet Take 1 tablet (500 mg total) by mouth 2 (two) times daily. 14 tablet 0   omeprazole (PRILOSEC) 20 MG capsule TAKE ONE CAPSULE BY MOUTH ONCE DAILY 90 capsule 0   potassium chloride (KLOR-CON) 10 MEQ tablet Take 10 mEq by mouth daily.     Rimegepant Sulfate (NURTEC) 75 MG TBDP Take 75 mg by mouth daily as needed. Dispense 10 or max allowed by insurance. Triptans contraindicated due to retinal migraines 10 tablet 6   rosuvastatin (CRESTOR) 10 MG tablet TAKE ONE TABLET BY MOUTH ONCE DAILY 90 tablet 3   No current facility-administered medications  for this visit.     Past Surgical History:  Procedure Laterality Date   BIOPSY N/A 12/16/2019   Procedure: BIOPSY;  Surgeon: Rogene Houston, MD;  [facility];  Service: Endoscopy;  Laterality: N/A;   BREAST ENHANCEMENT SURGERY     COLONOSCOPY N/A 06/25/2016   Procedure: COLONOSCOPY;  Surgeon: Rogene Houston, MD;  Location: [facility];  Service: Endoscopy;  Laterality: N/A;  1:00-moved to 1230 Ann to notify pt   ESOPHAGOGASTRODUODENOSCOPY (EGD) WITH PROPOFOL N/A 12/16/2019   Procedure: ESOPHAGOGASTRODUODENOSCOPY (EGD) WITH PROPOFOL;  Surgeon: Rogene Houston, MD;  [facility];  Service: Endoscopy;  Laterality: N/A;  830   POLYPECTOMY  06/25/2016   Procedure: POLYPECTOMY;  Surgeon: Rogene Houston, MD;  [facility];  Service: Endoscopy;;  colon    toe biopsy       Allergies  Allergen Reactions   Fish Allergy Anaphylaxis and Swelling    Throat closes.   Bactrim Ds [Sulfamethoxazole-Trimethoprim]     Hives    Ciprocinonide [Fluocinolone] Hives    " made me feel awful"    Codeine Other (See Comments)    DROPS PT. BLOOD PRESSURE   Penicillins     Has patient had a PCN reaction causing immediate rash, facial/tongue/throat swelling, SOB or lightheadedness with hypotension:unsure Has patient had a PCN reaction causing severe rash involving mucus membranes or skin necrosis:unsure Has patient had a PCN reaction that required hospitalization:No Has patient had a PCN reaction occurring within the last 10 years:No If all of the above answers are "NO", then may proceed with Cephalosporin use. Childhood reaction    Sulfa Antibiotics Hives, Diarrhea, Nausea Only and Rash      Family History  Problem Relation Age of Onset   Dementia Father    Cancer Mother    Migraines Mother    Healthy Daughter    Healthy Daughter    Healthy Daughter    Cancer Maternal Grandmother    Heart disease Maternal Grandmother    Diabetes Maternal Grandfather     Heart attack Paternal Grandmother      Social History Ms. Zellmer reports that she has never smoked. She has never used smokeless tobacco. Ms. Wisby reports no history of alcohol use.   Review of Systems CONSTITUTIONAL: No weight loss, fever, chills, weakness or fatigue.  HEENT: Eyes: No visual loss, blurred vision, double vision or yellow sclerae.No hearing loss, sneezing, congestion, runny nose or sore throat.  SKIN: No rash or itching.  CARDIOVASCULAR: per hpi RESPIRATORY: No shortness of breath, cough or sputum.  GASTROINTESTINAL: No anorexia, nausea, vomiting or diarrhea. No abdominal pain or blood.  GENITOURINARY: No burning on urination, no  polyuria NEUROLOGICAL: No headache, dizziness, syncope, paralysis, ataxia, numbness or tingling in the extremities. No change in bowel or bladder control.  MUSCULOSKELETAL: No muscle, back pain, joint pain or stiffness.  LYMPHATICS: No enlarged nodes. No history of splenectomy.  PSYCHIATRIC: No history of depression or anxiety.  ENDOCRINOLOGIC: No reports of sweating, cold or heat intolerance. No polyuria or polydipsia.  Marland Kitchen   Physical Examination Today's Vitals   02/06/22 1102  BP: (!) 150/82  Pulse: 76  SpO2: 98%  Weight: 140 lb 9.6 oz (63.8 kg)  Height: '5\' 7"'$  (1.702 m)   Body mass index is 22.02 kg/m.  Gen: resting comfortably, no acute distress HEENT: no scleral icterus, pupils equal round and reactive, no palptable cervical adenopathy,  CV: RRR, no m/r/g no jvd Resp: Clear to auscultation bilaterally GI: abdomen is soft, non-tender, non-distended, normal bowel sounds, no hepatosplenomegaly MSK: extremities are warm, no edema.  Skin: warm, no rash Neuro:  no focal deficits Psych: appropriate affect   Diagnostic Studies 10/2020 monitor 24 hr monitor Patch Wear Time:  1 days and 1 hours (2022-04-20T15:06:18-0400 to 2022-04-21T16:51:52-0400)   Patient had a min HR of 50 bpm, max HR of 126 bpm, and avg HR of 65 bpm.  Predominant underlying rhythm was Sinus Rhythm. Isolated SVEs were rare (<1.0%), and no SVE Couplets or SVE Triplets were present. Isolated VEs were rare (<1.0%), and no VE Couplets  or VE Triplets were present.     Assessment and Plan   1. PSVT - long history of palpitations  - reported some weight gain and fatigue on toprol '25mg'$ , lowered to 12.'5mg'$  daily. - we tried dilt but did not feel well on, back on toprol though ongoing fatigue and weight gain. Hold toprol for now as palpitations have not been active recently, repeat 14 day monitor if we can capture an arrhythmia may be other treatment options other than just av nodal agents EKG today shows NSR   2. HTN -elevated here but home numbers at goal. Significant stress about her bp's, we discussed that bp will be elevated when stressed or anxious and that's ok, we titrate meds based on resting bp's which at home for her are at goal. Continue norvasc, update [place] on bp's in 1 week.         Arnoldo Lenis, M.D.

## 2022-02-06 NOTE — Patient Instructions (Addendum)
Medication Instructions:  Your physician has recommended you make the following change in your medication:  Hold metoprolol-call office in 1 week with your home blood pressure readings Continue other medications the same  Labwork: none  Testing/Procedures: Your physician has recommended that you wear a Zio monitor.   This monitor is a medical device that records the heart's electrical activity. Doctors most often use these monitors to diagnose arrhythmias. Arrhythmias are problems with the speed or rhythm of the heartbeat. The monitor is a small device applied to your chest. You can wear one while you do your normal daily activities. While wearing this monitor if you have any symptoms to push the button and record what you felt. Once you have worn this monitor for the period of time provider prescribed (for 14 days), you will return the monitor device in the postage paid box. Once it is returned they will download the data collected and provide [place] with a report which the provider will then review and we will call you with those results. Important tips:  Avoid showering during the first 24 hours of wearing the monitor. Avoid excessive sweating to help maximize wear time. Do not submerge the device, no hot tubs, and no swimming pools. Keep any lotions or oils away from the patch. After 24 hours you may shower with the patch on. Take brief showers with your back facing the shower head.  Do not remove patch once it has been placed because that will interrupt data and decrease adhesive wear time. Push the button when you have any symptoms and write down what you were feeling. Once you have completed wearing your monitor, remove and place into box which has postage paid and place in your outgoing mailbox.  If for some reason you have misplaced your box then call our office and we can provide another box and/or mail it off for you.  Follow-Up: Your physician recommends that you schedule a follow-up  appointment in: 6 months   Any Other Special Instructions Will Be Listed Below (If Applicable).  If you need a refill on your cardiac medications before your next appointment, please call your pharmacy.

## 2022-02-10 ENCOUNTER — Ambulatory Visit: Payer: Medicare Other | Admitting: Cardiology

## 2022-02-10 ENCOUNTER — Telehealth: Payer: Self-pay | Admitting: Cardiology

## 2022-02-10 NOTE — Telephone Encounter (Signed)
Patient called stating she has an event monitor on and it's been blinking orange today.  She wants to know if she should take it off and send it back to the company.  She has had it since last week Thursday.

## 2022-02-10 NOTE — Telephone Encounter (Signed)
Patient calling in with bp reading  6/9: 10:15am 116/66 hr 69 6/10: 6:15am  116/60 hr 69           5:15am  124/57 hr 71 6/11: 915am   119/69 hr 80 6/12: 8:45am   96/61 hr 67

## 2022-02-10 NOTE — Telephone Encounter (Signed)
No answer

## 2022-02-11 NOTE — Telephone Encounter (Signed)
Explained to patient that blinking orange means that it is not making good contact with skin.  Does state that she has tried rubbing on it to get to stick - stops blinking for a few minutes then starts back again.  Stated that she did call the company & was told to put in the box & send back to them.  Suggested that she call them back to see if they were sending her a replacement as she was supposed to be wearing x 14 days (till 02/20/22).  She verbalized understanding.

## 2022-02-11 NOTE — Telephone Encounter (Signed)
Patient called again. She said it's still blinking orange. She can be reached at [phone] or cell [phone]

## 2022-02-12 NOTE — Telephone Encounter (Signed)
BP's are fine, no changes at this time   Zandra Abts MD

## 2022-02-13 NOTE — Telephone Encounter (Signed)
Patient informed. Copy sent to PCP °

## 2022-02-18 ENCOUNTER — Telehealth: Payer: Self-pay | Admitting: Cardiology

## 2022-02-18 NOTE — Telephone Encounter (Signed)
Patient states she is returning call. And wanted to add that her heart has been racing. Please advise

## 2022-02-18 NOTE — Telephone Encounter (Signed)
Patient states she was returning a call from a few days, but do not see anything documented that message was left for a return call.  Informed her that her monitor was just uploaded to Dr. Harl Bowie - will let her know when he has put final disposition on her result.

## 2022-02-20 ENCOUNTER — Telehealth: Payer: Self-pay | Admitting: Cardiology

## 2022-02-20 NOTE — Telephone Encounter (Signed)
Pt c/o BP issue: STAT if pt c/o blurred vision, one-sided weakness or slurred speech  1. What are your last 5 BP readings?   6/13  8am  124/74  HR 75 6/14  8am  133/65  HR 64 6/15  6am   116/30  HR 65 6/16  6am  116/62  HR 64 6/17 not taken 6/18  9am  112/58  HR71 6/19 not taken 6/20  7am  97/52  HR 68 6/21  10am  115/56  HR 73 6/22  not taken   2. Are you having any other symptoms (ex. Dizziness, headache, blurred vision, passed out)? No  3. What is your BP issue?   Patient called to provide her BP reading to Dr. Harl Bowie

## 2022-02-20 NOTE — Telephone Encounter (Signed)
Will forward to Dr. Branch. 

## 2022-02-25 NOTE — Telephone Encounter (Signed)
Pt notified that numbers are fine

## 2022-03-01 ENCOUNTER — Other Ambulatory Visit: Payer: Self-pay | Admitting: Adult Health

## 2022-03-14 ENCOUNTER — Telehealth: Payer: Self-pay | Admitting: *Deleted

## 2022-03-14 MED ORDER — ATENOLOL 25 MG PO TABS: 12.5000 mg | ORAL_TABLET | Freq: Every day | ORAL | 6 refills | Status: DC

## 2022-03-14 NOTE — Telephone Encounter (Signed)
Laurine Blazer, LPN  5/43/6067  7:03 PM EDT Back to Top    Notified, copy to pcp.  New prescription sent to [facility] now.

## 2022-03-14 NOTE — Telephone Encounter (Signed)
-----   Message from Bernita Raisin, RN sent at 03/10/2022 10:38 AM EDT -----  ----- Message ----- From: Arnoldo Lenis, MD Sent: 03/09/2022  11:24 AM EDT To: Berlinda Last, CMA  Heart monitor just shows some occasional extra heart beats which are considered benign but can cause symptoms of heart fluttering or skipping. If symptoms tolerable no change, if ongoing symptoms we could try atenolol to see if better tolerated than toprol, they are in same family but sometimes atenolol is better tolerated. Would start 12.'5mg'$  daily of atenolol if she wishes to try if symptoms are significant  Zandra Abts MD

## 2022-03-31 ENCOUNTER — Other Ambulatory Visit: Payer: Self-pay | Admitting: Adult Health

## 2022-04-29 ENCOUNTER — Telehealth: Payer: Self-pay | Admitting: Adult Health

## 2022-04-29 MED ORDER — METRONIDAZOLE 500 MG PO TABS: 500.0000 mg | ORAL_TABLET | Freq: Two times a day (BID) | ORAL | 0 refills | Status: DC

## 2022-04-29 NOTE — Telephone Encounter (Signed)
Pt aware Flagyl was sent in and if not better after med, needs to be seen. Pt voiced understanding. [place]

## 2022-04-29 NOTE — Telephone Encounter (Signed)
Rx sent in for flagyl, if not better then make appt

## 2022-04-29 NOTE — Addendum Note (Signed)
Addended by: Derrek Monaco A on: 04/29/2022 01:09 PM   Modules accepted: Orders

## 2022-04-29 NOTE — Telephone Encounter (Signed)
Patient calling stating that she has changed soap and now she has a pimple come up and a small odor and wants to know if you would send something in like flagyl or doxycycline to [facility] in [place]

## 2022-05-07 ENCOUNTER — Other Ambulatory Visit: Payer: Self-pay | Admitting: Adult Health

## 2022-06-02 ENCOUNTER — Other Ambulatory Visit: Payer: Self-pay | Admitting: Adult Health

## 2022-06-16 ENCOUNTER — Other Ambulatory Visit: Payer: Medicare Other

## 2022-06-18 ENCOUNTER — Telehealth: Payer: Self-pay | Admitting: Adult Health

## 2022-06-18 NOTE — Telephone Encounter (Signed)
Pt is asking for a call about a uti?

## 2022-06-18 NOTE — Telephone Encounter (Signed)
Left message @ 12:27 pm. JSY

## 2022-06-20 NOTE — Telephone Encounter (Signed)
Pt had a +urine culture showing e.coli through [facility]. Pt is still taking antibiotic. Pt's partner has been wanting to have anal sex. He "slipped" and then went back to vagina. She started "not feeling right" 1 week after. In her mind, that's where it came from. Pt wants to talk to you. Thanks! [place]

## 2022-06-20 NOTE — Telephone Encounter (Signed)
Last week seen at  [facility] and had E coli, in urine on antibiotics. Push fluids will get urine culture at visit 06/30/22. She said partner slipped, and went from rectum to vagina once.

## 2022-06-24 ENCOUNTER — Ambulatory Visit: Payer: Medicare Other | Admitting: Adult Health

## 2022-06-27 ENCOUNTER — Other Ambulatory Visit (HOSPITAL_COMMUNITY)
Admission: RE | Admit: 2022-06-27 | Discharge: 2022-06-27 | Disposition: A | Discharge status: Home / Self Care | Payer: Medicare Other | Source: Ambulatory Visit | Attending: Adult Health | Admitting: Adult Health

## 2022-06-27 ENCOUNTER — Other Ambulatory Visit: Payer: Self-pay | Admitting: Adult Health

## 2022-06-27 ENCOUNTER — Ambulatory Visit (INDEPENDENT_AMBULATORY_CARE_PROVIDER_SITE_OTHER): Payer: Medicare Other | Admitting: Adult Health

## 2022-06-27 ENCOUNTER — Encounter: Payer: Self-pay | Admitting: Adult Health

## 2022-06-27 VITALS — BP 142/77 | HR 63 | Ht 67.0 in | Wt 142.0 lb

## 2022-06-27 DIAGNOSIS — N870 Mild cervical dysplasia: Secondary | ICD-10-CM | POA: Insufficient documentation

## 2022-06-27 DIAGNOSIS — Z01419 Encounter for gynecological examination (general) (routine) without abnormal findings: Secondary | ICD-10-CM

## 2022-06-27 DIAGNOSIS — N390 Urinary tract infection, site not specified: Secondary | ICD-10-CM

## 2022-06-27 DIAGNOSIS — Z9882 Breast implant status: Secondary | ICD-10-CM

## 2022-06-27 DIAGNOSIS — Z1151 Encounter for screening for human papillomavirus (HPV): Secondary | ICD-10-CM | POA: Diagnosis not present

## 2022-06-27 DIAGNOSIS — B9620 Unspecified Escherichia coli [E. coli] as the cause of diseases classified elsewhere: Secondary | ICD-10-CM

## 2022-06-27 DIAGNOSIS — Z1211 Encounter for screening for malignant neoplasm of colon: Secondary | ICD-10-CM

## 2022-06-27 DIAGNOSIS — R87612 Low grade squamous intraepithelial lesion on cytologic smear of cervix (LGSIL): Secondary | ICD-10-CM | POA: Insufficient documentation

## 2022-06-27 DIAGNOSIS — Z01411 Encounter for gynecological examination (general) (routine) with abnormal findings: Secondary | ICD-10-CM | POA: Insufficient documentation

## 2022-06-27 LAB — HEMOCCULT GUIAC POC 1CARD (OFFICE): Fecal Occult Blood, POC: NEGATIVE

## 2022-06-27 NOTE — Progress Notes (Signed)
[name], female   DOB: 10-26-1955, 66 y.o.   MRN: [id number] History of Present Illness: April Knox is a 66 year old white female, widowed, PM in for a well woman gyn exam and pap. Pap last year, on 06/24/21, was LSIL, +HPV other and 16, she had colpo 11/3/222, was negative. Had recent UTI wasn't PCO culture.  PCP is Dr Sherrie Sport.  Current Medications, Allergies, Past Medical History, Past Surgical History, Family History and Social History were reviewed in [employer] record.     Review of Systems: Patient denies any headaches, hearing loss, fatigue, blurred vision, shortness of breath, chest pain, abdominal pain, problems with bowel movements, urination, or intercourse. No joint pain or mood swings. Has some vaginal dryness, forgets to use PVC Has gained some weight, she says 10 lbs    Physical Exam:BP (!) 142/77 (BP Location: Left Arm, Patient Position: Sitting, Cuff Size: Normal)   Pulse 63   Ht '5\' 7"'$  (1.702 m)   Wt 142 lb (64.4 kg)   BMI 22.24 kg/m   General:  Well developed, well nourished, no acute distress Skin:  Warm and dry Neck:  Midline trachea, normal thyroid, good ROM, no lymphadenopathy, no carotid bruits heard Lungs; Clear to auscultation bilaterally Breast:  No dominant palpable mass, retraction, or nipple discharge,has bilateral implnats Cardiovascular: Regular rate and rhythm Abdomen:  Soft, non tender, no hepatosplenomegaly Pelvic:  External genitalia is normal in appearance, no lesions.  The vagina is pale with loss of rugae. Urethra has no lesions or masses. The cervix is smooth, pap with GC/CHL and HR HPV genotyping performed.  Uterus is felt to be normal size, shape, and contour.  No adnexal masses or tenderness noted.Bladder is non tender, no masses felt. Rectal: Good sphincter tone, no polyps, or hemorrhoids felt.  Hemoccult negative. Extremities/musculoskeletal:  No swelling or varicosities noted, no clubbing or  cyanosis Psych:  No mood changes, alert and cooperative,seems happy AA is 0 Fall risk is low    06/27/2022    9:29 AM 06/24/2021   11:39 AM 05/09/2020   11:03 AM  Depression screen PHQ 2/9  Decreased Interest 0 0 2  Down, Depressed, Hopeless 0 0 1  PHQ - 2 Score 0 0 3  Altered sleeping 0  1  Tired, decreased energy 0  1  Change in appetite 0  1  Feeling bad or failure about yourself  0  2  Trouble concentrating 0  0  Moving slowly or fidgety/restless 0  0  Suicidal thoughts 0  0  PHQ-9 Score 0  8  Difficult doing work/chores   Not difficult at all       06/27/2022    9:29 AM 06/24/2021   12:20 PM 05/09/2020   11:03 AM  GAD 7 : Generalized Anxiety Score  Nervous, Anxious, on Edge 0 1 0  Control/stop worrying 0 1 0  Worry too much - different things 0 1 0  Trouble relaxing 0 1 0  Restless 0 0 0  Easily annoyed or irritable 0 0 0  Afraid - awful might happen 0 0 0  Total GAD 7 Score 0 4 0  Anxiety Difficulty  Not difficult at all Not difficult at all    [place] - 06/27/22 0926       Pregnancy Intention Screening   Does the patient want to become pregnant in the next year? N/A    Does the patient's partner want to become pregnant in the next year?  N/A    Would the patient like to discuss contraceptive options today? N/A      Contraception Wrap Up   Current Method No Method - Other Reason   postmenopausal   End Method No Method - Other Reason   postmenopausal   Contraception Counseling Provided No              Examination chaperoned by Levy Pupa LPN   Impression and Plan: 1. E. coli urinary tract infection Culture sent for POC recent UTI  2. Encounter for gynecological examination with Papanicolaou smear of cervix Pap sent Physical in 1 year Mammogram was negative 10/16/21 Colonoscopy due 2027 Labs with cardiologist Stay active  3. H/O bilateral breast implants  4. Encounter for screening fecal occult blood testing Hemoccult negative   5.  Dysplasia of cervix, low grade (CIN 1), +HPV 16 Pap sent

## 2022-06-30 ENCOUNTER — Ambulatory Visit: Payer: Medicare Other | Admitting: Adult Health

## 2022-06-30 ENCOUNTER — Telehealth: Payer: Self-pay

## 2022-06-30 LAB — URINE CULTURE: Organism ID, Bacteria: NO GROWTH

## 2022-06-30 NOTE — Telephone Encounter (Signed)
Pt aware results are not back yet. Pt voiced understanding. [place]

## 2022-06-30 NOTE — Telephone Encounter (Signed)
Patient would like to know the results of her test from Friday

## 2022-07-02 LAB — CYTOLOGY - PAP
Comment: NEGATIVE
Comment: NEGATIVE
Comment: NEGATIVE
HPV 16: POSITIVE — AB
HPV 18 / 45: NEGATIVE
High risk HPV: POSITIVE — AB

## 2022-07-03 ENCOUNTER — Telehealth: Payer: Self-pay | Admitting: Adult Health

## 2022-07-03 NOTE — Telephone Encounter (Signed)
Pt aware of pap will make colpo appt

## 2022-07-03 NOTE — Telephone Encounter (Signed)
Left message to call me back, she needs colpo for abnormal pap

## 2022-07-03 NOTE — Telephone Encounter (Signed)
Pt is requesting a call about her results.

## 2022-07-08 ENCOUNTER — Other Ambulatory Visit: Payer: Self-pay | Admitting: Cardiology

## 2022-07-08 ENCOUNTER — Encounter: Payer: Medicare Other | Admitting: Obstetrics & Gynecology

## 2022-07-10 ENCOUNTER — Ambulatory Visit (INDEPENDENT_AMBULATORY_CARE_PROVIDER_SITE_OTHER): Payer: Medicare Other | Admitting: Obstetrics & Gynecology

## 2022-07-10 ENCOUNTER — Encounter: Payer: Self-pay | Admitting: Obstetrics & Gynecology

## 2022-07-10 ENCOUNTER — Encounter: Payer: Medicare Other | Admitting: Obstetrics & Gynecology

## 2022-07-10 VITALS — BP 124/78 | HR 72 | Ht 67.0 in | Wt 141.0 lb

## 2022-07-10 DIAGNOSIS — N870 Mild cervical dysplasia: Secondary | ICD-10-CM | POA: Diagnosis not present

## 2022-07-10 MED ORDER — TERCONAZOLE 0.4 % VA CREA: 1.0000 | TOPICAL_CREAM | Freq: Every day | VAGINAL | 0 refills | Status: DC

## 2022-07-10 NOTE — Progress Notes (Signed)
    Colposcopy Procedure Note:    Colposcopy Procedure Note  Indications:  LSIL neg [birth year]/45   2019 ASCCP recommendation:  Smoker:  No. New sexual partner:  No.  : time frame:  No.  History of abnormal Pap: yes  Procedure Details  The risks and benefits of the procedure and Written informed consent obtained.  Speculum placed in vagina and excellent visualization of cervix achieved, cervix swabbed x 3 with acetic acid solution.  Findings: Adequate colposcopy is noted today.  Cervix: no visible lesions, no mosaicism, no punctation, and no abnormal vasculature; SCJ visualized 360 degrees without lesions and no biopsies taken. Vaginal inspection: normal without visible lesions. Vulvar colposcopy: vulvar colposcopy not performed. +yeast(was on antibiotics) Specimens: none  Complications: none.  Colposcopic Impression: Adequate colposcopy  Plan(Based on 2019 ASCCP recommendations) Repeat HPV based cytology

## 2022-07-17 ENCOUNTER — Encounter: Payer: Medicare Other | Admitting: Obstetrics & Gynecology

## 2022-07-17 ENCOUNTER — Telehealth: Payer: Self-pay | Admitting: Adult Health

## 2022-07-17 NOTE — Telephone Encounter (Signed)
Patient states that she just had lab work completed at Dr. Joselyn Arrow office and they told her that her triglycerides are 184. She said 6 months ago it was 74. She does not believe the reading is correct, requested a redraw and they told her that it can't be reordered. Patient wants to know if [name] will reorder this lab so she can have it drawn again. Please advise.

## 2022-07-17 NOTE — Telephone Encounter (Signed)
Pt has a question about her lab work from her primary Dr. Please contact.

## 2022-07-18 NOTE — Telephone Encounter (Signed)
Notified patient that insurance will not pay this soon. Can recheck in 2-3 months. Patient agreed and verbalized understanding.

## 2022-08-02 ENCOUNTER — Other Ambulatory Visit: Payer: Self-pay | Admitting: Adult Health

## 2022-08-14 ENCOUNTER — Other Ambulatory Visit: Payer: Self-pay | Admitting: Adult Health

## 2022-08-14 ENCOUNTER — Other Ambulatory Visit: Payer: Self-pay | Admitting: Obstetrics & Gynecology

## 2022-08-15 NOTE — Telephone Encounter (Signed)
Patient would like for a nurse to call her

## 2022-08-19 ENCOUNTER — Ambulatory Visit: Payer: Medicare Other | Attending: Cardiology | Admitting: Cardiology

## 2022-08-19 ENCOUNTER — Encounter: Payer: Self-pay | Admitting: Cardiology

## 2022-08-19 VITALS — BP 140/75 | HR 71 | Ht 67.0 in | Wt 141.6 lb

## 2022-08-19 DIAGNOSIS — E782 Mixed hyperlipidemia: Secondary | ICD-10-CM

## 2022-08-19 DIAGNOSIS — I10 Essential (primary) hypertension: Secondary | ICD-10-CM

## 2022-08-19 DIAGNOSIS — I471 Supraventricular tachycardia, unspecified: Secondary | ICD-10-CM | POA: Diagnosis not present

## 2022-08-19 DIAGNOSIS — R002 Palpitations: Secondary | ICD-10-CM | POA: Diagnosis not present

## 2022-08-19 NOTE — Progress Notes (Signed)
Clinical Summary April Knox is a 66 y.o.female seen today for follow up of the following medical problems.    1. PSVT - history is unclear from available notes. Notes mention PSVT documented on event recorder however do not see monitor report in our system EKGs reviewed, show normal sinus rhythm          - reported weight gain on toprol '25mg'$ , lowered to 12.'5mg'$  daily. Reports some nonspecific fatigue at times. We tried changing to diltiazem but high bp's and did not feel well on, back on toprol - reports infrequent palpitations.     01/2022 monitor: rare ectopy - started atenolol 12.'5mg'$  daily, no recurrent symptoms and tolerating without side effects.    2. HTNpcp - often stress/anxiety at doctors office, bp's tend to run higher here then at home. Last visit her f/u home bp's had been at goal despite being elevated in clinic - compliant with meds    3. Hyperlipidmeia - compliant with meds - 06/2021 TC 170 TG 113 HDL 82 LDL 86 -07/2022 TC 169 TG 184 HDL 55 LDL 83. Reports had not been fasting, likely why TGs elevated     SH: recently stopped working at [employer], in between jobs Past Medical History:  Diagnosis Date   Abnormal Pap smear    Abnormal Papanicolaou smear of cervix with positive human papilloma virus (HPV) test 04/06/2018   Pap was LSIL with +HPV, will need colpo____   Anxiety    Back pain 03/07/2014   Cervical spine disease    Mild   Chest pain    Elevated cholesterol    Ganglion cyst of left foot 12/06/2013   H/O bilateral breast implants 03/03/2013   Hematuria 03/28/2014   Hot flashes 12/06/2013   Hypertension    Menopausal symptoms 12/06/2013   Mental disorder    anxiety   Migraine with visual aura    Osteopenia after menopause 10/06/2018   DEXA 09/28/2018, osteopenia, take calcium vitamin D and stay active    PSVT (paroxysmal supraventricular tachycardia)    Documented by event recorder   Rectocele 03/03/2013   RUQ pain 03/07/2014   Vaginal discharge 02/24/2014    Vaginal dryness, menopausal 12/06/2013   Vaginal Pap smear, abnormal    Yeast infection 02/24/2014     Allergies  Allergen Reactions   Fish Allergy Anaphylaxis and Swelling    Throat closes.   Bactrim Ds [Sulfamethoxazole-Trimethoprim]     Hives    Ciprocinonide [Fluocinolone] Hives    " made me feel awful"    Codeine Other (See Comments)    DROPS PT. BLOOD PRESSURE   Penicillins     Has patient had a PCN reaction causing immediate rash, facial/tongue/throat swelling, SOB or lightheadedness with hypotension:unsure Has patient had a PCN reaction causing severe rash involving mucus membranes or skin necrosis:unsure Has patient had a PCN reaction that required hospitalization:No Has patient had a PCN reaction occurring within the last 10 years:No If all of the above answers are "NO", then may proceed with Cephalosporin use. Childhood reaction    Sulfa Antibiotics Hives, Diarrhea, Nausea Only and Rash     Current Outpatient Medications  Medication Sig Dispense Refill   acetaminophen (TYLENOL) 500 MG tablet Take 500 mg by mouth every 6 (six) hours as needed (for pain).      ALPRAZolam (XANAX) 1 MG tablet TAKE ONE TABLET BY MOUTH TWICE DAILY AS NEEDED FOR ANXIETY 40 tablet 0   amLODipine (NORVASC) 5 MG tablet Take 5  mg by mouth daily.     atenolol (TENORMIN) 25 MG tablet TAKE 1/2 TABLET BY MOUTH ONCE DAILY 45 tablet 3   estradiol (ESTRACE) 0.1 MG/GM vaginal cream INSERT ONE APPLICATORFUL VAGINALLY THREE TIMES A WEEK 42.5 g 3   levocetirizine (XYZAL) 5 MG tablet Take 5 mg by mouth daily. In the morning per the patient.     omeprazole (PRILOSEC) 20 MG capsule TAKE ONE CAPSULE BY MOUTH ONCE DAILY 90 capsule 0   rosuvastatin (CRESTOR) 10 MG tablet TAKE ONE TABLET BY MOUTH ONCE DAILY 90 tablet 3   fluticasone (FLONASE) 50 MCG/ACT nasal spray Place 1 spray into both nostrils daily. (Patient not taking: Reported on 08/19/2022)     Rimegepant Sulfate (NURTEC) 75 MG TBDP Take 75 mg by mouth  daily as needed. Dispense 10 or max allowed by insurance. Triptans contraindicated due to retinal migraines (Patient not taking: Reported on 08/19/2022) 10 tablet 6   terconazole (TERAZOL 7) 0.4 % vaginal cream Place 1 applicator vaginally at bedtime. (Patient not taking: Reported on 08/19/2022) 45 g 3   No current facility-administered medications for this visit.     Past Surgical History:  Procedure Laterality Date   BIOPSY N/A 12/16/2019   Procedure: BIOPSY;  Surgeon: Rogene Houston, MD;  [facility];  Service: Endoscopy;  Laterality: N/A;   BREAST ENHANCEMENT SURGERY     COLONOSCOPY N/A 06/25/2016   Procedure: COLONOSCOPY;  Surgeon: Rogene Houston, MD;  [facility];  Service: Endoscopy;  Laterality: N/A;  1:00-moved to 1230 Ann to notify pt   ESOPHAGOGASTRODUODENOSCOPY (EGD) WITH PROPOFOL N/A 12/16/2019   Procedure: ESOPHAGOGASTRODUODENOSCOPY (EGD) WITH PROPOFOL;  Surgeon: Rogene Houston, MD;  [facility];  Service: Endoscopy;  Laterality: N/A;  830   POLYPECTOMY  06/25/2016   Procedure: POLYPECTOMY;  Surgeon: Rogene Houston, MD;  [facility];  Service: Endoscopy;;  colon    toe biopsy       Allergies  Allergen Reactions   Fish Allergy Anaphylaxis and Swelling    Throat closes.   Bactrim Ds [Sulfamethoxazole-Trimethoprim]     Hives    Ciprocinonide [Fluocinolone] Hives    " made me feel awful"    Codeine Other (See Comments)    DROPS PT. BLOOD PRESSURE   Penicillins     Has patient had a PCN reaction causing immediate rash, facial/tongue/throat swelling, SOB or lightheadedness with hypotension:unsure Has patient had a PCN reaction causing severe rash involving mucus membranes or skin necrosis:unsure Has patient had a PCN reaction that required hospitalization:No Has patient had a PCN reaction occurring within the last 10 years:No If all of the above answers are "NO", then may proceed with Cephalosporin use. Childhood  reaction    Sulfa Antibiotics Hives, Diarrhea, Nausea Only and Rash      Family History  Problem Relation Age of Onset   Dementia Father    Cancer Mother    Migraines Mother    Healthy Daughter    Healthy Daughter    Healthy Daughter    Cancer Maternal Grandmother    Heart disease Maternal Grandmother    Diabetes Maternal Grandfather    Heart attack Paternal Grandmother      Social History Ms. Wortley reports that she has never smoked. She has never used smokeless tobacco. Ms. Revuelta reports no history of alcohol use.   Review of Systems CONSTITUTIONAL: No weight loss, fever, chills, weakness or fatigue.  HEENT: Eyes: No visual loss, blurred vision,  double vision or yellow sclerae.No hearing loss, sneezing, congestion, runny nose or sore throat.  SKIN: No rash or itching.  CARDIOVASCULAR: per hpi RESPIRATORY: No shortness of breath, cough or sputum.  GASTROINTESTINAL: No anorexia, nausea, vomiting or diarrhea. No abdominal pain or blood.  GENITOURINARY: No burning on urination, no polyuria NEUROLOGICAL: No headache, dizziness, syncope, paralysis, ataxia, numbness or tingling in the extremities. No change in bowel or bladder control.  MUSCULOSKELETAL: No muscle, back pain, joint pain or stiffness.  LYMPHATICS: No enlarged nodes. No history of splenectomy.  PSYCHIATRIC: No history of depression or anxiety.  ENDOCRINOLOGIC: No reports of sweating, cold or heat intolerance. No polyuria or polydipsia.  Marland Kitchen   Physical Examination Today's Vitals   08/19/22 1536 08/19/22 1630  BP: (!) 140/80 (!) 140/75  Pulse: 71   SpO2: 97%   Weight: 141 lb 9.6 oz (64.2 kg)   Height: '5\' 7"'$  (1.702 m)    Body mass index is 22.18 kg/m.  Gen: resting comfortably, no acute distress HEENT: no scleral icterus, pupils equal round and reactive, no palptable cervical adenopathy,  CV: RRR, no mr/g no jvd Resp: Clear to auscultation bilaterally GI: abdomen is soft, non-tender, non-distended,  normal bowel sounds, no hepatosplenomegaly MSK: extremities are warm, no edema.  Skin: warm, no rash Neuro:  no focal deficits Psych: appropriate affect   Diagnostic Studies 10/2020 monitor 24 hr monitor Patch Wear Time:  1 days and 1 hours (2022-04-20T15:06:18-0400 to 2022-04-21T16:51:52-0400)   Patient had a min HR of 50 bpm, max HR of 126 bpm, and avg HR of 65 bpm. Predominant underlying rhythm was Sinus Rhythm. Isolated SVEs were rare (<1.0%), and no SVE Couplets or SVE Triplets were present. Isolated VEs were rare (<1.0%), and no VE Couplets  or VE Triplets were present.   01/2022 monitor 6 day monitor Rare supraventricualr ectopy in the form of isolated PVCs, couplets Rare ventricular ectopy in the form of isolated PVCs Patient triggered events without reported symptoms No significant arrhythmias     Patch Wear Time:  5 days and 19 hours (2023-06-08T11:54:31-0400 to 2023-06-14T07:47:12-398)   Patient had a min HR of 52 bpm, max HR of 146 bpm, and avg HR of 75 bpm. Predominant underlying rhythm was Sinus Rhythm. Slight P wave morphology changes were noted. Isolated SVEs were rare (<1.0%), SVE Couplets were rare (<1.0%), and no SVE Triplets  were present. Isolated VEs were rare (<1.0%), and no VE Couplets or VE Triplets were present.  Assessment and Plan  1. PSVT - long history of palpitations  - recent monitor with just benign ectopy - did not tolerate toprol or dilt, doing well on low dose atenolol. We will continue.    2. HTN -often with white coat HTN, elevated here but will update [place] on home bp's end of the week. Room to titrate norvasc if needed  3. Hyperlipidemia - LDL at goal, mildly elevated TGs but was not true fasting sample. Monitor at this time.       Arnoldo Lenis, M.D.

## 2022-08-19 NOTE — Patient Instructions (Addendum)
Medication Instructions:  Continue all current medications.  Labwork: none  Testing/Procedures: none  Follow-Up: 6 months    Any Other Special Instructions Will Be Listed Below (If Applicable). Please call the office or send mychart message on Friday with update on blood pressure readings.   If you need a refill on your cardiac medications before your next appointment, please call your pharmacy.

## 2022-08-27 ENCOUNTER — Telehealth: Payer: Self-pay | Admitting: Cardiology

## 2022-08-27 NOTE — Telephone Encounter (Signed)
Forward to Dr.Branch for review.

## 2022-08-27 NOTE — Telephone Encounter (Signed)
Patient calling wit BP readings:  12/19 4:50 pm 130/68 12/20 8:45 am 122/75  12/21 8 am 103/63 HR 75 12/25 around 9 am 115/66 HR 62 12/26 9 am 106/59 HR 66 12/27 8:30 am 129/74 HR 67

## 2022-08-29 NOTE — Telephone Encounter (Signed)
Messaged from MD relayed to patient.

## 2022-08-29 NOTE — Telephone Encounter (Signed)
BP's look good, no changes   Zandra Abts MD

## 2022-09-05 ENCOUNTER — Other Ambulatory Visit: Payer: Self-pay | Admitting: Adult Health

## 2022-09-05 ENCOUNTER — Ambulatory Visit (INDEPENDENT_AMBULATORY_CARE_PROVIDER_SITE_OTHER): Payer: Medicare Other | Admitting: Gastroenterology

## 2022-09-05 ENCOUNTER — Encounter (INDEPENDENT_AMBULATORY_CARE_PROVIDER_SITE_OTHER): Payer: Self-pay | Admitting: Gastroenterology

## 2022-09-05 ENCOUNTER — Other Ambulatory Visit: Payer: Self-pay | Admitting: Cardiology

## 2022-09-05 VITALS — BP 121/75 | HR 69 | Temp 97.8°F | Ht 67.0 in | Wt 140.7 lb

## 2022-09-05 DIAGNOSIS — K581 Irritable bowel syndrome with constipation: Secondary | ICD-10-CM

## 2022-09-05 MED ORDER — DICYCLOMINE HCL 10 MG PO CAPS: 10.0000 mg | ORAL_CAPSULE | Freq: Three times a day (TID) | ORAL | 1 refills | Status: DC | PRN

## 2022-09-05 NOTE — Patient Instructions (Addendum)
Increase water intake, aim for atleast 64 oz per day I Am providing the low FODMAP food guide as this can be helpful to decrease abdominal discomfort and help with constipation Start taking Miralax 1 capful every day for one week. If bowel movements do not improve, increase to 1 capful every 12 hours. If after two weeks there is no improvement, increase to 1 capful every 8 hours We will check thyroid function to make sure this is not contributing to your symptoms, though I think this is likely related to IBS I will send dicyclomine '10mg'$  to be taken up to three times per day as needed.  Follow up 3 months

## 2022-09-05 NOTE — Progress Notes (Addendum)
Referring Provider: [name], MD Primary Care Physician:  [name], MD Primary GI Physician: Jenetta Downer   Chief Complaint  Patient presents with   [facility]    Has concerns about bloating and constipation. Stool has been little hard balls. Gets full quick when just eating a little bit.    HPI:   April Knox is a 67 y.o. female with past medical history of GERD and IBS-C.  Patient presenting today for constipation, bloating.  Last seen December 2021, at that time, having bloating intermittently for the past week as well as constipation. Notes similar episodes throughout her life. Taking omeprazole '20mg'$  daily for GERD with good results.   Recommended to continue omeprazole '20mg'$  daily, take miralax 1 capful daily, IBgard q8-12h.   Present:  Patient states that she has some abdominal discomfort (periumbilical) and bloating. She notes that she is able to have a BM but states that stools are harder and often with a lot of stool balls. She notes that she eats a lot of yogurt, has cut back some on this as she was unsure if this was causing her symptoms. She sometimes feels that she is full and is not hungry though she has gained some weight. Her PCP told her to take gas x but she does not feel that this helps. She denies any rectal bleeding or melena. She does not drink any water. Diet is high in fruits but not many veggies. She previously used some miralax but stopped because she felt better. She notes some recent mild depression and a little less activity than normal. No nausea or vomiting    Last EGD: 12/16/2019 - gastric polyps, no other findings Last Colonoscopy: October, 2017 revealing small tubular adenoma and sigmoid diverticulosis  Repeat TCS in October 2024  Past Medical History:  Diagnosis Date   Abnormal Pap smear    Abnormal Papanicolaou smear of cervix with positive human papilloma virus (HPV) test 04/06/2018   Pap was LSIL with +HPV, will need colpo____    Anxiety    Back pain 03/07/2014   Cervical spine disease    Mild   Chest pain    Elevated cholesterol    Ganglion cyst of left foot 12/06/2013   H/O bilateral breast implants 03/03/2013   Hematuria 03/28/2014   Hot flashes 12/06/2013   Hypertension    Menopausal symptoms 12/06/2013   Mental disorder    anxiety   Migraine with visual aura    Osteopenia after menopause 10/06/2018   DEXA 09/28/2018, osteopenia, take calcium vitamin D and stay active    PSVT (paroxysmal supraventricular tachycardia)    Documented by event recorder   Rectocele 03/03/2013   RUQ pain 03/07/2014   Vaginal discharge 02/24/2014   Vaginal dryness, menopausal 12/06/2013   Vaginal Pap smear, abnormal    Yeast infection 02/24/2014    Past Surgical History:  Procedure Laterality Date   BIOPSY N/A 12/16/2019   Procedure: BIOPSY;  Surgeon: Rogene Houston, MD;  [facility];  Service: Endoscopy;  Laterality: N/A;   BREAST ENHANCEMENT SURGERY     COLONOSCOPY N/A 06/25/2016   Procedure: COLONOSCOPY;  Surgeon: Rogene Houston, MD;  [facility];  Service: Endoscopy;  Laterality: N/A;  1:00-moved to 1230 Ann to notify pt   ESOPHAGOGASTRODUODENOSCOPY (EGD) WITH PROPOFOL N/A 12/16/2019   Procedure: ESOPHAGOGASTRODUODENOSCOPY (EGD) WITH PROPOFOL;  Surgeon: Rogene Houston, MD;  [facility];  Service: Endoscopy;  Laterality: N/A;  830  POLYPECTOMY  06/25/2016   Procedure: POLYPECTOMY;  Surgeon: Rogene Houston, MD;  [facility];  Service: Endoscopy;;  colon    toe biopsy      Current Outpatient Medications  Medication Sig Dispense Refill   acetaminophen (TYLENOL) 500 MG tablet Take 500 mg by mouth every 6 (six) hours as needed (for pain).      ALPRAZolam (XANAX) 1 MG tablet TAKE ONE TABLET BY MOUTH TWICE DAILY AS NEEDED FOR ANXIETY 40 tablet 0   amLODipine (NORVASC) 5 MG tablet Take 5 mg by mouth daily.     atenolol (TENORMIN) 25 MG tablet TAKE 1/2 TABLET BY MOUTH ONCE DAILY 45 tablet 3    estradiol (ESTRACE) 0.1 MG/GM vaginal cream INSERT ONE APPLICATORFUL VAGINALLY THREE TIMES A WEEK 42.5 g 3   fluticasone (FLONASE) 50 MCG/ACT nasal spray Place 1 spray into both nostrils daily.     levocetirizine (XYZAL) 5 MG tablet Take 5 mg by mouth daily. In the morning per the patient.     omeprazole (PRILOSEC) 20 MG capsule TAKE ONE CAPSULE BY MOUTH ONCE DAILY 90 capsule 0   rosuvastatin (CRESTOR) 10 MG tablet TAKE ONE TABLET BY MOUTH ONCE DAILY 90 tablet 3   Rimegepant Sulfate (NURTEC) 75 MG TBDP Take 75 mg by mouth daily as needed. Dispense 10 or max allowed by insurance. Triptans contraindicated due to retinal migraines (Patient not taking: Reported on 08/19/2022) 10 tablet 6   terconazole (TERAZOL 7) 0.4 % vaginal cream Place 1 applicator vaginally at bedtime. (Patient not taking: Reported on 08/19/2022) 45 g 3   No current facility-administered medications for this visit.    Allergies as of 09/05/2022 - Review Complete 09/05/2022  Allergen Reaction Noted   Fish allergy Anaphylaxis and Swelling 06/23/2016   Bactrim ds [sulfamethoxazole-trimethoprim]  01/19/2014   Ciprocinonide [fluocinolone] Hives 01/30/2014   Codeine Other (See Comments) 06/23/2016   Penicillins  06/26/2011   Sulfa antibiotics Hives, Diarrhea, Nausea Only, and Rash 11/01/2013    Family History  Problem Relation Age of Onset   Dementia Father    Cancer Mother    Migraines Mother    Healthy Daughter    Healthy Daughter    Healthy Daughter    Cancer Maternal Grandmother    Heart disease Maternal Grandmother    Diabetes Maternal Grandfather    Heart attack Paternal Grandmother     Social History   Socioeconomic History   Marital status: Widowed    Spouse name: Not on file   Number of children: 3   Years of education: Not on file   Highest education level: Not on file  Occupational History   Occupation: Unemployed  Tobacco Use   Smoking status: Never    Passive exposure: Never   Smokeless  tobacco: Never  Vaping Use   Vaping Use: Never used  Substance and Sexual Activity   Alcohol use: No    Alcohol/week: 0.0 standard drinks of alcohol   Drug use: No   Sexual activity: Yes    Birth control/protection: Post-menopausal  Other Topics Concern   Not on file  Social History Narrative   Lives alone   Right handed   Caffeine: none   Social Determinants of Health   Financial Resource Strain: Medium Risk (06/27/2022)   Overall Financial Resource Strain (CARDIA)    Difficulty of Paying Living Expenses: Somewhat hard  Food Insecurity: Food Insecurity Present (06/27/2022)   Hunger Vital Sign    Worried About Running Out of Food in the  Last Year: Never true    [place] in the Last Year: Sometimes true  Transportation Needs: No Transportation Needs (06/27/2022)   PRAPARE - Hydrologist (Medical): No    Lack of Transportation (Non-Medical): No  Physical Activity: Insufficiently Active (06/27/2022)   Exercise Vital Sign    Days of Exercise per Week: 1 day    Minutes of Exercise per Session: 20 min  Stress: No Stress Concern Present (06/27/2022)   [place]    Feeling of Stress : Only a little  Social Connections: Socially Isolated (06/27/2022)   Social Connection and Isolation Panel [NHANES]    Frequency of Communication with Friends and Family: Twice a week    Frequency of Social Gatherings with Friends and Family: Never    Attends Religious Services: Never    Marine scientist or Organizations: No    Attends Archivist Meetings: Never    Marital Status: Widowed   Review of systems General: negative for malaise, night sweats, fever, chills, weight loss Neck: Negative for lumps, goiter, pain and significant neck swelling Resp: Negative for cough, wheezing, dyspnea at rest CV: Negative for chest pain, leg swelling, palpitations, orthopnea GI: denies  melena, hematochezia, nausea, vomiting, diarrhea, dysphagia, odyonophagia, early satiety or unintentional weight loss. +constipation +bloating  MSK: Negative for joint pain or swelling, back pain, and muscle pain. Derm: Negative for itching or rash Psych: Denies depression, anxiety, memory loss, confusion. No homicidal or suicidal ideation.  Heme: Negative for prolonged bleeding, bruising easily, and swollen nodes. Endocrine: Negative for cold or heat intolerance, polyuria, polydipsia and goiter. Neuro: negative for tremor, gait imbalance, syncope and seizures. The remainder of the review of systems is noncontributory.  Physical Exam: BP 121/75 (BP Location: Left Arm, Patient Position: Sitting, Cuff Size: Normal)   Pulse 69   Temp 97.8 F (36.6 C) (Oral)   Ht '5\' 7"'$  (1.702 m)   Wt 140 lb 11.2 oz (63.8 kg)   BMI 22.04 kg/m  General:   Alert and oriented. No distress noted. Pleasant and cooperative.  Head:  Normocephalic and atraumatic. Eyes:  Conjuctiva clear without scleral icterus. Mouth:  Oral mucosa pink and moist. Good dentition. No lesions. Heart: Normal rate and rhythm, s1 and s2 heart sounds present.  Lungs: Clear lung sounds in all lobes. Respirations equal and unlabored. Abdomen:  +BS, soft, non-tender and non-distended. No rebound or guarding. No HSM or masses noted. Derm: No palmar erythema or jaundice Msk:  Symmetrical without gross deformities. Normal posture. Extremities:  Without edema. Neurologic:  Alert and  oriented x4 Psych:  Alert and cooperative. Normal mood and affect.  Invalid input(s): "6 MONTHS"   ASSESSMENT: SHAQUINA GILLHAM is a 67 y.o. female presenting today for constipation and bloating.  Notably patient diagnosed with IBS-C previously, reporting harder stools and stool balls. She does not drink any water. Had TSH checked in April with PCP that was normal. She reports periumbilical pain at times and feeling "full." Never feels that bowel are empty.  Denies rectal bleeding, melena and has actually gained weight.  I explained indications of IBS with the patient to include hypersensitivity of the bowels that is often heavily influenced by certain food/drink triggers and emotional/mental triggers. We discussed the importance of keeping a food journal x2-3 weeks and utilizing the low FODMAP guide to help determine specific trigger foods and increasing foods that tend to be more well tolerated.  I also discussed the use of anti spasmodics for this condition which help to calm down the activity in the bowels, decreasing cramping and looser stools. Should start miralax and increase water intake. Will send bentyl '10mg'$  to take up to TID.   Last colonoscopy in October 2017, she is due again in October. Will get her scheduled for this at follow up visit.   PLAN:  Start taking Miralax 1 capful every day for one week. If bowel movements do not improve, increase to 1 capful every 12 hours. If after two weeks there is no improvement, increase to 1 capful every 8 hours 2. Good water intake, aim for 64oz/day 3. Plan for Colonoscopy in oct 2024 4. Low fodmap food guide 5. Bentyl '10mg'$  TID PRN  All questions were answered, patient verbalized understanding and is in agreement with plan as outlined above.    Follow up 1 year  Nathanyal Ashmead L. Alver Sorrow, MSN, APRN, AGNP-C Adult-Gerontology Nurse Practitioner [facility]  I have reviewed the note and agree with the APP's assessment as described in this progress note  Maylon Peppers, MD Gastroenterology and Hepatology [facility]

## 2022-09-19 ENCOUNTER — Telehealth: Payer: Self-pay | Admitting: Cardiology

## 2022-09-19 ENCOUNTER — Other Ambulatory Visit: Payer: Self-pay | Admitting: Cardiology

## 2022-09-19 MED ORDER — ATENOLOL 25 MG PO TABS: 12.5000 mg | ORAL_TABLET | Freq: Every day | ORAL | 3 refills | Status: DC

## 2022-09-19 NOTE — Telephone Encounter (Signed)
Patient informed via vm. 

## 2022-09-19 NOTE — Telephone Encounter (Signed)
Pt c/o medication issue:  1. Name of Medication: atenolol (TENORMIN) 25 MG tablet   2. How are you currently taking this medication (dosage and times per day)? TAKE 1/2 TABLET BY MOUTH ONCE DAILY   3. Are you having a reaction (difficulty breathing--STAT)? no  4. What is your medication issue? Pharmacy is calling because patient is asking for a while table and not a half a table. Pharmacy is calling to get a new prescription. Please advise

## 2022-09-19 NOTE — Telephone Encounter (Signed)
Per Tye Maryland, pharmacist at [facility], says patient is running out of atenolol because she has been taking 25 mg vs 12.5 mg. Will reach out to patient to verify dose.   Patient contacted and says she inadvertently took 25 mg atenolol daily thinking she was taking her allergy pill. Advised that she could pay out of pocket price for quantity needed until refill is due. Says she does not have money to purchase extra tablets. Advised that message would be sent to provider for recommendations. Patient said that she was told by Harl Bowie that she could take an additional 12.5 mg daily as needed. Patient has been taking 25 mg atenolol since 08/04/2024. Please advise

## 2022-09-22 DIAGNOSIS — L570 Actinic keratosis: Secondary | ICD-10-CM | POA: Diagnosis not present

## 2022-09-22 DIAGNOSIS — D239 Other benign neoplasm of skin, unspecified: Secondary | ICD-10-CM | POA: Diagnosis not present

## 2022-09-22 DIAGNOSIS — Z1283 Encounter for screening for malignant neoplasm of skin: Secondary | ICD-10-CM | POA: Diagnosis not present

## 2022-09-25 ENCOUNTER — Ambulatory Visit (INDEPENDENT_AMBULATORY_CARE_PROVIDER_SITE_OTHER): Payer: Medicare Other | Admitting: Gastroenterology

## 2022-09-29 ENCOUNTER — Other Ambulatory Visit: Payer: Self-pay | Admitting: Adult Health

## 2022-10-06 DIAGNOSIS — M8589 Other specified disorders of bone density and structure, multiple sites: Secondary | ICD-10-CM | POA: Diagnosis not present

## 2022-10-06 DIAGNOSIS — M810 Age-related osteoporosis without current pathological fracture: Secondary | ICD-10-CM | POA: Diagnosis not present

## 2022-10-16 DIAGNOSIS — L309 Dermatitis, unspecified: Secondary | ICD-10-CM | POA: Diagnosis not present

## 2022-10-17 DIAGNOSIS — Z1231 Encounter for screening mammogram for malignant neoplasm of breast: Secondary | ICD-10-CM | POA: Diagnosis not present

## 2022-10-23 ENCOUNTER — Encounter (INDEPENDENT_AMBULATORY_CARE_PROVIDER_SITE_OTHER): Payer: Self-pay | Admitting: Gastroenterology

## 2022-10-27 ENCOUNTER — Other Ambulatory Visit: Payer: Self-pay | Admitting: Adult Health

## 2022-10-30 ENCOUNTER — Other Ambulatory Visit: Payer: Self-pay | Admitting: Adult Health

## 2022-11-13 DIAGNOSIS — Z0000 Encounter for general adult medical examination without abnormal findings: Secondary | ICD-10-CM | POA: Diagnosis not present

## 2022-11-13 DIAGNOSIS — K581 Irritable bowel syndrome with constipation: Secondary | ICD-10-CM | POA: Diagnosis not present

## 2022-11-13 DIAGNOSIS — Z6821 Body mass index (BMI) 21.0-21.9, adult: Secondary | ICD-10-CM | POA: Diagnosis not present

## 2022-11-13 DIAGNOSIS — I10 Essential (primary) hypertension: Secondary | ICD-10-CM | POA: Diagnosis not present

## 2022-11-13 DIAGNOSIS — J309 Allergic rhinitis, unspecified: Secondary | ICD-10-CM | POA: Diagnosis not present

## 2022-11-13 DIAGNOSIS — G43911 Migraine, unspecified, intractable, with status migrainosus: Secondary | ICD-10-CM | POA: Diagnosis not present

## 2022-11-13 DIAGNOSIS — M5459 Other low back pain: Secondary | ICD-10-CM | POA: Diagnosis not present

## 2022-11-24 ENCOUNTER — Other Ambulatory Visit: Payer: Self-pay | Admitting: Adult Health

## 2022-11-27 ENCOUNTER — Telehealth: Payer: Self-pay | Admitting: Adult Health

## 2022-11-27 MED ORDER — METRONIDAZOLE 500 MG PO TABS: 500.0000 mg | ORAL_TABLET | Freq: Two times a day (BID) | ORAL | 0 refills | Status: DC

## 2022-11-27 NOTE — Telephone Encounter (Addendum)
Pt has discomfort in lower part of belly and she feels bloated. Some vaginal odor. Pt states she knows she needs Flagyl. Please advise. Thanks! [place]

## 2022-11-27 NOTE — Telephone Encounter (Signed)
April Knox is calling asking if you would call her in flagyl to [facility] in [place].

## 2022-11-27 NOTE — Addendum Note (Signed)
Addended by: Derrek Monaco A on: 11/27/2022 11:58 AM   Modules accepted: Orders

## 2022-11-27 NOTE — Telephone Encounter (Signed)
Rx has been sent in for flagyl.

## 2022-12-08 ENCOUNTER — Ambulatory Visit (INDEPENDENT_AMBULATORY_CARE_PROVIDER_SITE_OTHER): Payer: Medicare Other | Admitting: Gastroenterology

## 2022-12-24 ENCOUNTER — Other Ambulatory Visit: Payer: Self-pay | Admitting: Adult Health

## 2022-12-29 ENCOUNTER — Telehealth: Payer: Self-pay | Admitting: Adult Health

## 2022-12-29 MED ORDER — DOXYCYCLINE HYCLATE 100 MG PO TABS: 100.0000 mg | ORAL_TABLET | Freq: Two times a day (BID) | ORAL | 0 refills | Status: DC

## 2022-12-29 NOTE — Telephone Encounter (Signed)
Pt is having  pain during sexual activity. Please advise

## 2022-12-29 NOTE — Telephone Encounter (Signed)
Left message @ 12:26 pm stating she was having some pain. If she is noticing dryness, get a good lubricant per JAG. If it's something else, can send a mychart message or call the office. JSY

## 2022-12-29 NOTE — Telephone Encounter (Signed)
Pt states she is having pain at belly button. She wonders if it's PID. Pt requests [name] call in something and if it don't get better, she will schedule an appt. JSY

## 2022-12-29 NOTE — Telephone Encounter (Signed)
Has pain with sex and  near belly button, and had unprotected sex will rx doxycyline and if it is not better make appt.

## 2022-12-29 NOTE — Addendum Note (Signed)
Addended by: Cyril Mourning A on: 12/29/2022 05:32 PM   Modules accepted: Orders

## 2023-01-22 ENCOUNTER — Other Ambulatory Visit: Payer: Self-pay | Admitting: Adult Health

## 2023-02-02 DIAGNOSIS — A681 Tick-borne relapsing fever: Secondary | ICD-10-CM | POA: Diagnosis not present

## 2023-02-02 DIAGNOSIS — I10 Essential (primary) hypertension: Secondary | ICD-10-CM | POA: Diagnosis not present

## 2023-02-02 DIAGNOSIS — Z6822 Body mass index (BMI) 22.0-22.9, adult: Secondary | ICD-10-CM | POA: Diagnosis not present

## 2023-02-05 ENCOUNTER — Ambulatory Visit (INDEPENDENT_AMBULATORY_CARE_PROVIDER_SITE_OTHER): Payer: Medicare HMO | Admitting: Gastroenterology

## 2023-02-05 ENCOUNTER — Encounter (INDEPENDENT_AMBULATORY_CARE_PROVIDER_SITE_OTHER): Payer: Self-pay | Admitting: Gastroenterology

## 2023-02-05 VITALS — BP 139/79 | HR 64 | Temp 98.1°F | Ht 67.0 in | Wt 144.2 lb

## 2023-02-05 DIAGNOSIS — K581 Irritable bowel syndrome with constipation: Secondary | ICD-10-CM | POA: Diagnosis not present

## 2023-02-05 DIAGNOSIS — K219 Gastro-esophageal reflux disease without esophagitis: Secondary | ICD-10-CM | POA: Diagnosis not present

## 2023-02-05 NOTE — Progress Notes (Addendum)
Referring Provider: Toma Deiters, MD Primary Care Physician:  Toma Deiters, MD Primary GI Physician: Levon Hedger   Chief Complaint  Patient presents with   Irritable Bowel Syndrome    Follow up on IBS. Has concerns about weight gain, bloating, gas, diarrhea and constipation.    HPI:   April Knox is a 67 y.o. female with past medical history of GERD and IBS   Patient presenting today for folow up of GERD and IBS.  Last seen January 2024, at that time having abdominal discomfort (periumbilical) and bloating. She notes that she is able to have a BM but states that stools are harder and often with a lot of stool balls. She sometimes feels that she is full and is not hungry though she has gained some weight. Her PCP told her to take gas x but she does not feel that this helps. She does not drink any water. Diet is high in fruits but not many veggies. She previously used some miralax but stopped because she felt better. She notes some recent mild depression and a little less activity than normal.   Recommended to start miralax, good water intake, low FODMAP food guide, bentyl 10mg  TID PRN, colonoscopy in October.   Present:  Patient states that she has a lot of gas and bloating once she eats. She notes that she has a dull pain in her mid to lower abdomen. She is unsure if this improves with defecation. Takes tylenol which helps some. She is having a BM almost daily but stools are harder stool balls, though she notes that she has a larger BM that feels like she is emptying out sufficiently every 3-4 days. She is not drinking water. She is not taking anything for constipation. She notes she has actually gained weight. She is taking some gas x, unsure if this helps her symptoms. No nausea or vomiting.  She denies early satiety. Notes she does not eat a lot but unsure why she is gaining weight. She is taking a probiotic that her pharmacist recommended. She thinks she saw some improvement with  this. She notes sometimes during intercourse she has periumbilical pain. No rectal bleeding or melena. She tried bentyl and notes she took 1/2 tablet and it made her drowsy. Does not think that certain foods make it worse.   She took linzess in the past which gave her diarrhea.   She takes omeprazole 20mg  daily with good results. Denies heartburn, No dysphagia or odynophagia. She did have an episode of food regurgitation after laying down, does note she ate a bag of trail mix late at night prior to bed. No other episodes of this.   Last EGD: 12/16/2019 - gastric polyps, no other findings Last Colonoscopy: October, 2017 revealing small tubular adenoma and sigmoid diverticulosis   Repeat TCS in October 2024  Past Medical History:  Diagnosis Date   Abnormal Pap smear    Abnormal Papanicolaou smear of cervix with positive human papilloma virus (HPV) test 04/06/2018   Pap was LSIL with +HPV, will need colpo____   Anxiety    Back pain 03/07/2014   Cervical spine disease    Mild   Chest pain    Elevated cholesterol    Ganglion cyst of left foot 12/06/2013   H/O bilateral breast implants 03/03/2013   Hematuria 03/28/2014   Hot flashes 12/06/2013   Hypertension    Menopausal symptoms 12/06/2013   Mental disorder    anxiety   Migraine with visual  aura    Osteopenia after menopause 10/06/2018   DEXA 09/28/2018, osteopenia, take calcium vitamin D and stay active    PSVT (paroxysmal supraventricular tachycardia)    Documented by event recorder   Rectocele 03/03/2013   RUQ pain 03/07/2014   Vaginal discharge 02/24/2014   Vaginal dryness, menopausal 12/06/2013   Vaginal Pap smear, abnormal    Yeast infection 02/24/2014    Past Surgical History:  Procedure Laterality Date   BIOPSY N/A 12/16/2019   Procedure: BIOPSY;  Surgeon: Malissa Hippo, MD;  [facility];  Service: Endoscopy;  Laterality: N/A;   BREAST ENHANCEMENT SURGERY     COLONOSCOPY N/A 06/25/2016   Procedure: COLONOSCOPY;  Surgeon:  Malissa Hippo, MD;  [facility]: [facility];  Service: Endoscopy;  Laterality: N/A;  1:00-moved to 1230 Ann to notify pt   ESOPHAGOGASTRODUODENOSCOPY (EGD) WITH PROPOFOL N/A 12/16/2019   Procedure: ESOPHAGOGASTRODUODENOSCOPY (EGD) WITH PROPOFOL;  Surgeon: Malissa Hippo, MD;  [facility];  Service: Endoscopy;  Laterality: N/A;  830   POLYPECTOMY  06/25/2016   Procedure: POLYPECTOMY;  Surgeon: Malissa Hippo, MD;  [facility];  Service: Endoscopy;;  colon    toe biopsy      Current Outpatient Medications  Medication Sig Dispense Refill   acetaminophen (TYLENOL) 500 MG tablet Take 500 mg by mouth every 6 (six) hours as needed (for pain).      ALPRAZolam (XANAX) 1 MG tablet TAKE ONE TABLET BY MOUTH TWICE DAILY AS NEEDED FOR ANXIETY 40 tablet 0   amLODipine (NORVASC) 5 MG tablet Take 5 mg by mouth daily.     atenolol (TENORMIN) 25 MG tablet Take 0.5 tablets (12.5 mg total) by mouth daily. May take an additional 12.5 mg daily as needed for palpitations 90 tablet 3   dicyclomine (BENTYL) 10 MG capsule Take 1 capsule (10 mg total) by mouth 3 (three) times daily as needed for spasms. 90 capsule 1   estradiol (ESTRACE) 0.1 MG/GM vaginal cream INSERT ONE APPLICATORFUL VAGINALLY THREE TIMES A WEEK 42.5 g 3   fluticasone (FLONASE) 50 MCG/ACT nasal spray Place 1 spray into both nostrils daily.     levocetirizine (XYZAL) 5 MG tablet Take 5 mg by mouth daily. In the morning per the patient.     omeprazole (PRILOSEC) 20 MG capsule TAKE ONE CAPSULE BY MOUTH ONCE DAILY 90 capsule 0   rosuvastatin (CRESTOR) 10 MG tablet TAKE ONE TABLET BY MOUTH ONCE DAILY 90 tablet 3   Rimegepant Sulfate (NURTEC) 75 MG TBDP Take 75 mg by mouth daily as needed. Dispense 10 or max allowed by insurance. Triptans contraindicated due to retinal migraines (Patient not taking: Reported on 02/05/2023) 10 tablet 6   terconazole (TERAZOL 7) 0.4 % vaginal cream Place 1 applicator vaginally at bedtime. (Patient not  taking: Reported on 08/19/2022) 45 g 3   No current facility-administered medications for this visit.    Allergies as of 02/05/2023 - Review Complete 02/05/2023  Allergen Reaction Noted   Fish allergy Anaphylaxis and Swelling 06/23/2016   Bactrim ds [sulfamethoxazole-trimethoprim]  01/19/2014   Ciprocinonide [fluocinolone] Hives 01/30/2014   Codeine Other (See Comments) 06/23/2016   Penicillins  06/26/2011   Sulfa antibiotics Hives, Diarrhea, Nausea Only, and Rash 11/01/2013    Family History  Problem Relation Age of Onset   Dementia Father    Cancer Mother    Migraines Mother    Healthy Daughter    Healthy Daughter    Healthy Daughter  Cancer Maternal Grandmother    Heart disease Maternal Grandmother    Diabetes Maternal Grandfather    Heart attack Paternal Grandmother     Social History   Socioeconomic History   Marital status: Widowed    Spouse name: Not on file   Number of children: 3   Years of education: Not on file   Highest education level: Not on file  Occupational History   Occupation: Unemployed  Tobacco Use   Smoking status: Never    Passive exposure: Never   Smokeless tobacco: Never  Vaping Use   Vaping Use: Never used  Substance and Sexual Activity   Alcohol use: No    Alcohol/week: 0.0 standard drinks of alcohol   Drug use: No   Sexual activity: Yes    Birth control/protection: Post-menopausal  Other Topics Concern   Not on file  Social History Narrative   Lives alone   Right handed   Caffeine: none   Social Determinants of Health   Financial Resource Strain: Medium Risk (06/27/2022)   Overall Financial Resource Strain (CARDIA)    Difficulty of Paying Living Expenses: Somewhat hard  Food Insecurity: Food Insecurity Present (06/27/2022)   Hunger Vital Sign    Worried About Running Out of Food in the Last Year: Never true    Ran Out of Food in the Last Year: Sometimes true  Transportation Needs: No Transportation Needs (06/27/2022)    PRAPARE - Administrator, Civil Service (Medical): No    Lack of Transportation (Non-Medical): No  Physical Activity: Insufficiently Active (06/27/2022)   Exercise Vital Sign    Days of Exercise per Week: 1 day    Minutes of Exercise per Session: 20 min  Stress: No Stress Concern Present (06/27/2022)   [employer] of Occupational Health - Occupational Stress Questionnaire    Feeling of Stress : Only a little  Social Connections: Socially Isolated (06/27/2022)   Social Connection and Isolation Panel [NHANES]    Frequency of Communication with Friends and Family: Twice a week    Frequency of Social Gatherings with Friends and Family: Never    Attends Religious Services: Never    Database administrator or Organizations: No    Attends Banker Meetings: Never    Marital Status: Widowed   ystems General: negative for malaise, night sweats, fever, chills, weight loss Neck: Negative for lumps, goiter, pain and significant neck swelling Resp: Negative for cough, wheezing, dyspnea at rest CV: Negative for chest pain, leg swelling, palpitations, orthopnea GI: denies melena, hematochezia, nausea, vomiting, diarrhea, dysphagia, odyonophagia, early satiety or unintentional weight loss. +constipation +bloating +abdominal discomfort  MSK: Negative for joint pain or swelling, back pain, and muscle pain. Derm: Negative for itching or rash Psych: Denies depression, anxiety, memory loss, confusion. No homicidal or suicidal ideation.  Heme: Negative for prolonged bleeding, bruising easily, and swollen nodes. Endocrine: Negative for cold or heat intolerance, polyuria, polydipsia and goiter. Neuro: negative for tremor, gait imbalance, syncope and seizures. The remainder of the review of systems is noncontributory.  Physical Exam: BP 139/79   Pulse 64   Temp 98.1 F (36.7 C) (Oral)   Ht 5\' 7"  (1.702 m)   Wt 144 lb 3.2 oz (65.4 kg)   BMI 22.58 kg/m  General:   Alert and  oriented. No distress noted. Pleasant and cooperative.  Head:  Normocephalic and atraumatic. Eyes:  Conjuctiva clear without scleral icterus. Mouth:  Oral mucosa pink and moist. Good dentition. No lesions. Heart:  Normal rate and rhythm, s1 and s2 heart sounds present.  Lungs: Clear lung sounds in all lobes. Respirations equal and unlabored. Abdomen:  +BS, soft, non-tender and non-distended. No rebound or guarding. No HSM or masses noted. Derm: No palmar erythema or jaundice Msk:  Symmetrical without gross deformities. Normal posture. Extremities:  Without edema. Neurologic:  Alert and  oriented x4 Psych:  Alert and cooperative. Normal mood and affect.  Invalid input(s): "6 MONTHS"   ASSESSMENT: April Knox is a 67 y.o. female presenting today for follow up of IBS and GERD  GERD: well controlled on omeprazole 20mg  daily, one episode of regurgitation after eating late and lying down. Will continue with current PPI regimen, should avoid eating late, stay upright 2-3 hours after eating.  IBS: continues with constipation, bloating, some abdominal discomfort. She has actually gained some weight. No rectal bleeding, melena or diarrhea. She has no early satiety, nausea or vomiting. She did not increase water intake or try miralax as instructed previously. Having harder stool balls at times. Suspect much of her symptoms are stemming from constipation, instructed to increase water intake, aim for 64 oz per day and start miralax 1 capful daily. She can also try otc IB gard for bloating. If symptoms are not improved with the above interventions, can consider repeating colonoscopy sooner/possibly upper endoscopy as well.    PLAN:  Continue with daily probiotics  2. Increase water intake, aim for 64 oz/day 3. Miralax 1 capful per day 4. otc IB gard 5. Consider EGD/colonoscopy prior to October if symptoms not improving  6. Continue with omeprazole 20mg  daily, avoid eating late   All questions  were answered, patient verbalized understanding and is in agreement with plan as outlined above.   Follow Up: 6 weeks   Adleigh Mcmasters L. Jeanmarie Hubert, MSN, APRN, AGNP-C Adult-Gerontology Nurse Practitioner [facility] . I have reviewed the note and agree with the APP's assessment as described in this progress note  Katrinka Blazing, MD Gastroenterology and Hepatology [facility]

## 2023-02-05 NOTE — Patient Instructions (Signed)
Continue with daily probiotics  Increase water intake-aim for 64 oz day  Start Miralax 1 capful per day (this is over the counter in a white and purple bottle) You can try over the counter IB gard for bloating/gas  Continue with omeprazole 20mg  daily, avoid eating late  Will plan for colonoscopy in October unless symptoms persist, at which time we may consider proceeding with Colonoscopy +EGD sooner  Follow up 6 weeks

## 2023-02-23 ENCOUNTER — Other Ambulatory Visit: Payer: Self-pay | Admitting: Adult Health

## 2023-02-26 ENCOUNTER — Encounter: Payer: Self-pay | Admitting: Cardiology

## 2023-02-26 ENCOUNTER — Ambulatory Visit: Payer: Medicare HMO | Attending: Cardiology | Admitting: Cardiology

## 2023-02-26 VITALS — BP 132/66 | HR 65 | Ht 66.5 in | Wt 145.0 lb

## 2023-02-26 DIAGNOSIS — I10 Essential (primary) hypertension: Secondary | ICD-10-CM

## 2023-02-26 DIAGNOSIS — E782 Mixed hyperlipidemia: Secondary | ICD-10-CM

## 2023-02-26 DIAGNOSIS — I471 Supraventricular tachycardia, unspecified: Secondary | ICD-10-CM

## 2023-02-26 NOTE — Progress Notes (Addendum)
Clinical Summary Ms. Villescas is a 68 y.o.female seen today for follow up of the following medical problems.    1. PSVT - history is unclear from available notes. Notes mention PSVT documented on event recorder however do not see monitor report in our system EKGs reviewed, show normal sinus rhythm          - reported weight gain on toprol 25mg , lowered to 12.5mg  daily. Reports some nonspecific fatigue at times. We tried changing to diltiazem but high bp's and did not feel well on, back on toprol - reports infrequent palpitations.     01/2022 monitor: rare ectopy - started atenolol 12.5mg  daily, has done very well since starting.   - one episode of palpitations since last visit, overall doing well.    2. HTN - often stress/anxiety at doctors office, bp's tend to run higher here then at home. Last visit her f/u home bp's had been at goal despite being elevated in clinic - she is compliant with meds. Home bp's 130s/70s     3. Hyperlipidmeia - compliant with meds - 06/2021 TC 170 TG 113 HDL 82 LDL 86 -07/2022 TC 540 TG 981 HDL 55 LDL 83. Reports had not been fasting, likely why TGs elevated 10/2022 TC 145 TG 94 HDL 53 LDL 74     SH: recently stopped working at [employer], in between jobs Past Medical History:  Diagnosis Date   Abnormal Pap smear    Abnormal Papanicolaou smear of cervix with positive human papilloma virus (HPV) test 04/06/2018   Pap was LSIL with +HPV, will need colpo____   Anxiety    Back pain 03/07/2014   Cervical spine disease    Mild   Chest pain    Elevated cholesterol    Ganglion cyst of left foot 12/06/2013   H/O bilateral breast implants 03/03/2013   Hematuria 03/28/2014   Hot flashes 12/06/2013   Hypertension    Menopausal symptoms 12/06/2013   Mental disorder    anxiety   Migraine with visual aura    Osteopenia after menopause 10/06/2018   DEXA 09/28/2018, osteopenia, take calcium vitamin D and stay active    PSVT (paroxysmal supraventricular tachycardia)     Documented by event recorder   Rectocele 03/03/2013   RUQ pain 03/07/2014   Vaginal discharge 02/24/2014   Vaginal dryness, menopausal 12/06/2013   Vaginal Pap smear, abnormal    Yeast infection 02/24/2014     Allergies  Allergen Reactions   Fish Allergy Anaphylaxis and Swelling    Throat closes.   Bactrim Ds [Sulfamethoxazole-Trimethoprim]     Hives    Ciprocinonide [Fluocinolone] Hives    " made me feel awful"    Codeine Other (See Comments)    DROPS PT. BLOOD PRESSURE   Penicillins     Has patient had a PCN reaction causing immediate rash, facial/tongue/throat swelling, SOB or lightheadedness with hypotension:unsure Has patient had a PCN reaction causing severe rash involving mucus membranes or skin necrosis:unsure Has patient had a PCN reaction that required hospitalization:No Has patient had a PCN reaction occurring within the last 10 years:No If all of the above answers are "NO", then may proceed with Cephalosporin use. Childhood reaction    Sulfa Antibiotics Hives, Diarrhea, Nausea Only and Rash     Current Outpatient Medications  Medication Sig Dispense Refill   acetaminophen (TYLENOL) 500 MG tablet Take 500 mg by mouth every 6 (six) hours as needed (for pain).      ALPRAZolam Prudy Feeler)  1 MG tablet TAKE 1 TABLET BY MOUTH TWICE DAILY AS NEEDED FOR ANXIETY 40 tablet 0   amLODipine (NORVASC) 5 MG tablet Take 5 mg by mouth daily.     atenolol (TENORMIN) 25 MG tablet Take 0.5 tablets (12.5 mg total) by mouth daily. May take an additional 12.5 mg daily as needed for palpitations 90 tablet 3   dicyclomine (BENTYL) 10 MG capsule Take 1 capsule (10 mg total) by mouth 3 (three) times daily as needed for spasms. 90 capsule 1   estradiol (ESTRACE) 0.1 MG/GM vaginal cream INSERT ONE APPLICATORFUL VAGINALLY THREE TIMES A WEEK 42.5 g 3   fluticasone (FLONASE) 50 MCG/ACT nasal spray Place 1 spray into both nostrils daily.     levocetirizine (XYZAL) 5 MG tablet Take 5 mg by mouth daily. In  the morning per the patient.     omeprazole (PRILOSEC) 20 MG capsule TAKE ONE CAPSULE BY MOUTH ONCE DAILY 90 capsule 0   Rimegepant Sulfate (NURTEC) 75 MG TBDP Take 75 mg by mouth daily as needed. Dispense 10 or max allowed by insurance. Triptans contraindicated due to retinal migraines (Patient not taking: Reported on 02/05/2023) 10 tablet 6   rosuvastatin (CRESTOR) 10 MG tablet TAKE ONE TABLET BY MOUTH ONCE DAILY 90 tablet 3   terconazole (TERAZOL 7) 0.4 % vaginal cream Place 1 applicator vaginally at bedtime. (Patient not taking: Reported on 08/19/2022) 45 g 3   No current facility-administered medications for this visit.     Past Surgical History:  Procedure Laterality Date   BIOPSY N/A 12/16/2019   Procedure: BIOPSY;  Surgeon: Malissa Hippo, MD;  [facility];  Service: Endoscopy;  Laterality: N/A;   BREAST ENHANCEMENT SURGERY     COLONOSCOPY N/A 06/25/2016   Procedure: COLONOSCOPY;  Surgeon: Malissa Hippo, MD;  [facility];  Service: Endoscopy;  Laterality: N/A;  1:00-moved to 1230 Ann to notify pt   ESOPHAGOGASTRODUODENOSCOPY (EGD) WITH PROPOFOL N/A 12/16/2019   Procedure: ESOPHAGOGASTRODUODENOSCOPY (EGD) WITH PROPOFOL;  Surgeon: Malissa Hippo, MD;  [facility];  Service: Endoscopy;  Laterality: N/A;  830   POLYPECTOMY  06/25/2016   Procedure: POLYPECTOMY;  Surgeon: Malissa Hippo, MD;  [facility];  Service: Endoscopy;;  colon    toe biopsy       Allergies  Allergen Reactions   Fish Allergy Anaphylaxis and Swelling    Throat closes.   Bactrim Ds [Sulfamethoxazole-Trimethoprim]     Hives    Ciprocinonide [Fluocinolone] Hives    " made me feel awful"    Codeine Other (See Comments)    DROPS PT. BLOOD PRESSURE   Penicillins     Has patient had a PCN reaction causing immediate rash, facial/tongue/throat swelling, SOB or lightheadedness with hypotension:unsure Has patient had a PCN reaction causing severe rash involving  mucus membranes or skin necrosis:unsure Has patient had a PCN reaction that required hospitalization:No Has patient had a PCN reaction occurring within the last 10 years:No If all of the above answers are "NO", then may proceed with Cephalosporin use. Childhood reaction    Sulfa Antibiotics Hives, Diarrhea, Nausea Only and Rash      Family History  Problem Relation Age of Onset   Dementia Father    Cancer Mother    Migraines Mother    Healthy Daughter    Healthy Daughter    Healthy Daughter    Cancer Maternal Grandmother    Heart disease Maternal Grandmother    Diabetes Maternal  Grandfather    Heart attack Paternal Grandmother      Social History Ms. Blacksher reports that she has never smoked. She has never been exposed to tobacco smoke. She has never used smokeless tobacco. Ms. Swaminathan reports no history of alcohol use.   Review of Systems CONSTITUTIONAL: No weight loss, fever, chills, weakness or fatigue.  HEENT: Eyes: No visual loss, blurred vision, double vision or yellow sclerae.No hearing loss, sneezing, congestion, runny nose or sore throat.  SKIN: No rash or itching.  CARDIOVASCULAR: per hpi RESPIRATORY: No shortness of breath, cough or sputum.  GASTROINTESTINAL: No anorexia, nausea, vomiting or diarrhea. No abdominal pain or blood.  GENITOURINARY: No burning on urination, no polyuria NEUROLOGICAL: No headache, dizziness, syncope, paralysis, ataxia, numbness or tingling in the extremities. No change in bowel or bladder control.  MUSCULOSKELETAL: No muscle, back pain, joint pain or stiffness.  LYMPHATICS: No enlarged nodes. No history of splenectomy.  PSYCHIATRIC: No history of depression or anxiety.  ENDOCRINOLOGIC: No reports of sweating, cold or heat intolerance. No polyuria or polydipsia.  Marland Kitchen   Physical Examination Today's Vitals   02/26/23 1451  BP: 132/66  Pulse: 65  SpO2: 95%  Weight: 145 lb (65.8 kg)  Height: 5' 6.5" (1.689 m)   Body mass index is  23.05 kg/m.  Gen: resting comfortably, no acute distress HEENT: no scleral icterus, pupils equal round and reactive, no palptable cervical adenopathy,  CV: RRR, no m/rg, no jvd Resp: Clear to auscultation bilaterally GI: abdomen is soft, non-tender, non-distended, normal bowel sounds, no hepatosplenomegaly MSK: extremities are warm, no edema.  Skin: warm, no rash Neuro:  no focal deficits Psych: appropriate affect   Diagnostic Studies  10/2020 monitor 24 hr monitor Patch Wear Time:  1 days and 1 hours (2022-04-20T15:06:18-0400 to 2022-04-21T16:51:52-0400)   Patient had a min HR of 50 bpm, max HR of 126 bpm, and avg HR of 65 bpm. Predominant underlying rhythm was Sinus Rhythm. Isolated SVEs were rare (<1.0%), and no SVE Couplets or SVE Triplets were present. Isolated VEs were rare (<1.0%), and no VE Couplets  or VE Triplets were present.    01/2022 monitor 6 day monitor Rare supraventricualr ectopy in the form of isolated PVCs, couplets Rare ventricular ectopy in the form of isolated PVCs Patient triggered events without reported symptoms No significant arrhythmias     Patch Wear Time:  5 days and 19 hours (2023-06-08T11:54:31-0400 to 2023-06-14T07:47:12-398)   Patient had a min HR of 52 bpm, max HR of 146 bpm, and avg HR of 75 bpm. Predominant underlying rhythm was Sinus Rhythm. Slight P wave morphology changes were noted. Isolated SVEs were rare (<1.0%), SVE Couplets were rare (<1.0%), and no SVE Triplets  were present. Isolated VEs were rare (<1.0%), and no VE Couplets or VE Triplets were present.   Assessment and Plan   1. PSVT - long history of palpitations  - recent monitor with just benign ectopy - did not tolerate toprol or dilt,has done well on low dose atenolol - conitnue current meds - EKG today shows NSR   2. HTN -at goal, continue current meds   3. Hyperlipidemia - at goal, prior TGs were elevated but was not fasting for labs. Repeat shows TGs are  normal - continue current meds   F/u 6 months. If doing well then likely once a year   Antoine Poche, M.D.

## 2023-02-26 NOTE — Patient Instructions (Signed)
Medication Instructions:  Continue all current medications.   Labwork: none  Testing/Procedures: none  Follow-Up: 6 months   Any Other Special Instructions Will Be Listed Below (If Applicable).   If you need a refill on your cardiac medications before your next appointment, please call your pharmacy.  

## 2023-02-27 DIAGNOSIS — Z131 Encounter for screening for diabetes mellitus: Secondary | ICD-10-CM | POA: Diagnosis not present

## 2023-03-19 ENCOUNTER — Ambulatory Visit (INDEPENDENT_AMBULATORY_CARE_PROVIDER_SITE_OTHER): Payer: Medicare HMO | Admitting: Gastroenterology

## 2023-03-25 DIAGNOSIS — D485 Neoplasm of uncertain behavior of skin: Secondary | ICD-10-CM | POA: Diagnosis not present

## 2023-03-25 DIAGNOSIS — Z1283 Encounter for screening for malignant neoplasm of skin: Secondary | ICD-10-CM | POA: Diagnosis not present

## 2023-03-25 DIAGNOSIS — D1801 Hemangioma of skin and subcutaneous tissue: Secondary | ICD-10-CM | POA: Diagnosis not present

## 2023-03-25 DIAGNOSIS — L570 Actinic keratosis: Secondary | ICD-10-CM | POA: Diagnosis not present

## 2023-03-30 ENCOUNTER — Other Ambulatory Visit: Payer: Self-pay | Admitting: Adult Health

## 2023-04-23 DIAGNOSIS — J309 Allergic rhinitis, unspecified: Secondary | ICD-10-CM | POA: Diagnosis not present

## 2023-04-23 DIAGNOSIS — G43911 Migraine, unspecified, intractable, with status migrainosus: Secondary | ICD-10-CM | POA: Diagnosis not present

## 2023-04-23 DIAGNOSIS — K581 Irritable bowel syndrome with constipation: Secondary | ICD-10-CM | POA: Diagnosis not present

## 2023-04-23 DIAGNOSIS — I10 Essential (primary) hypertension: Secondary | ICD-10-CM | POA: Diagnosis not present

## 2023-04-23 DIAGNOSIS — Z6822 Body mass index (BMI) 22.0-22.9, adult: Secondary | ICD-10-CM | POA: Diagnosis not present

## 2023-04-23 DIAGNOSIS — M5459 Other low back pain: Secondary | ICD-10-CM | POA: Diagnosis not present

## 2023-04-25 ENCOUNTER — Other Ambulatory Visit: Payer: Self-pay | Admitting: Adult Health

## 2023-04-27 ENCOUNTER — Ambulatory Visit (INDEPENDENT_AMBULATORY_CARE_PROVIDER_SITE_OTHER): Payer: Medicare HMO | Admitting: Adult Health

## 2023-04-27 ENCOUNTER — Encounter: Payer: Self-pay | Admitting: Adult Health

## 2023-04-27 ENCOUNTER — Other Ambulatory Visit: Payer: Self-pay | Admitting: Adult Health

## 2023-04-27 VITALS — BP 127/80 | HR 75 | Ht 67.0 in | Wt 145.0 lb

## 2023-04-27 DIAGNOSIS — T8549XA Other mechanical complication of breast prosthesis and implant, initial encounter: Secondary | ICD-10-CM | POA: Diagnosis not present

## 2023-04-27 DIAGNOSIS — Z9882 Breast implant status: Secondary | ICD-10-CM

## 2023-04-27 DIAGNOSIS — R140 Abdominal distension (gaseous): Secondary | ICD-10-CM

## 2023-04-27 DIAGNOSIS — R109 Unspecified abdominal pain: Secondary | ICD-10-CM | POA: Diagnosis not present

## 2023-04-27 NOTE — Progress Notes (Signed)
  Subjective:     [name], female   DOB: 28-Dec-1955, 67 y.o.   MRN: [id number]  HPI [name] is a 67 year old white female, widowed, PM in complaining, of implants not as firm or as high, and stomach hurts, feels bloated, sees GI, was given bentyl but did not take, made her feel tired.      Component Value Date/Time   DIAGPAP - Low grade squamous intraepithelial lesion (LSIL) (A) 06/27/2022 0933   DIAGPAP - Low grade squamous intraepithelial lesion (LSIL) (A) 06/24/2021 1116   DIAGPAP - Low grade squamous intraepithelial lesion (LSIL) (A) 05/09/2020 1058   HPVHIGH Positive (A) 06/27/2022 0933   HPVHIGH Positive (A) 06/24/2021 1116   HPVHIGH Positive (A) 05/09/2020 1058   ADEQPAP  06/27/2022 0933    Satisfactory for evaluation; transformation zone component PRESENT.   ADEQPAP  06/24/2021 1116    Satisfactory for evaluation; transformation zone component PRESENT.   ADEQPAP  05/09/2020 1058    Satisfactory for evaluation; transformation zone component PRESENT.    PCP is Dr Olena Leatherwood  Review of Systems Change in breast implants, not as firm or as high +stomach hurts, feels bloated +weight gain Reviewed past medical,surgical, social and family history. Reviewed medications and allergies.     Objective:   Physical Exam BP 127/80 (BP Location: Left Arm, Patient Position: Sitting, Cuff Size: Normal)   Pulse 75   Ht 5\' 7"  (1.702 m)   Wt 145 lb (65.8 kg)   BMI 22.71 kg/m    Skin warm and dry,  Breasts:no dominate palpable mass, retraction or nipple discharge, implants are not as firm or full and in left ?felt movement Abdomen is soft, more discomfort below navel, no mass felt Fall risk is low  Upstream - 04/27/23 1507       Pregnancy Intention Screening   Does the patient want to become pregnant in the next year? N/A    Does the patient's partner want to become pregnant in the next year? N/A    Would the patient like to discuss contraceptive options today? N/A       Contraception Wrap Up   Current Method No Method - Other Reason   postmenopausal   Reason for No Current Contraceptive Method at Intake ([facility] Only) Other    End Method No Method - Other Reason   postmenopausal   Contraception Counseling Provided No                Assessment:     1. H/O bilateral breast implants Has retropectal implants Had normal mammogram 10/17/20  2. Breast implant deflation, initial encounter Not has firm or has high, feels like movement in left implant Did refer to general surgery but they do not see implants, will refer to plastic surgeon Dr Foster Simpson  Left message at [facility] where she gets mammogram if needs [place], or diagnostic mammogram, and [place], she says she does not feel comfortable with mammogram now.  3. Stomach pain +stomach pain, did not take bentyl [place] scheduled for her 05/01/23 at 12:30 pm at [facility] to assess  - US PELVIC COMPLETE WITH TRANSVAGINAL; Future  4. Bloated abdomen US scheduled for [facility]01/[facility] at 12:30 pm to assess bloating  - US PELVIC COMPLETE WITH TRANSVAGINAL; Future     Plan:     Follow up prn

## 2023-04-29 DIAGNOSIS — N644 Mastodynia: Secondary | ICD-10-CM | POA: Diagnosis not present

## 2023-04-29 DIAGNOSIS — R92323 Mammographic fibroglandular density, bilateral breasts: Secondary | ICD-10-CM | POA: Diagnosis not present

## 2023-04-29 DIAGNOSIS — T8549XA Other mechanical complication of breast prosthesis and implant, initial encounter: Secondary | ICD-10-CM | POA: Diagnosis not present

## 2023-05-01 ENCOUNTER — Ambulatory Visit (HOSPITAL_COMMUNITY): Payer: Medicare HMO

## 2023-05-01 ENCOUNTER — Telehealth: Payer: Self-pay | Admitting: Adult Health

## 2023-05-01 ENCOUNTER — Ambulatory Visit (HOSPITAL_COMMUNITY)
Admission: RE | Admit: 2023-05-01 | Discharge: 2023-05-01 | Disposition: A | Discharge status: Home / Self Care | Payer: Medicare HMO | Source: Ambulatory Visit | Attending: Adult Health | Admitting: Adult Health

## 2023-05-01 DIAGNOSIS — R140 Abdominal distension (gaseous): Secondary | ICD-10-CM | POA: Diagnosis not present

## 2023-05-01 DIAGNOSIS — R109 Unspecified abdominal pain: Secondary | ICD-10-CM | POA: Diagnosis not present

## 2023-05-01 NOTE — Telephone Encounter (Signed)
Patient calling stating that she has questions, surgery dr. Janae Sauce states that the Surgery is not scheduled til Oct 1st , wants to know if it is okay to wait that long due to her breast ruptured. Patient would like for you to call her about the surgery being that far out. She said if you would please call the [facility]

## 2023-05-01 NOTE — Telephone Encounter (Signed)
Pt had mammogram showed both implants ruptured. She is going to see Dr Ulice Bold.

## 2023-05-11 ENCOUNTER — Telehealth: Payer: Self-pay

## 2023-05-11 DIAGNOSIS — R109 Unspecified abdominal pain: Secondary | ICD-10-CM | POA: Diagnosis not present

## 2023-05-11 NOTE — Telephone Encounter (Signed)
Patient would like for April Knox to call her.

## 2023-05-11 NOTE — Telephone Encounter (Signed)
Patient called and stated that [name] needs to call [phone] opt 2 and then opt 4. Claim # [id number] and let the people know that she has had 2 breast ruptures. Pt stated that she told them but, they say the have to hear it from her provider.

## 2023-05-12 ENCOUNTER — Ambulatory Visit (INDEPENDENT_AMBULATORY_CARE_PROVIDER_SITE_OTHER): Payer: Self-pay | Admitting: Plastic Surgery

## 2023-05-12 ENCOUNTER — Telehealth: Payer: Self-pay | Admitting: Plastic Surgery

## 2023-05-12 VITALS — BP 143/80 | HR 61

## 2023-05-12 DIAGNOSIS — T8549XA Other mechanical complication of breast prosthesis and implant, initial encounter: Secondary | ICD-10-CM

## 2023-05-12 DIAGNOSIS — Z719 Counseling, unspecified: Secondary | ICD-10-CM

## 2023-05-12 DIAGNOSIS — T8543XA Leakage of breast prosthesis and implant, initial encounter: Secondary | ICD-10-CM | POA: Insufficient documentation

## 2023-05-12 NOTE — Telephone Encounter (Signed)
Pt aware needs to have plastic surgeon call about implants

## 2023-05-12 NOTE — Progress Notes (Signed)
[name], female    DOB: August 21, 1956, 67 y.o.   MRN: [id number]   Chief Complaint  Patient presents with   Advice Only    The patient is a 67 year old female here for evaluation of her breasts.  The patient had implants placed in 2018 by Dr. Shon Hough.  The implants are 425 cc saline implants.  The right side has 400 cc in it.  The left side has 425 cc and it.  The patient had of them placed through an areolar incision.  The patient had a mammogram in August which showed rupture of both implants.  The mammogram was otherwise negative.  She is not a smoker and does not have diabetes.  She is not on blood thinner.  She would like to have the implants replaced.  Likely with saline due to the warrantee.  She is about an A cup now.  She would like to be around the same size.  Interestingly the patient's 2 daughters had the same procedure around the same time with the same surgeon and both have had a rupture of one of the implants.    Review of Systems  Constitutional: Negative.   HENT: Negative.    Eyes: Negative.   Respiratory: Negative.  Negative for chest tightness and shortness of breath.   Cardiovascular: Negative.   Gastrointestinal: Negative.   Endocrine: Negative.   Genitourinary: Negative.   Musculoskeletal: Negative.   Hematological: Negative.     Past Medical History:  Diagnosis Date   Abnormal Pap smear    Abnormal Papanicolaou smear of cervix with positive human papilloma virus (HPV) test 04/06/2018   Pap was LSIL with +HPV, will need colpo____   Anxiety    Back pain 03/07/2014   Cervical spine disease    Mild   Chest pain    Elevated cholesterol    Ganglion cyst of left foot 12/06/2013   H/O bilateral breast implants 03/03/2013   Hematuria 03/28/2014   Hot flashes 12/06/2013   Hypertension    Menopausal symptoms 12/06/2013   Mental disorder    anxiety   Migraine with visual aura    Osteopenia after menopause 10/06/2018   DEXA 09/28/2018, osteopenia, take  calcium vitamin D and stay active    PSVT (paroxysmal supraventricular tachycardia)    Documented by event recorder   Rectocele 03/03/2013   RUQ pain 03/07/2014   Vaginal discharge 02/24/2014   Vaginal dryness, menopausal 12/06/2013   Vaginal Pap smear, abnormal    Yeast infection 02/24/2014    Past Surgical History:  Procedure Laterality Date   BIOPSY N/A 12/16/2019   Procedure: BIOPSY;  Surgeon: Malissa Hippo, MD;  [facility];  Service: Endoscopy;  Laterality: N/A;   BREAST ENHANCEMENT SURGERY     COLONOSCOPY N/A 06/25/2016   Procedure: COLONOSCOPY;  Surgeon: Malissa Hippo, MD;  [facility]: [facility];  Service: Endoscopy;  Laterality: N/A;  1:00-moved to 1230 Ann to notify pt   ESOPHAGOGASTRODUODENOSCOPY (EGD) WITH PROPOFOL N/A 12/16/2019   Procedure: ESOPHAGOGASTRODUODENOSCOPY (EGD) WITH PROPOFOL;  Surgeon: Malissa Hippo, MD;  [facility];  Service: Endoscopy;  Laterality: N/A;  830   POLYPECTOMY  06/25/2016   Procedure: POLYPECTOMY;  Surgeon: Malissa Hippo, MD;  [facility];  Service: Endoscopy;;  colon    toe biopsy        Current Outpatient Medications:    acetaminophen (TYLENOL) 500 MG tablet, Take 500 mg by mouth every 6 (  six) hours as needed (for pain). , Disp: , Rfl:    ALPRAZolam (XANAX) 1 MG tablet, TAKE 1 TABLET BY MOUTH TWICE DAILY AS NEEDED FOR ANXIETY, Disp: 40 tablet, Rfl: 0   amLODipine (NORVASC) 5 MG tablet, Take 5 mg by mouth daily., Disp: , Rfl:    atenolol (TENORMIN) 25 MG tablet, Take 0.5 tablets (12.5 mg total) by mouth daily. May take an additional 12.5 mg daily as needed for palpitations, Disp: 90 tablet, Rfl: 3   dicyclomine (BENTYL) 10 MG capsule, Take 1 capsule (10 mg total) by mouth 3 (three) times daily as needed for spasms., Disp: 90 capsule, Rfl: 1   estradiol (ESTRACE) 0.1 MG/GM vaginal cream, INSERT ONE APPLICATORFUL VAGINALLY THREE TIMES A WEEK, Disp: 42.5 g, Rfl: 3   fluticasone (FLONASE) 50 MCG/ACT nasal  spray, Place 1 spray into both nostrils daily., Disp: , Rfl:    levocetirizine (XYZAL) 5 MG tablet, Take 5 mg by mouth daily. In the morning per the patient., Disp: , Rfl:    omeprazole (PRILOSEC) 20 MG capsule, TAKE ONE CAPSULE BY MOUTH ONCE DAILY, Disp: 90 capsule, Rfl: 0   Rimegepant Sulfate (NURTEC) 75 MG TBDP, Take 75 mg by mouth daily as needed. Dispense 10 or max allowed by insurance. Triptans contraindicated due to retinal migraines, Disp: 10 tablet, Rfl: 6   rosuvastatin (CRESTOR) 10 MG tablet, TAKE ONE TABLET BY MOUTH ONCE DAILY, Disp: 90 tablet, Rfl: 3   Objective:   Vitals:   05/12/23 1007 05/12/23 1043  BP: (!) 157/93 (!) 143/80  Pulse: 69 61  SpO2: 100%     Physical Exam Vitals and nursing note reviewed.  Constitutional:      Appearance: Normal appearance.  HENT:     Head: Atraumatic.  Cardiovascular:     Rate and Rhythm: Normal rate.     Pulses: Normal pulses.  Pulmonary:     Effort: Pulmonary effort is normal.  Abdominal:     Palpations: Abdomen is soft.  Musculoskeletal:        General: No swelling or deformity.  Skin:    General: Skin is warm.     Capillary Refill: Capillary refill takes less than 2 seconds.     Coloration: Skin is not jaundiced.     Findings: No bruising.  Neurological:     Mental Status: She is alert and oriented to person, place, and time.  Psychiatric:        Mood and Affect: Mood normal.        Behavior: Behavior normal.        Thought Content: Thought content normal.        Judgment: Judgment normal.     Assessment & Plan:  Breast implant deflation, initial encounter  Rupture of implant of left breast, initial encounter  Rupture of implant of right breast, initial encounter  We will contact [employer] to see what the options are for replacement through the warrantee.  The patient would like to go with the saline implants.  Bilateral removal of ruptured implants and placement of saline implants is the plan.  Pictures were  obtained of the patient and placed in the chart with the patient's or guardian's permission.   Alena Bills Kemarion Abbey, DO

## 2023-05-12 NOTE — Telephone Encounter (Signed)
Patient called and stated that she forgot to mention at her appt earlier today that the [employer] dept needs to be contacted to notify them of her 2 implant ruptures.  Her claim # is [id number].  The [employer] dept # is [phone]

## 2023-05-17 DIAGNOSIS — R1010 Upper abdominal pain, unspecified: Secondary | ICD-10-CM | POA: Diagnosis not present

## 2023-05-17 DIAGNOSIS — R9431 Abnormal electrocardiogram [ECG] [EKG]: Secondary | ICD-10-CM | POA: Diagnosis not present

## 2023-05-17 DIAGNOSIS — K219 Gastro-esophageal reflux disease without esophagitis: Secondary | ICD-10-CM | POA: Diagnosis not present

## 2023-05-17 DIAGNOSIS — K2970 Gastritis, unspecified, without bleeding: Secondary | ICD-10-CM | POA: Diagnosis not present

## 2023-05-17 DIAGNOSIS — R001 Bradycardia, unspecified: Secondary | ICD-10-CM | POA: Diagnosis not present

## 2023-05-17 DIAGNOSIS — I445 Left posterior fascicular block: Secondary | ICD-10-CM | POA: Diagnosis not present

## 2023-05-25 ENCOUNTER — Other Ambulatory Visit: Payer: Self-pay | Admitting: Adult Health

## 2023-05-25 DIAGNOSIS — Z6822 Body mass index (BMI) 22.0-22.9, adult: Secondary | ICD-10-CM | POA: Diagnosis not present

## 2023-05-25 DIAGNOSIS — K219 Gastro-esophageal reflux disease without esophagitis: Secondary | ICD-10-CM | POA: Diagnosis not present

## 2023-05-27 ENCOUNTER — Encounter (INDEPENDENT_AMBULATORY_CARE_PROVIDER_SITE_OTHER): Payer: Self-pay | Admitting: *Deleted

## 2023-06-02 ENCOUNTER — Institutional Professional Consult (permissible substitution): Payer: Medicare HMO | Admitting: Plastic Surgery

## 2023-06-10 ENCOUNTER — Other Ambulatory Visit: Payer: Self-pay | Admitting: Adult Health

## 2023-06-15 ENCOUNTER — Telehealth: Payer: Self-pay | Admitting: Adult Health

## 2023-06-15 NOTE — Telephone Encounter (Signed)
She is worried about breast implants, call surgeon back, she says she did not request early refill on xanax

## 2023-06-15 NOTE — Telephone Encounter (Signed)
Patient called in regards to her xanax states that the pharmacy told her that [name] denied her refill. Could you please check on this and call [name] back please. Thank you

## 2023-06-18 DIAGNOSIS — H524 Presbyopia: Secondary | ICD-10-CM | POA: Diagnosis not present

## 2023-06-18 DIAGNOSIS — H43393 Other vitreous opacities, bilateral: Secondary | ICD-10-CM | POA: Diagnosis not present

## 2023-06-23 ENCOUNTER — Other Ambulatory Visit: Payer: Self-pay | Admitting: Adult Health

## 2023-06-29 ENCOUNTER — Ambulatory Visit: Payer: Medicare HMO | Admitting: Adult Health

## 2023-06-29 ENCOUNTER — Other Ambulatory Visit (HOSPITAL_COMMUNITY)
Admission: RE | Admit: 2023-06-29 | Discharge: 2023-06-29 | Disposition: A | Discharge status: Home / Self Care | Payer: Medicare HMO | Source: Ambulatory Visit | Attending: Adult Health | Admitting: Adult Health

## 2023-06-29 ENCOUNTER — Encounter: Payer: Self-pay | Admitting: Adult Health

## 2023-06-29 VITALS — BP 156/72 | HR 95 | Ht 66.0 in | Wt 146.0 lb

## 2023-06-29 DIAGNOSIS — Z01419 Encounter for gynecological examination (general) (routine) without abnormal findings: Secondary | ICD-10-CM

## 2023-06-29 DIAGNOSIS — Z1151 Encounter for screening for human papillomavirus (HPV): Secondary | ICD-10-CM | POA: Diagnosis not present

## 2023-06-29 DIAGNOSIS — Z1211 Encounter for screening for malignant neoplasm of colon: Secondary | ICD-10-CM | POA: Diagnosis not present

## 2023-06-29 DIAGNOSIS — Z01411 Encounter for gynecological examination (general) (routine) with abnormal findings: Secondary | ICD-10-CM | POA: Insufficient documentation

## 2023-06-29 DIAGNOSIS — T8549XD Other mechanical complication of breast prosthesis and implant, subsequent encounter: Secondary | ICD-10-CM | POA: Diagnosis not present

## 2023-06-29 DIAGNOSIS — R87810 Cervical high risk human papillomavirus (HPV) DNA test positive: Secondary | ICD-10-CM | POA: Insufficient documentation

## 2023-06-29 DIAGNOSIS — I10 Essential (primary) hypertension: Secondary | ICD-10-CM | POA: Diagnosis not present

## 2023-06-29 DIAGNOSIS — Z1339 Encounter for screening examination for other mental health and behavioral disorders: Secondary | ICD-10-CM

## 2023-06-29 DIAGNOSIS — R87610 Atypical squamous cells of undetermined significance on cytologic smear of cervix (ASC-US): Secondary | ICD-10-CM | POA: Diagnosis not present

## 2023-06-29 DIAGNOSIS — H6123 Impacted cerumen, bilateral: Secondary | ICD-10-CM | POA: Diagnosis not present

## 2023-06-29 DIAGNOSIS — Z8742 Personal history of other diseases of the female genital tract: Secondary | ICD-10-CM | POA: Diagnosis not present

## 2023-06-29 DIAGNOSIS — Z6823 Body mass index (BMI) 23.0-23.9, adult: Secondary | ICD-10-CM | POA: Diagnosis not present

## 2023-06-29 LAB — HEMOCCULT GUIAC POC 1CARD (OFFICE): Fecal Occult Blood, POC: NEGATIVE

## 2023-06-29 NOTE — Progress Notes (Signed)
[name], female   DOB: 04-Apr-1956, 67 y.o.   MRN: [id number] History of Present Illness: April Knox is a 67 year old white female, widowed, PM  in for a well woman gyn exam and pap.  Last pap was LSIL, +HPV 06/27/22, had colpo 07/10/22. Has appt with plastic surgeon 07/27/23 to remove deflated implants and replace. She says right ear is popping, and can't hear good.   PCP is Dr Olena Leatherwood.    Current Medications, Allergies, Past Medical History, Past Surgical History, Family History and Social History were reviewed in [employer] record.     Review of Systems: Patient denies any headaches, hearing loss, fatigue, blurred vision, shortness of breath, chest pain, abdominal pain, problems with bowel movements, urination, or intercourse. No joint pain or mood swings.  See HPI for positives.   Physical Exam:BP (!) 156/72 (BP Location: Right Arm, Patient Position: Sitting, Cuff Size: Normal)   Pulse 95   Ht 5\' 6"  (1.676 m)   Wt 146 lb (66.2 kg)   BMI 23.57 kg/m   General:  Well developed, well nourished, no acute distress Skin:  Warm and dry Neck:  Midline trachea, normal thyroid, good ROM, no lymphadenopathy,no carotid bruits heard, has bilateral ear wax. Lungs; Clear to auscultation bilaterally Breast:  No dominant palpable mass, retraction, or nipple discharge, implant deflation bilaterally  Cardiovascular: Regular rate and rhythm Abdomen:  Soft, non tender, no hepatosplenomegaly Pelvic:  External genitalia is normal in appearance, no lesions.  The vagina is pale but moist. Urethra has no lesions or masses. The cervix is smooth, pap with GC/CHL and HR HPV genotyping performed.  Uterus is felt to be normal size, shape, and contour.  No adnexal masses or tenderness noted.Bladder is non tender, no masses felt. Rectal: Good sphincter tone, no polyps, or hemorrhoids felt.  Hemoccult negative. Extremities/musculoskeletal:  No swelling or varicosities noted, no  clubbing or cyanosis Psych:  No mood changes, alert and cooperative,seems happy AA is 0 Fall risk is low    06/29/2023    8:42 AM 06/27/2022    9:29 AM 06/24/2021   11:39 AM  Depression screen PHQ 2/9  Decreased Interest 0 0 0  Down, Depressed, Hopeless 0 0 0  PHQ - 2 Score 0 0 0  Altered sleeping 0 0   Tired, decreased energy 0 0   Change in appetite 0 0   Feeling bad or failure about yourself  0 0   Trouble concentrating 0 0   Moving slowly or fidgety/restless 0 0   Suicidal thoughts 0 0   PHQ-9 Score 0 0        06/29/2023    8:42 AM 06/27/2022    9:29 AM 06/24/2021   12:20 PM 05/09/2020   11:03 AM  GAD 7 : Generalized Anxiety Score  Nervous, Anxious, on Edge 0 0 1 0  Control/stop worrying 0 0 1 0  Worry too much - different things 0 0 1 0  Trouble relaxing 0 0 1 0  Restless 0 0 0 0  Easily annoyed or irritable 0 0 0 0  Afraid - awful might happen 0 0 0 0  Total GAD 7 Score 0 0 4 0  Anxiety Difficulty   Not difficult at all Not difficult at all    Upstream - 06/29/23 0840       Pregnancy Intention Screening   Does the patient want to become pregnant in the next year? N/A    Does the  patient's partner want to become pregnant in the next year? N/A    Would the patient like to discuss contraceptive options today? N/A      Contraception Wrap Up   Current Method No Method - Other Reason   PM   Reason for No Current Contraceptive Method at Intake ([facility] Only) Other    End Method No Method - Other Reason   PM   Contraception Counseling Provided No            Examination chaperoned by [place] Scearce, NP student.   Impression and Plan: 1. Encounter for gynecological examination with Papanicolaou smear of cervix Pap sent Pap in 3 years if normal Physical in 1 years Mammogram 04/29/23 showed ruptured implants - Cytology - PAP( [facility])  2. Encounter for screening fecal occult blood testing Hemoccult was negative   3. History of abnormal cervical Pap  smear Pap sent  4. Breast implant deflation, subsequent encounter To see plastic surgeon 07/27/23 for repalcement   5. Bilateral impacted cerumen Try debrox, no Q tips  6. Hypertension, unspecified type Keep check on BP and take meds Follow up with PCP

## 2023-06-30 ENCOUNTER — Telehealth (INDEPENDENT_AMBULATORY_CARE_PROVIDER_SITE_OTHER): Payer: Self-pay | Admitting: Gastroenterology

## 2023-06-30 DIAGNOSIS — Z860100 Personal history of colon polyps, unspecified: Secondary | ICD-10-CM

## 2023-06-30 NOTE — Telephone Encounter (Signed)
Who is your primary care physician: Dr.Hasanaj  Reasons for the colonoscopy: recall  Have you had a colonoscopy before?  Yes 5 years ago  Do you have family history of colon cancer? No but my mom died at 42 with cancer  Previous colonoscopy with polyps removed? Yes 5 years ago  Do you have a history colorectal cancer?   no  Are you diabetic? If yes, Type 1 or Type 2?    no  Do you have a prosthetic or mechanical heart valve? no  Do you have a pacemaker/defibrillator?   no  Have you had endocarditis/atrial fibrillation? No I have MVP and tachycardia  Have you had joint replacement within the last 12 months?  no  Do you tend to be constipated or have to use laxatives? no  Do you have any history of drugs or alchohol?  no  Do you use supplemental oxygen?  no  Have you had a stroke or heart attack within the last 6 months? no  Do you take weight loss medication?  no  For female patients: have you had a hysterectomy?  no                                     are you post menopausal?       no                                            do you still have your menstrual cycle? no      Do you take any blood-thinning medications such as: (aspirin, warfarin, Plavix, Aggrenox)  no  If yes we need the name, milligram, dosage and who is prescribing doctor  Current Outpatient Medications on File Prior to Visit  Medication Sig Dispense Refill   acetaminophen (TYLENOL) 500 MG tablet Take 500 mg by mouth every 6 (six) hours as needed (for pain).      ALPRAZolam (XANAX) 1 MG tablet TAKE 1 TABLET BY MOUTH TWICE DAILY AS NEEDED FOR ANXIETY 40 tablet 0   amLODipine (NORVASC) 5 MG tablet Take 5 mg by mouth daily.     atenolol (TENORMIN) 25 MG tablet Take 0.5 tablets (12.5 mg total) by mouth daily. May take an additional 12.5 mg daily as needed for palpitations 90 tablet 3   dicyclomine (BENTYL) 10 MG capsule Take 1 capsule (10 mg total) by mouth 3 (three) times daily as needed for spasms. 90  capsule 1   estradiol (ESTRACE) 0.1 MG/GM vaginal cream INSERT ONE APPLICATORFUL VAGINALLY THREE TIMES A WEEK 42.5 g 3   fluticasone (FLONASE) 50 MCG/ACT nasal spray Place 1 spray into both nostrils daily.     levocetirizine (XYZAL) 5 MG tablet Take 5 mg by mouth daily. In the morning per the patient.     omeprazole (PRILOSEC) 20 MG capsule TAKE ONE CAPSULE BY MOUTH ONCE DAILY 90 capsule 0   Rimegepant Sulfate (NURTEC) 75 MG TBDP Take 75 mg by mouth daily as needed. Dispense 10 or max allowed by insurance. Triptans contraindicated due to retinal migraines 10 tablet 6   rosuvastatin (CRESTOR) 10 MG tablet TAKE ONE TABLET BY MOUTH ONCE DAILY 90 tablet 3   No current facility-administered medications on file prior to visit.    Allergies  Allergen Reactions   Fish Allergy Anaphylaxis and  Swelling    Throat closes.   Bactrim Ds [Sulfamethoxazole-Trimethoprim]     Hives    Ciprocinonide [Fluocinolone] Hives    " made me feel awful"    Codeine Other (See Comments)    DROPS PT. BLOOD PRESSURE   Penicillins     Has patient had a PCN reaction causing immediate rash, facial/tongue/throat swelling, SOB or lightheadedness with hypotension:unsure Has patient had a PCN reaction causing severe rash involving mucus membranes or skin necrosis:unsure Has patient had a PCN reaction that required hospitalization:No Has patient had a PCN reaction occurring within the last 10 years:No If all of the above answers are "NO", then may proceed with Cephalosporin use. Childhood reaction    Sulfa Antibiotics Hives, Diarrhea, Nausea Only and Rash     Pharmacy: [facility]  Primary Insurance Name: [facility]  Best number where you can be reached: [phone]

## 2023-06-30 NOTE — Telephone Encounter (Signed)
Room 1 Thanks

## 2023-07-01 LAB — CYTOLOGY - PAP
Chlamydia: NEGATIVE
Comment: NEGATIVE
Comment: NEGATIVE
Comment: NEGATIVE
Comment: NEGATIVE
Comment: NORMAL
HPV 16: POSITIVE — AB
HPV 18 / 45: NEGATIVE
High risk HPV: POSITIVE — AB
Neisseria Gonorrhea: NEGATIVE

## 2023-07-01 NOTE — Telephone Encounter (Signed)
Left message to return call 

## 2023-07-02 ENCOUNTER — Encounter: Payer: Self-pay | Admitting: Adult Health

## 2023-07-02 ENCOUNTER — Other Ambulatory Visit (HOSPITAL_COMMUNITY)
Admission: RE | Admit: 2023-07-02 | Discharge: 2023-07-02 | Disposition: A | Discharge status: Home / Self Care | Payer: Medicare HMO | Source: Ambulatory Visit | Attending: Obstetrics & Gynecology | Admitting: Obstetrics & Gynecology

## 2023-07-02 ENCOUNTER — Encounter: Payer: Self-pay | Admitting: Obstetrics & Gynecology

## 2023-07-02 ENCOUNTER — Telehealth: Payer: Self-pay | Admitting: Adult Health

## 2023-07-02 ENCOUNTER — Ambulatory Visit (INDEPENDENT_AMBULATORY_CARE_PROVIDER_SITE_OTHER): Payer: Medicare HMO | Admitting: Obstetrics & Gynecology

## 2023-07-02 VITALS — BP 136/79 | HR 68 | Ht 67.0 in | Wt 147.0 lb

## 2023-07-02 DIAGNOSIS — R87810 Cervical high risk human papillomavirus (HPV) DNA test positive: Secondary | ICD-10-CM | POA: Diagnosis not present

## 2023-07-02 DIAGNOSIS — R87612 Low grade squamous intraepithelial lesion on cytologic smear of cervix (LGSIL): Secondary | ICD-10-CM

## 2023-07-02 DIAGNOSIS — N870 Mild cervical dysplasia: Secondary | ICD-10-CM | POA: Diagnosis not present

## 2023-07-02 MED ORDER — PEG 3350-KCL-NA BICARB-NACL 420 G PO SOLR: 4000.0000 mL | Freq: Once | ORAL | 0 refills | Status: AC

## 2023-07-02 MED ORDER — PEG 3350-KCL-NA BICARB-NACL 420 G PO SOLR: 4000.0000 mL | Freq: Once | ORAL | 0 refills | Status: DC

## 2023-07-02 NOTE — Addendum Note (Signed)
Addended by: Caralyn Guile on: 07/02/2023 03:36 PM   Modules accepted: Orders

## 2023-07-02 NOTE — Addendum Note (Signed)
Addended by: Marlowe Shores on: 07/02/2023 01:37 PM   Modules accepted: Orders

## 2023-07-02 NOTE — Telephone Encounter (Signed)
Pt left message returning call. Returned call to patient. Pt scheduled for 09/01/23. Pt is having surgery on 07/27/23 and would like to wait until a little while after that. Prep sent to pharmacy. Instructions will be mailed once telephone pre op appt is received.  PA approved via [employer]

## 2023-07-02 NOTE — Progress Notes (Signed)
Patient name: [name] MRN [id number]  Date of birth: [birth year] Chief Complaint:   Colposcopy  History of Present Illness:   April Knox is a 67 y.o. G3P3 PM female being seen today for cervical dysplasia management.  Smoker:  No. Same partner for the past 5 yrs  Denies vaginal bleeding, discharge, itching or irritation.  Denies pelvic or abdominal pain.  No other acute complaints   Prior cytology: LSIL, HPV16+ Prior pap 06/2022- LSIL, HPV 16+, colposcopy done- no biopsies  No LMP recorded. Patient is postmenopausal.     06/29/2023    8:42 AM 06/27/2022    9:29 AM 06/24/2021   11:39 AM 05/09/2020   11:03 AM 04/01/2018    9:38 AM  Depression screen PHQ 2/9  Decreased Interest 0 0 0 2 0  Down, Depressed, Hopeless 0 0 0 1 0  PHQ - 2 Score 0 0 0 3 0  Altered sleeping 0 0  1 0  Tired, decreased energy 0 0  1 0  Change in appetite 0 0  1 0  Feeling bad or failure about yourself  0 0  2 0  Trouble concentrating 0 0  0 0  Moving slowly or fidgety/restless 0 0  0 0  Suicidal thoughts 0 0  0 0  PHQ-9 Score 0 0  8 0  Difficult doing work/chores    Not difficult at all Not difficult at all     Review of Systems:   Pertinent items are noted in HPI Denies fever/chills, dizziness, headaches, visual disturbances, fatigue, shortness of breath, chest pain, abdominal pain, vomiting, denies problems with bowel movements, urination, or intercourse unless otherwise stated above.  Pertinent History Reviewed:  Reviewed past medical,surgical, social, obstetrical and family history.  Reviewed problem list, medications and allergies. Physical Assessment:   Vitals:   07/02/23 1500 07/02/23 1507  BP: (!) 149/79 136/79  Pulse: 69 68  Weight: 147 lb (66.7 kg)   Height: 5\' 7"  (1.702 m)   Body mass index is 23.02 kg/m.       Physical Examination:   General appearance: alert, well appearing, and in no distress  Psych: mood appropriate, normal affect  Skin: warm & dry    Cardiovascular: normal heart rate noted  Respiratory: normal respiratory effort, no distress  Abdomen: soft, non-tender   Pelvic: VULVA: normal appearing vulva with no masses, tenderness or lesions, VAGINA: normal appearing vagina with normal color and discharge, no lesions, CERVIX: see colposcopy section  Extremities: no edema   Chaperone:  Freddie Apley      Colposcopy Procedure Note  Indications: LSIL, HPV 16 +   Procedure Details  The risks and benefits of the procedure and Written informed consent obtained.  Speculum placed in vagina and excellent visualization of cervix achieved, cervix swabbed x 3 with acetic acid solution.  Findings: Adequate colposcopy is noted today.  TMZ zone present  Cervix: no visible lesions; acetowhite changes noted, most noticeable at 4 o'clock.  ECC and cervical biopsies obtained.    Monsel's applied.  Adequate hemostasis noted  Specimens: ECC and cervical biopsies  Complications: none.  Colposcopic Impression: CIN 1   Plan(Based on 2019 ASCCP recommendations)  -Discussed HPV- reviewed incidence and its potential to cause condylomas to dysplasia to cervical cancer -Reviewed degree of abnormal pap smears  -Discussed ASCCP guidelines and current recommendations for colposcopy -As above, inform consent obtained and procedure completed -biopsies obtained, further management pending results -Questions and concerns were addressed  [name]  Charlotta Newton, DO Attending Obstetrician & Gynecologist, [facility] for [employer], [facility]

## 2023-07-02 NOTE — Telephone Encounter (Signed)
Pt aware of pap and the need for colpo, will get appt scheduled.

## 2023-07-06 LAB — SURGICAL PATHOLOGY

## 2023-07-09 NOTE — Progress Notes (Signed)
[name], female    DOB: 07/08/1956, 67 y.o.   MRN: [id number]  Chief Complaint  Patient presents with   Pre-op Exam      ICD-10-CM   1. Breast implant rupture, subsequent encounter  T85.43XD        History of Present Illness: April Knox is a 67 y.o.  female  with a history of bilateral breast augmentation.  She presents for preoperative evaluation for upcoming procedure, bilateral breast implant removal and replacement, scheduled for 07/27/2023 with Dr. Ulice Bold.  The patient has not had problems with anesthesia.  Previous surgeries without complication.  She denies any personal history of cancer, severe cardiac or pulmonary disease, or use of anticoagulants.  She does have a history of varicosities.  Denies any personal or family history of blood clots or clotting disorder.  Her friend Clydie Braun will be driving her to and from surgery and assist with her postoperative recovery for the first 24 hours.  Patient also has 3 daughters.  She tells me that she believes her implants were placed under the muscle.  She is admittedly an anxious person at baseline.  Discussed expectations for surgery as well as risks of implant placement.  Patient is agreeable to proceed.  Completed the [employer] authorization form for the claims department.  Return Box is also here in the office.  Summary of Previous Visit: Patient met with Dr. Ulice Bold for initial consult 05/12/2023.  At that time, discussed history of saline implant breast augmentation in 2018. In her surgical history in chart I see 2014.  She reported 400/425 cc saline implant on the right and 425/425 cc saline implant on the left (prior to her bilateral rupture).  She would like to proceed with saline implants again and be approximately the same size.  Evidently they are trying to go through their [employer] claims department.  Claim number is [id number].    Job: Not currently employed and does not require any FMLA/STD.  PMH  Significant for: Bilateral breast augmentation with subsequent ruptured implants, anxiety, GERD, HTN, HLD.   Past Medical History: Allergies: Allergies  Allergen Reactions   Fish Allergy Anaphylaxis and Swelling    Throat closes.   Bactrim Ds [Sulfamethoxazole-Trimethoprim]     Hives    Ciprocinonide [Fluocinolone] Hives    " made me feel awful"    Codeine Other (See Comments)    DROPS PT. BLOOD PRESSURE   Penicillins     Has patient had a PCN reaction causing immediate rash, facial/tongue/throat swelling, SOB or lightheadedness with hypotension:unsure Has patient had a PCN reaction causing severe rash involving mucus membranes or skin necrosis:unsure Has patient had a PCN reaction that required hospitalization:No Has patient had a PCN reaction occurring within the last 10 years:No If all of the above answers are "NO", then may proceed with Cephalosporin use. Childhood reaction    Sulfa Antibiotics Hives, Diarrhea, Nausea Only and Rash    Current Medications:  Current Outpatient Medications:    amLODipine (NORVASC) 5 MG tablet, Take 5 mg by mouth daily., Disp: , Rfl:    atenolol (TENORMIN) 25 MG tablet, Take 0.5 tablets (12.5 mg total) by mouth daily. May take an additional 12.5 mg daily as needed for palpitations, Disp: 90 tablet, Rfl: 3   omeprazole (PRILOSEC) 20 MG capsule, TAKE ONE CAPSULE BY MOUTH ONCE DAILY, Disp: 90 capsule, Rfl: 0   rosuvastatin (CRESTOR) 10 MG tablet, TAKE ONE TABLET BY MOUTH ONCE DAILY, Disp: 90 tablet, Rfl:  3   acetaminophen (TYLENOL) 500 MG tablet, Take 500 mg by mouth every 6 (six) hours as needed (for pain).  (Patient not taking: Reported on 07/10/2023), Disp: , Rfl:    ALPRAZolam (XANAX) 1 MG tablet, TAKE 1 TABLET BY MOUTH TWICE DAILY AS NEEDED FOR ANXIETY (Patient not taking: Reported on 07/10/2023), Disp: 40 tablet, Rfl: 0   dicyclomine (BENTYL) 10 MG capsule, Take 1 capsule (10 mg total) by mouth 3 (three) times daily as needed for spasms.  (Patient not taking: Reported on 07/02/2023), Disp: 90 capsule, Rfl: 1   estradiol (ESTRACE) 0.1 MG/GM vaginal cream, INSERT ONE APPLICATORFUL VAGINALLY THREE TIMES A WEEK (Patient not taking: Reported on 07/10/2023), Disp: 42.5 g, Rfl: 3   fluticasone (FLONASE) 50 MCG/ACT nasal spray, Place 1 spray into both nostrils daily. (Patient not taking: Reported on 07/10/2023), Disp: , Rfl:    levocetirizine (XYZAL) 5 MG tablet, Take 5 mg by mouth daily. In the morning per the patient. (Patient not taking: Reported on 07/10/2023), Disp: , Rfl:    Rimegepant Sulfate (NURTEC) 75 MG TBDP, Take 75 mg by mouth daily as needed. Dispense 10 or max allowed by insurance. Triptans contraindicated due to retinal migraines (Patient not taking: Reported on 07/02/2023), Disp: 10 tablet, Rfl: 6  Past Medical Problems: Past Medical History:  Diagnosis Date   Abnormal Pap smear    Abnormal Papanicolaou smear of cervix with positive human papilloma virus (HPV) test 04/06/2018   Pap was LSIL with +HPV, will need colpo____   Anxiety    Back pain 03/07/2014   Cervical spine disease    Mild   Chest pain    Elevated cholesterol    Ganglion cyst of left foot 12/06/2013   H/O bilateral breast implants 03/03/2013   Hematuria 03/28/2014   Hot flashes 12/06/2013   Hypertension    Menopausal symptoms 12/06/2013   Mental disorder    anxiety   Migraine with visual aura    Osteopenia after menopause 10/06/2018   DEXA 09/28/2018, osteopenia, take calcium vitamin D and stay active    PSVT (paroxysmal supraventricular tachycardia) (HCC)    Documented by event recorder   Rectocele 03/03/2013   RUQ pain 03/07/2014   Vaginal discharge 02/24/2014   Vaginal dryness, menopausal 12/06/2013   Vaginal Pap smear, abnormal    Yeast infection 02/24/2014    Past Surgical History: Past Surgical History:  Procedure Laterality Date   BIOPSY N/A 12/16/2019   Procedure: BIOPSY;  Surgeon: Malissa Hippo, MD;  [facility];  Service: Endoscopy;   Laterality: N/A;   BREAST ENHANCEMENT SURGERY     COLONOSCOPY N/A 06/25/2016   Procedure: COLONOSCOPY;  Surgeon: Malissa Hippo, MD;  [facility]: [facility];  Service: Endoscopy;  Laterality: N/A;  1:00-moved to 1230 Ann to notify pt   ESOPHAGOGASTRODUODENOSCOPY (EGD) WITH PROPOFOL N/A 12/16/2019   Procedure: ESOPHAGOGASTRODUODENOSCOPY (EGD) WITH PROPOFOL;  Surgeon: Malissa Hippo, MD;  [facility];  Service: Endoscopy;  Laterality: N/A;  830   POLYPECTOMY  06/25/2016   Procedure: POLYPECTOMY;  Surgeon: Malissa Hippo, MD;  [facility];  Service: Endoscopy;;  colon    toe biopsy      Social History: Social History   Socioeconomic History   Marital status: Widowed    Spouse name: Not on file   Number of children: 3   Years of education: Not on file   Highest education level: Not on file  Occupational History   Occupation: Unemployed  Tobacco Use   Smoking status: Never    Passive exposure: Never   Smokeless tobacco: Never  Vaping Use   Vaping status: Never Used  Substance and Sexual Activity   Alcohol use: No    Alcohol/week: 0.0 standard drinks of alcohol   Drug use: No   Sexual activity: Yes    Birth control/protection: Post-menopausal  Other Topics Concern   Not on file  Social History Narrative   Lives alone   Right handed   Caffeine: none   Social Determinants of Health   Financial Resource Strain: Medium Risk (06/29/2023)   Overall Financial Resource Strain (CARDIA)    Difficulty of Paying Living Expenses: Somewhat hard  Food Insecurity: No Food Insecurity (06/29/2023)   Hunger Vital Sign    Worried About Running Out of Food in the Last Year: Never true    Ran Out of Food in the Last Year: Never true  Transportation Needs: No Transportation Needs (06/29/2023)   PRAPARE - Administrator, Civil Service (Medical): No    Lack of Transportation (Non-Medical): No  Physical Activity: Insufficiently Active (06/29/2023)    Exercise Vital Sign    Days of Exercise per Week: 2 days    Minutes of Exercise per Session: 10 min  Stress: No Stress Concern Present (06/29/2023)   [employer] of Occupational Health - Occupational Stress Questionnaire    Feeling of Stress : Only a little  Social Connections: Socially Isolated (06/29/2023)   Social Connection and Isolation Panel [NHANES]    Frequency of Communication with Friends and Family: Once a week    Frequency of Social Gatherings with Friends and Family: Twice a week    Attends Religious Services: Never    Database administrator or Organizations: No    Attends Banker Meetings: Never    Marital Status: Widowed  Intimate Partner Violence: Not At Risk (06/29/2023)   Humiliation, Afraid, Rape, and Kick questionnaire    Fear of Current or Ex-Partner: No    Emotionally Abused: No    Physically Abused: No    Sexually Abused: No    Family History: Family History  Problem Relation Age of Onset   Dementia Father    Cancer Mother    Migraines Mother    Healthy Daughter    Healthy Daughter    Healthy Daughter    Cancer Maternal Grandmother    Heart disease Maternal Grandmother    Diabetes Maternal Grandfather    Heart attack Paternal Grandmother     Review of Systems: ROS Denies any recent chest pain, fevers, difficulty breathing, or leg swelling.  Physical Exam: Vital Signs BP (!) 154/82 (BP Location: Left Arm, Patient Position: Sitting, Cuff Size: Normal)   Pulse 70   Ht 5\' 7"  (1.702 m)   Wt 145 lb 9.6 oz (66 kg)   SpO2 97%   BMI 22.80 kg/m   Physical Exam Constitutional:      General: Not in acute distress.    Appearance: Normal appearance. Not ill-appearing.  HENT:     Head: Normocephalic and atraumatic.  Eyes:     Pupils: Pupils are equal, round. Cardiovascular:     Rate and Rhythm: Normal rate.    Pulses: Normal pulses.  Pulmonary:     Effort: No respiratory distress or increased work of breathing.  Speaks in full  sentences. Abdominal:     General: Abdomen is flat. No distension.   Musculoskeletal: Normal range of motion. No lower extremity swelling or  edema.  Scattered varicosities. Skin:    General: Skin is warm and dry.     Findings: No erythema or rash.  Neurological:     Mental Status: Alert and oriented to person, place, and time.  Psychiatric:        Mood and Affect: Mood normal.        Behavior: Behavior normal.    Assessment/Plan: The patient is scheduled for bilateral breast implant removal and replacement with Dr. Ulice Bold.  Risks, benefits, and alternatives of procedure discussed, questions answered and consent obtained.    Smoking Status: Non-smoker. Last Mammogram: 04/2023; Results: BI-RADS Category 2: Benign.  Caprini Score: 5; Risk Factors include: Age, scattered varicosities, and length of planned surgery. Recommendation for mechanical prophylaxis. Encourage early ambulation.   Pictures obtained: 05/12/2023  Post-op Rx sent to pharmacy: Clindamycin, Zofran, oxycodone (#10).  Patient was provided with the General Surgical Risk consent document and Pain Medication Agreement prior to their appointment.  They had adequate time to read through the risk consent documents and Pain Medication Agreement. We also discussed them in person together during this preop appointment. All of their questions were answered to their satisfaction.  Recommended calling if they have any further questions.  Risk consent form and Pain Medication Agreement to be scanned into patient's chart.  The risks that can be encountered with and after placement of a breast implant placement were discussed and include the following but not limited to these: bleeding, infection, delayed healing, anesthesia risks, skin sensation changes, injury to structures including nerves, blood vessels, and muscles which may be temporary or permanent, allergies to tape, suture materials and glues, blood products, topical preparations  or injected agents, skin contour irregularities, skin discoloration and swelling, deep vein thrombosis, cardiac and pulmonary complications, pain, which may persist, fluid accumulation, wrinkling of the skin over the implant, changes in nipple or breast sensation, implant leakage or rupture, faulty position of the implant, persistent pain, formation of tight scar tissue around the implant (capsular contracture), possible need for revisional surgery or staged procedures.    Electronically signed by: Evelena Leyden, PA-C 07/10/2023 9:47 AM

## 2023-07-10 ENCOUNTER — Encounter: Payer: Self-pay | Admitting: Physician Assistant

## 2023-07-10 ENCOUNTER — Ambulatory Visit (INDEPENDENT_AMBULATORY_CARE_PROVIDER_SITE_OTHER): Payer: Medicare HMO | Admitting: Physician Assistant

## 2023-07-10 VITALS — BP 154/82 | HR 70 | Ht 67.0 in | Wt 145.6 lb

## 2023-07-10 DIAGNOSIS — T8543XD Leakage of breast prosthesis and implant, subsequent encounter: Secondary | ICD-10-CM

## 2023-07-10 MED ORDER — CLINDAMYCIN HCL 150 MG PO CAPS: 450.0000 mg | ORAL_CAPSULE | Freq: Three times a day (TID) | ORAL | 0 refills | Status: AC

## 2023-07-10 MED ORDER — OXYCODONE HCL 5 MG PO TABS: 5.0000 mg | ORAL_TABLET | Freq: Three times a day (TID) | ORAL | 0 refills | Status: AC | PRN

## 2023-07-10 MED ORDER — ONDANSETRON 4 MG PO TBDP: 4.0000 mg | ORAL_TABLET | Freq: Three times a day (TID) | ORAL | 0 refills | Status: DC | PRN

## 2023-07-14 ENCOUNTER — Encounter (INDEPENDENT_AMBULATORY_CARE_PROVIDER_SITE_OTHER): Payer: Self-pay

## 2023-07-14 ENCOUNTER — Telehealth (INDEPENDENT_AMBULATORY_CARE_PROVIDER_SITE_OTHER): Payer: Self-pay | Admitting: Gastroenterology

## 2023-07-14 NOTE — Telephone Encounter (Signed)
Cancellation on 07/21/23-contacted pt and she is now scheduled for 07/21/23 at 12:15pm. Pt would like instructions mailed to her. Informed pt Im not sure if mail would get to her in time but pt insisted she would get them later this week. Will call pt with pre op appt.

## 2023-07-14 NOTE — Telephone Encounter (Signed)
Pt has cancelled TCS for 09/01/23 at 7:30 with Dr.Castaneda. She may reschedule if we have a cancellation for next Tuesday;otherwise, per pt, she will need to wait until next year.

## 2023-07-17 ENCOUNTER — Other Ambulatory Visit: Payer: Self-pay

## 2023-07-17 ENCOUNTER — Encounter (HOSPITAL_COMMUNITY)
Admission: RE | Admit: 2023-07-17 | Discharge: 2023-07-17 | Disposition: A | Discharge status: Home / Self Care | Payer: Medicare HMO | Source: Ambulatory Visit | Attending: Gastroenterology | Admitting: Gastroenterology

## 2023-07-17 ENCOUNTER — Encounter (HOSPITAL_COMMUNITY): Payer: Self-pay

## 2023-07-17 HISTORY — DX: Gastro-esophageal reflux disease without esophagitis: K21.9

## 2023-07-21 ENCOUNTER — Ambulatory Visit (HOSPITAL_BASED_OUTPATIENT_CLINIC_OR_DEPARTMENT_OTHER): Payer: Medicare HMO | Admitting: Anesthesiology

## 2023-07-21 ENCOUNTER — Ambulatory Visit (HOSPITAL_COMMUNITY): Payer: Medicare HMO | Admitting: Anesthesiology

## 2023-07-21 ENCOUNTER — Encounter (HOSPITAL_COMMUNITY)
Admission: RE | Disposition: A | Discharge status: Home / Self Care | Payer: Self-pay | Source: Home / Self Care | Attending: Gastroenterology

## 2023-07-21 ENCOUNTER — Ambulatory Visit (HOSPITAL_COMMUNITY)
Admission: RE | Admit: 2023-07-21 | Discharge: 2023-07-21 | Disposition: A | Discharge status: Home / Self Care | Payer: Medicare HMO | Attending: Gastroenterology | Admitting: Gastroenterology

## 2023-07-21 DIAGNOSIS — K649 Unspecified hemorrhoids: Secondary | ICD-10-CM

## 2023-07-21 DIAGNOSIS — F419 Anxiety disorder, unspecified: Secondary | ICD-10-CM | POA: Diagnosis not present

## 2023-07-21 DIAGNOSIS — E7800 Pure hypercholesterolemia, unspecified: Secondary | ICD-10-CM | POA: Diagnosis not present

## 2023-07-21 DIAGNOSIS — Z1211 Encounter for screening for malignant neoplasm of colon: Secondary | ICD-10-CM | POA: Insufficient documentation

## 2023-07-21 DIAGNOSIS — K5730 Diverticulosis of large intestine without perforation or abscess without bleeding: Secondary | ICD-10-CM | POA: Insufficient documentation

## 2023-07-21 DIAGNOSIS — K648 Other hemorrhoids: Secondary | ICD-10-CM | POA: Diagnosis not present

## 2023-07-21 DIAGNOSIS — Z860101 Personal history of adenomatous and serrated colon polyps: Secondary | ICD-10-CM | POA: Diagnosis not present

## 2023-07-21 DIAGNOSIS — K219 Gastro-esophageal reflux disease without esophagitis: Secondary | ICD-10-CM | POA: Diagnosis not present

## 2023-07-21 DIAGNOSIS — I10 Essential (primary) hypertension: Secondary | ICD-10-CM | POA: Diagnosis not present

## 2023-07-21 HISTORY — PX: COLONOSCOPY WITH PROPOFOL: SHX5780

## 2023-07-21 LAB — HM COLONOSCOPY

## 2023-07-21 SURGERY — COLONOSCOPY WITH PROPOFOL
Anesthesia: General

## 2023-07-21 MED ORDER — LACTATED RINGERS IV SOLN: INTRAVENOUS | Status: DC

## 2023-07-21 MED ORDER — LIDOCAINE HCL (CARDIAC) PF 100 MG/5ML IV SOSY
PREFILLED_SYRINGE | INTRAVENOUS | Status: DC | PRN
  Administered 2023-07-21: 50 mg via INTRAVENOUS

## 2023-07-21 MED ORDER — STERILE WATER FOR IRRIGATION IR SOLN
Status: DC | PRN
  Administered 2023-07-21: 120 mL

## 2023-07-21 MED ORDER — PROPOFOL 500 MG/50ML IV EMUL
INTRAVENOUS | Status: DC | PRN
  Administered 2023-07-21: 100 ug/kg/min via INTRAVENOUS

## 2023-07-21 MED ORDER — PROPOFOL 10 MG/ML IV BOLUS
INTRAVENOUS | Status: DC | PRN
  Administered 2023-07-21: 40 mg via INTRAVENOUS
  Administered 2023-07-21: 150 mg via INTRAVENOUS

## 2023-07-21 NOTE — Discharge Instructions (Signed)
We are waiting for your pathology results.  You are being discharged to home.  Resume your previous diet.  Your physician has recommended a repeat colonoscopy in 10 years for screening purposes.

## 2023-07-21 NOTE — Op Note (Signed)
[facility] Patient Name: April Knox Procedure Date: 07/21/2023 11:45 AM MRN: [id number] Date of Birth: Apr 16, 1956 Attending MD: Katrinka Blazing , , [id number] CSN: [id number] Age: 67 Admit Type: Outpatient Procedure:                Colonoscopy Indications:              Surveillance: Personal history of adenomatous                            polyps on last colonoscopy > 5 years ago Providers:                Katrinka Blazing, Edrick Kins, RN, Francoise Ceo RN,                            RN, Dyann Ruddle Referring MD:              Medicines:                Monitored Anesthesia Care Complications:            No immediate complications. Estimated Blood Loss:     Estimated blood loss: none. Procedure:                Pre-Anesthesia Assessment:                           - Prior to the procedure, a History and Physical                            was performed, and patient medications, allergies                            and sensitivities were reviewed. The patient's                            tolerance of previous anesthesia was reviewed.                           - The risks and benefits of the procedure and the                            sedation options and risks were discussed with the                            patient. All questions were answered and informed                            consent was obtained.                           - ASA Grade Assessment: II - A patient with mild                            systemic disease.                           After obtaining informed consent, the  colonoscope                            was passed under direct vision. Throughout the                            procedure, the patient's blood pressure, pulse, and                            oxygen saturations were monitored continuously. The                            [id number]-HQ190L ([id number]) scope was introduced through                            the anus and advanced to the the cecum,  identified                            by appendiceal orifice and ileocecal valve. The                            colonoscopy was performed without difficulty. The                            patient tolerated the procedure well. The quality                            of the bowel preparation was good. Scope In: 12:36:25 PM Scope Out: 12:53:02 PM Scope Withdrawal Time: 0 hours 10 minutes 57 seconds  Total Procedure Duration: 0 hours 16 minutes 37 seconds  Findings:      The perianal and digital rectal examinations were normal.      Scattered large-mouthed and small-mouthed diverticula were found in the       sigmoid colon, descending colon and ascending colon.      Non-bleeding internal hemorrhoids were found during retroflexion. The       hemorrhoids were small. Impression:               - Diverticulosis in the sigmoid colon, in the                            descending colon and in the ascending colon.                           - Non-bleeding internal hemorrhoids.                           - No specimens collected. Moderate Sedation:      Per Anesthesia Care Recommendation:           - Await pathology results.                           - Discharge patient to home (ambulatory).                           - Resume previous  diet.                           - Repeat colonoscopy in 10 years for screening                            purposes. Procedure Code(s):        --- Professional ---                           W4132, Colorectal cancer screening; colonoscopy on                            individual at high risk Diagnosis Code(s):        --- Professional ---                           Z86.010, Personal history of colonic polyps                           K64.8, Other hemorrhoids                           K57.30, Diverticulosis of large intestine without                            perforation or abscess without bleeding CPT copyright 2022 American Medical Association. All rights  reserved. The codes documented in this report are preliminary and upon coder review may  be revised to meet current compliance requirements. Katrinka Blazing, MD Katrinka Blazing,  07/21/2023 12:57:13 PM This report has been signed electronically. Number of Addenda: 0

## 2023-07-21 NOTE — Anesthesia Procedure Notes (Signed)
Date/Time: 07/21/2023 12:30 PM  Performed by: Franco Nones, CRNAPre-anesthesia Checklist: Patient identified, Emergency Drugs available, Suction available, Timeout performed and Patient being monitored Patient Re-evaluated:Patient Re-evaluated prior to induction Oxygen Delivery Method: Nasal Cannula

## 2023-07-21 NOTE — H&P (Addendum)
April Knox is an 67 y.o. female.   Chief Complaint: History of colon polyps HPI: 67 year old female with past medical history of anxiety, hyperlipidemia, hypertension, coming for evaluation of history of colon polyps.  Last colonoscopy was performed in 2017, had 1 tubular adenoma removed.  The patient denies having any complaints such as melena, hematochezia, abdominal pain or distention, change in her bowel movement consistency or frequency, no changes in weight recently.  No family history of colorectal cancer.   Past Medical History:  Diagnosis Date   Abnormal Pap smear    Abnormal Papanicolaou smear of cervix with positive human papilloma virus (HPV) test 04/06/2018   Pap was LSIL with +HPV, will need colpo____   Anxiety    Back pain 03/07/2014   Cervical spine disease    Mild   Chest pain    Elevated cholesterol    Ganglion cyst of left foot 12/06/2013   GERD (gastroesophageal reflux disease)    H/O bilateral breast implants 03/03/2013   Hematuria 03/28/2014   Hot flashes 12/06/2013   Hypertension    Menopausal symptoms 12/06/2013   Mental disorder    anxiety   Migraine with visual aura    Osteopenia after menopause 10/06/2018   DEXA 09/28/2018, osteopenia, take calcium vitamin D and stay active    PSVT (paroxysmal supraventricular tachycardia) (HCC)    Documented by event recorder   Rectocele 03/03/2013   RUQ pain 03/07/2014   Vaginal discharge 02/24/2014   Vaginal dryness, menopausal 12/06/2013   Vaginal Pap smear, abnormal    Yeast infection 02/24/2014    Past Surgical History:  Procedure Laterality Date   BIOPSY N/A 12/16/2019   Procedure: BIOPSY;  Surgeon: Malissa Hippo, MD;  [facility];  Service: Endoscopy;  Laterality: N/A;   BREAST ENHANCEMENT SURGERY     COLONOSCOPY N/A 06/25/2016   Procedure: COLONOSCOPY;  Surgeon: Malissa Hippo, MD;  [facility];  Service: Endoscopy;  Laterality: N/A;  1:00-moved to 1230 Ann to notify pt    ESOPHAGOGASTRODUODENOSCOPY (EGD) WITH PROPOFOL N/A 12/16/2019   Procedure: ESOPHAGOGASTRODUODENOSCOPY (EGD) WITH PROPOFOL;  Surgeon: Malissa Hippo, MD;  [facility];  Service: Endoscopy;  Laterality: N/A;  830   POLYPECTOMY  06/25/2016   Procedure: POLYPECTOMY;  Surgeon: Malissa Hippo, MD;  [facility];  Service: Endoscopy;;  colon    toe biopsy      Family History  Problem Relation Age of Onset   Dementia Father    Cancer Mother    Migraines Mother    Healthy Daughter    Healthy Daughter    Healthy Daughter    Cancer Maternal Grandmother    Heart disease Maternal Grandmother    Diabetes Maternal Grandfather    Heart attack Paternal Grandmother    Social History:  reports that she has never smoked. She has never been exposed to tobacco smoke. She has never used smokeless tobacco. She reports that she does not drink alcohol and does not use drugs.  Allergies:  Allergies  Allergen Reactions   Fish Allergy Anaphylaxis and Swelling    Throat closes.   Bactrim Ds [Sulfamethoxazole-Trimethoprim]     Hives    Ciprocinonide [Fluocinolone] Hives    " made me feel awful"    Codeine Other (See Comments)    DROPS PT. BLOOD PRESSURE   Penicillins     Has patient had a PCN reaction causing immediate rash, facial/tongue/throat swelling, SOB or lightheadedness with hypotension:unsure Has patient  had a PCN reaction causing severe rash involving mucus membranes or skin necrosis:unsure Has patient had a PCN reaction that required hospitalization:No Has patient had a PCN reaction occurring within the last 10 years:No If all of the above answers are "NO", then may proceed with Cephalosporin use. Childhood reaction    Sulfa Antibiotics Hives, Diarrhea, Nausea Only and Rash    Medications Prior to Admission  Medication Sig Dispense Refill   ALPRAZolam (XANAX) 1 MG tablet TAKE 1 TABLET BY MOUTH TWICE DAILY AS NEEDED FOR ANXIETY (Patient taking differently: Take  1 mg by mouth 2 (two) times daily as needed. for anxiety prn) 40 tablet 0   amLODipine (NORVASC) 5 MG tablet Take 5 mg by mouth daily.     atenolol (TENORMIN) 25 MG tablet Take 0.5 tablets (12.5 mg total) by mouth daily. May take an additional 12.5 mg daily as needed for palpitations 90 tablet 3   fluticasone (FLONASE) 50 MCG/ACT nasal spray Place 1 spray into both nostrils daily.     levocetirizine (XYZAL) 5 MG tablet Take 5 mg by mouth daily. In the morning per the patient.     omeprazole (PRILOSEC) 20 MG capsule TAKE ONE CAPSULE BY MOUTH ONCE DAILY 90 capsule 0   rosuvastatin (CRESTOR) 10 MG tablet TAKE ONE TABLET BY MOUTH ONCE DAILY 90 tablet 3   acetaminophen (TYLENOL) 500 MG tablet Take 500 mg by mouth every 6 (six) hours as needed (for pain).     dicyclomine (BENTYL) 10 MG capsule Take 1 capsule (10 mg total) by mouth 3 (three) times daily as needed for spasms. (Patient not taking: Reported on 07/02/2023) 90 capsule 1   estradiol (ESTRACE) 0.1 MG/GM vaginal cream INSERT ONE APPLICATORFUL VAGINALLY THREE TIMES A WEEK (Patient not taking: Reported on 07/10/2023) 42.5 g 3   ondansetron (ZOFRAN-ODT) 4 MG disintegrating tablet Take 1 tablet (4 mg total) by mouth every 8 (eight) hours as needed for nausea or vomiting. 20 tablet 0   Rimegepant Sulfate (NURTEC) 75 MG TBDP Take 75 mg by mouth daily as needed. Dispense 10 or max allowed by insurance. Triptans contraindicated due to retinal migraines (Patient not taking: Reported on 07/02/2023) 10 tablet 6    No results found for this or any previous visit (from the past 48 hour(s)). No results found.  Review of Systems  All other systems reviewed and are negative.   Blood pressure (!) 143/69, pulse 87, temperature 98 F (36.7 C), resp. rate 15, SpO2 100%. Physical Exam  GENERAL: The patient is AO x3, in no acute distress. HEENT: Head is normocephalic and atraumatic. EOMI are intact. Mouth is well hydrated and without lesions. NECK: Supple. No  masses LUNGS: Clear to auscultation. No presence of rhonchi/wheezing/rales. Adequate chest expansion HEART: RRR, normal s1 and s2. ABDOMEN: Soft, nontender, no guarding, no peritoneal signs, and nondistended. BS +. No masses. EXTREMITIES: Without any cyanosis, clubbing, rash, lesions or edema. NEUROLOGIC: AOx3, no focal motor deficit. SKIN: no jaundice, no rashes  Assessment/Plan 67 year old female with past medical history of anxiety, hyperlipidemia, hypertension, coming for evaluation of history of colon polyps.  Will proceed with colonoscopy.  Dolores Frame, MD 07/21/2023, 12:35 PM

## 2023-07-21 NOTE — Anesthesia Preprocedure Evaluation (Signed)
Anesthesia Evaluation  Patient identified by MRN, date of birth, ID band Patient awake    Reviewed: Allergy & Precautions, H&P , NPO status , Patient's Chart, lab work & pertinent test results, reviewed documented beta blocker date and time   Airway Mallampati: II  TM Distance: >3 FB Neck ROM: full    Dental no notable dental hx.    Pulmonary neg pulmonary ROS   Pulmonary exam normal breath sounds clear to auscultation       Cardiovascular Exercise Tolerance: Good hypertension, negative cardio ROS  Rhythm:regular Rate:Normal     Neuro/Psych  Headaches PSYCHIATRIC DISORDERS Anxiety     negative neurological ROS  negative psych ROS   GI/Hepatic negative GI ROS, Neg liver ROS,GERD  ,,  Endo/Other  negative endocrine ROS    Renal/GU negative Renal ROS  negative genitourinary   Musculoskeletal   Abdominal   Peds  Hematology negative hematology ROS (+)   Anesthesia Other Findings   Reproductive/Obstetrics negative OB ROS                             Anesthesia Physical Anesthesia Plan  ASA: 3  Anesthesia Plan: General   Post-op Pain Management:    Induction:   PONV Risk Score and Plan: Propofol infusion  Airway Management Planned:   Additional Equipment:   Intra-op Plan:   Post-operative Plan:   Informed Consent: I have reviewed the patients History and Physical, chart, labs and discussed the procedure including the risks, benefits and alternatives for the proposed anesthesia with the patient or authorized representative who has indicated his/her understanding and acceptance.     Dental Advisory Given  Plan Discussed with: CRNA  Anesthesia Plan Comments:        Anesthesia Quick Evaluation

## 2023-07-21 NOTE — Transfer of Care (Signed)
Immediate Anesthesia Transfer of Care Note  Patient: April Knox  Procedure(s) Performed: COLONOSCOPY WITH PROPOFOL  Patient Location: Short Stay  Anesthesia Type:General  Level of Consciousness: awake and patient cooperative  Airway & Oxygen Therapy: Patient Spontanous Breathing  Post-op Assessment: Report given to RN and Post -op Vital signs reviewed and stable  Post vital signs: Reviewed and stable  Last Vitals:  Vitals Value Taken Time  BP 116/67 07/21/23  1301  Temp 97.7 07/21/23  1301  Pulse 80 07/21/23  1301  Resp 10 07/21/23  1301  SpO2 100 07/21/23  1301    Last Pain:  Vitals:   07/21/23 1230  PainSc: 0-No pain         Complications: No notable events documented.

## 2023-07-22 ENCOUNTER — Encounter: Payer: Self-pay | Admitting: *Deleted

## 2023-07-22 ENCOUNTER — Telehealth (INDEPENDENT_AMBULATORY_CARE_PROVIDER_SITE_OTHER): Payer: Self-pay | Admitting: Gastroenterology

## 2023-07-22 NOTE — Telephone Encounter (Signed)
Pt called in and states that she had her TCS yesterday and  was going over her papers and saw she had diverticulitis. Pt is wanting to know if she needs to take medication or talk to a medical doctor about the diverticulitis. Pt uses [facility]. Please advise. Thank you.

## 2023-07-22 NOTE — Telephone Encounter (Signed)
Spoke with patient and clarified her questions, she has diverticulosis and had a CRNA administering her sedation.

## 2023-07-22 NOTE — Telephone Encounter (Signed)
Pt also that she is wanting to know where the anesthesiologist was when the lady injected the milky white stuff in her vein. I advised pt this was probably the CRNA but pt states she thought "the anesthesiologist does stuff like that because that lady doesn't know how to fast to put that through my vein or whatever". Pt states she is concerned about that.

## 2023-07-23 ENCOUNTER — Other Ambulatory Visit: Payer: Self-pay | Admitting: *Deleted

## 2023-07-23 MED ORDER — ALPRAZOLAM 1 MG PO TABS: 1.0000 mg | ORAL_TABLET | Freq: Two times a day (BID) | ORAL | 0 refills | Status: DC | PRN

## 2023-07-23 NOTE — Anesthesia Postprocedure Evaluation (Signed)
Anesthesia Post Note  Patient: April Knox  Procedure(s) Performed: COLONOSCOPY WITH PROPOFOL  Patient location during evaluation: Phase II Anesthesia Type: General Level of consciousness: awake Pain management: pain level controlled Vital Signs Assessment: post-procedure vital signs reviewed and stable Respiratory status: spontaneous breathing and respiratory function stable Cardiovascular status: blood pressure returned to baseline and stable Postop Assessment: no headache and no apparent nausea or vomiting Anesthetic complications: no Comments: Late entry   No notable events documented.   Last Vitals:  Vitals:   07/21/23 1145 07/21/23 1300  BP:  116/60  Pulse: 87 66  Resp: 15 14  Temp:  (!) 36.4 C  SpO2: 100% 100%    Last Pain:  Vitals:   07/22/23 1411  PainSc: 0-No pain                 Windell Norfolk

## 2023-07-27 ENCOUNTER — Encounter (HOSPITAL_COMMUNITY): Payer: Self-pay | Admitting: Gastroenterology

## 2023-07-27 DIAGNOSIS — T8541XA Breakdown (mechanical) of breast prosthesis and implant, initial encounter: Secondary | ICD-10-CM | POA: Diagnosis not present

## 2023-07-27 DIAGNOSIS — T8543XA Leakage of breast prosthesis and implant, initial encounter: Secondary | ICD-10-CM | POA: Diagnosis not present

## 2023-07-28 ENCOUNTER — Telehealth: Payer: Self-pay | Admitting: Adult Health

## 2023-07-28 NOTE — Telephone Encounter (Signed)
Pt is requesting a call from [place].

## 2023-07-29 ENCOUNTER — Ambulatory Visit: Payer: Medicare HMO | Admitting: Physician Assistant

## 2023-07-29 ENCOUNTER — Telehealth: Payer: Self-pay | Admitting: Plastic Surgery

## 2023-07-29 DIAGNOSIS — T8543XD Leakage of breast prosthesis and implant, subsequent encounter: Secondary | ICD-10-CM

## 2023-07-29 DIAGNOSIS — Z9889 Other specified postprocedural states: Secondary | ICD-10-CM

## 2023-07-29 NOTE — Telephone Encounter (Signed)
Patient need advice on after surgery care, bathing and changing bra. Please reach out to her

## 2023-07-29 NOTE — Progress Notes (Signed)
Patient is a pleasant 67 year old female with history of breast augmentation now s/p bilateral breast implant removal and replacement performed 07/27/2023 by Dr. Ulice Bold who joins via telephone for postoperative day 2 check-in.  She is at home a telephone conversation.  Her daughter was able to assist with her immediate postoperative period.  She tells me she has been controlling her pain symptoms with Tylenol alone and has not required any of the narcotics or antinausea medication.  She has continued to wear the compressive binder since time of surgery.  She inquires about showering.  She will hold off until tomorrow and continue with activity modifications and compressive garments until her appointment next week.  She states that she did have a low-grade fever to 65 F yesterday, but none today.  Informed patient that it is not uncommon for patient to develop postoperative fever in the first 48 hours and that it is not reflective of infection but rather bodily response to surgery.  She denies any leg swelling, chest pain, or difficulty breathing.  She is ambulatory.  She will call the office should she have any questions or concerns over the holiday weekend, otherwise plan for in person evaluation next week as scheduled.

## 2023-07-31 ENCOUNTER — Other Ambulatory Visit: Payer: Self-pay | Admitting: Physician Assistant

## 2023-08-03 ENCOUNTER — Encounter: Payer: Self-pay | Admitting: Physician Assistant

## 2023-08-03 ENCOUNTER — Ambulatory Visit (INDEPENDENT_AMBULATORY_CARE_PROVIDER_SITE_OTHER): Payer: Medicare HMO | Admitting: Physician Assistant

## 2023-08-03 VITALS — BP 149/81 | HR 80

## 2023-08-03 DIAGNOSIS — I10 Essential (primary) hypertension: Secondary | ICD-10-CM | POA: Diagnosis not present

## 2023-08-03 DIAGNOSIS — T8543XD Leakage of breast prosthesis and implant, subsequent encounter: Secondary | ICD-10-CM

## 2023-08-03 DIAGNOSIS — Z9889 Other specified postprocedural states: Secondary | ICD-10-CM

## 2023-08-03 DIAGNOSIS — Z0000 Encounter for general adult medical examination without abnormal findings: Secondary | ICD-10-CM | POA: Diagnosis not present

## 2023-08-03 DIAGNOSIS — K581 Irritable bowel syndrome with constipation: Secondary | ICD-10-CM | POA: Diagnosis not present

## 2023-08-03 DIAGNOSIS — Z6823 Body mass index (BMI) 23.0-23.9, adult: Secondary | ICD-10-CM | POA: Diagnosis not present

## 2023-08-03 DIAGNOSIS — M5459 Other low back pain: Secondary | ICD-10-CM | POA: Diagnosis not present

## 2023-08-03 DIAGNOSIS — G43911 Migraine, unspecified, intractable, with status migrainosus: Secondary | ICD-10-CM | POA: Diagnosis not present

## 2023-08-03 DIAGNOSIS — K219 Gastro-esophageal reflux disease without esophagitis: Secondary | ICD-10-CM | POA: Diagnosis not present

## 2023-08-03 DIAGNOSIS — J309 Allergic rhinitis, unspecified: Secondary | ICD-10-CM | POA: Diagnosis not present

## 2023-08-03 NOTE — Progress Notes (Signed)
Patient is a pleasant 67 year old female with history of breast augmentation now s/p bilateral breast implant removal and replacement performed 07/27/2023 by Dr. Ulice Bold who presents to clinic for postoperative follow-up.  Spoke with patient after her surgery and she was doing well.  Pain controlled with Tylenol alone, not requiring any narcotics or antinausea medication.  Low-grade fevers at 99 F, but in the first 36 hours postop.  Denied any concerning symptoms.  Recommended continued compressive garments and follow-up in office as scheduled.  Today, patient is doing well.  She states that she has not required any narcotic analgesics and is doing excellent from a pain standpoint.  Her only complaint is some mild itching between her breasts.  She denies any rash.  She is tolerating p.o. intake well, ambulatory.  Denies any leg swelling, chest pain, difficulty breathing, or fevers.  She is accompanied by her daughter at bedside.  She is quite pleased with the outcome of her surgery as well as her new size.  On exam, breasts of excellent shape and symmetry.  Bordered Mepilex dressings are removed.  Steri-Strips remain firmly intact with underlying Dermabond.  No obvious incisional drainage.  The breasts are soft throughout without any asymmetric swelling or overlying ecchymoses or other skin changes.  No palpable underlying fluid collections.  Recommend continued activity modifications and compressive garments.  She can try antihistamines for her mild itching, but no rashes appreciated on exam.  Possible delayed hypersensitivity reaction.  She looks excellent from a postoperative standpoint.  Will have her follow-up in 2 weeks but suspect that she can be cleared at that time from a postoperative standpoint.

## 2023-08-04 ENCOUNTER — Telehealth (INDEPENDENT_AMBULATORY_CARE_PROVIDER_SITE_OTHER): Payer: Self-pay | Admitting: Gastroenterology

## 2023-08-04 NOTE — Telephone Encounter (Signed)
Thanks, she can start eating more food with fiber in her diet (vegetables, prunes and fresh fruit), but no need for medication

## 2023-08-04 NOTE — Telephone Encounter (Signed)
Pt called in and was needing to know if she needed to take anything for her diverticulosis such as a stool softener. Advised pt that I did not see anything in her chart to where she would need to take anything. Advised pt that if she began to have issues to let us know. Pt verbalized understanding.

## 2023-08-07 ENCOUNTER — Encounter: Payer: Medicare HMO | Admitting: Plastic Surgery

## 2023-08-11 ENCOUNTER — Other Ambulatory Visit: Payer: Self-pay | Admitting: Adult Health

## 2023-08-12 ENCOUNTER — Ambulatory Visit: Payer: Medicare HMO | Attending: Cardiology | Admitting: Cardiology

## 2023-08-12 ENCOUNTER — Other Ambulatory Visit: Payer: Self-pay | Admitting: Adult Health

## 2023-08-12 ENCOUNTER — Encounter: Payer: Self-pay | Admitting: Cardiology

## 2023-08-12 ENCOUNTER — Telehealth: Payer: Self-pay | Admitting: Adult Health

## 2023-08-12 VITALS — BP 124/70 | HR 60 | Ht 67.0 in | Wt 147.4 lb

## 2023-08-12 DIAGNOSIS — R002 Palpitations: Secondary | ICD-10-CM | POA: Diagnosis not present

## 2023-08-12 DIAGNOSIS — I471 Supraventricular tachycardia, unspecified: Secondary | ICD-10-CM

## 2023-08-12 DIAGNOSIS — I10 Essential (primary) hypertension: Secondary | ICD-10-CM | POA: Diagnosis not present

## 2023-08-12 DIAGNOSIS — E782 Mixed hyperlipidemia: Secondary | ICD-10-CM | POA: Diagnosis not present

## 2023-08-12 MED ORDER — ATENOLOL 25 MG PO TABS: 25.0000 mg | ORAL_TABLET | Freq: Every day | ORAL | 3 refills | Status: DC

## 2023-08-12 NOTE — Telephone Encounter (Signed)
Pt stressed, has had breast implants removed and replaced, having financial issues. Requests refill on xanax, will refill.

## 2023-08-12 NOTE — Progress Notes (Signed)
Clinical Summary Ms. Pautsch is a 67 y.o.female seen today for follow up of the following medical problems.    1. PSVT - history is unclear from available notes. Notes mention PSVT documented on event recorder however do not see monitor report in our system EKGs reviewed, show normal sinus rhythm          - reported weight gain on toprol 25mg , lowered to 12.5mg  daily. Reports some nonspecific fatigue at times. We tried changing to diltiazem but high bp's and did not feel well on, back on toprol - reports infrequent palpitations.     01/2022 monitor: rare ectopy - started atenolol 12.5mg  daily, has done very well since starting.   - one episode of palpitations since last visit, overall doing well.  - taking      2. HTN - often stress/anxiety at doctors office, bp's tend to run higher here then at home. Last visit her f/u home bp's had been at goal despite being elevated in clinic - she is compliant with meds. Home bp's 130s/70s  - compliant with meds       3. Hyperlipidmeia - compliant with meds - 06/2021 TC 170 TG 113 HDL 82 LDL 86 -07/2022 TC 329 TG 518 HDL 55 LDL 83. Reports had not been fasting, likely why TGs elevated 10/2022 TC 145 TG 94 HDL 53 LDL 74     SH: recently stopped working at [employer], in between jobs Past Medical History:  Diagnosis Date   Abnormal Pap smear    Abnormal Papanicolaou smear of cervix with positive human papilloma virus (HPV) test 04/06/2018   Pap was LSIL with +HPV, will need colpo____   Anxiety    Back pain 03/07/2014   Cervical spine disease    Mild   Chest pain    Elevated cholesterol    Ganglion cyst of left foot 12/06/2013   GERD (gastroesophageal reflux disease)    H/O bilateral breast implants 03/03/2013   Hematuria 03/28/2014   Hot flashes 12/06/2013   Hypertension    Menopausal symptoms 12/06/2013   Mental disorder    anxiety   Migraine with visual aura    Osteopenia after menopause 10/06/2018   DEXA 09/28/2018,  osteopenia, take calcium vitamin D and stay active    PSVT (paroxysmal supraventricular tachycardia) (HCC)    Documented by event recorder   Rectocele 03/03/2013   RUQ pain 03/07/2014   Vaginal discharge 02/24/2014   Vaginal dryness, menopausal 12/06/2013   Vaginal Pap smear, abnormal    Yeast infection 02/24/2014     Allergies  Allergen Reactions   Fish Allergy Anaphylaxis and Swelling    Throat closes.   Bactrim Ds [Sulfamethoxazole-Trimethoprim]     Hives    Ciprocinonide [Fluocinolone] Hives    " made me feel awful"    Codeine Other (See Comments)    DROPS PT. BLOOD PRESSURE   Penicillins     Has patient had a PCN reaction causing immediate rash, facial/tongue/throat swelling, SOB or lightheadedness with hypotension:unsure Has patient had a PCN reaction causing severe rash involving mucus membranes or skin necrosis:unsure Has patient had a PCN reaction that required hospitalization:No Has patient had a PCN reaction occurring within the last 10 years:No If all of the above answers are "NO", then may proceed with Cephalosporin use. Childhood reaction    Sulfa Antibiotics Hives, Diarrhea, Nausea Only and Rash     Current Outpatient Medications  Medication Sig Dispense Refill   acetaminophen (TYLENOL) 500 MG tablet  Take 500 mg by mouth every 6 (six) hours as needed (for pain).     ALPRAZolam (XANAX) 1 MG tablet TAKE 1 TABLET BY MOUTH TWICE DAILY AS NEEDED FOR ANXIETY 40 tablet 0   amLODipine (NORVASC) 5 MG tablet Take 5 mg by mouth daily.     atenolol (TENORMIN) 25 MG tablet Take 0.5 tablets (12.5 mg total) by mouth daily. May take an additional 12.5 mg daily as needed for palpitations 90 tablet 3   dicyclomine (BENTYL) 10 MG capsule Take 1 capsule (10 mg total) by mouth 3 (three) times daily as needed for spasms. 90 capsule 1   estradiol (ESTRACE) 0.1 MG/GM vaginal cream INSERT ONE APPLICATORFUL VAGINALLY THREE TIMES A WEEK 42.5 g 3   fluticasone (FLONASE) 50 MCG/ACT  nasal spray Place 1 spray into both nostrils daily.     levocetirizine (XYZAL) 5 MG tablet Take 5 mg by mouth daily. In the morning per the patient.     omeprazole (PRILOSEC) 20 MG capsule TAKE ONE CAPSULE BY MOUTH ONCE DAILY 90 capsule 0   ondansetron (ZOFRAN-ODT) 4 MG disintegrating tablet Take 1 tablet (4 mg total) by mouth every 8 (eight) hours as needed for nausea or vomiting. 20 tablet 0   Rimegepant Sulfate (NURTEC) 75 MG TBDP Take 75 mg by mouth daily as needed. Dispense 10 or max allowed by insurance. Triptans contraindicated due to retinal migraines 10 tablet 6   rosuvastatin (CRESTOR) 10 MG tablet TAKE ONE TABLET BY MOUTH ONCE DAILY 90 tablet 3   No current facility-administered medications for this visit.     Past Surgical History:  Procedure Laterality Date   BIOPSY N/A 12/16/2019   Procedure: BIOPSY;  Surgeon: Malissa Hippo, MD;  Location: [facility];  Service: Endoscopy;  Laterality: N/A;   BREAST ENHANCEMENT SURGERY     COLONOSCOPY N/A 06/25/2016   Procedure: COLONOSCOPY;  Surgeon: Malissa Hippo, MD;  Location: [facility];  Service: Endoscopy;  Laterality: N/A;  1:00-moved to 1230 Ann to notify pt   COLONOSCOPY WITH PROPOFOL N/A 07/21/2023   Procedure: COLONOSCOPY WITH PROPOFOL;  Surgeon: Dolores Frame, MD;  Location: [facility];  Service: Gastroenterology;  Laterality: N/A;  7:30AM;ASA 1   ESOPHAGOGASTRODUODENOSCOPY (EGD) WITH PROPOFOL N/A 12/16/2019   Procedure: ESOPHAGOGASTRODUODENOSCOPY (EGD) WITH PROPOFOL;  Surgeon: Malissa Hippo, MD;  Location: [facility];  Service: Endoscopy;  Laterality: N/A;  830   POLYPECTOMY  06/25/2016   Procedure: POLYPECTOMY;  Surgeon: Malissa Hippo, MD;  Location: [facility];  Service: Endoscopy;;  colon    toe biopsy       Allergies  Allergen Reactions   Fish Allergy Anaphylaxis and Swelling    Throat closes.   Bactrim Ds [Sulfamethoxazole-Trimethoprim]     Hives    Ciprocinonide [Fluocinolone]  Hives    " made me feel awful"    Codeine Other (See Comments)    DROPS PT. BLOOD PRESSURE   Penicillins     Has patient had a PCN reaction causing immediate rash, facial/tongue/throat swelling, SOB or lightheadedness with hypotension:unsure Has patient had a PCN reaction causing severe rash involving mucus membranes or skin necrosis:unsure Has patient had a PCN reaction that required hospitalization:No Has patient had a PCN reaction occurring within the last 10 years:No If all of the above answers are "NO", then may proceed with Cephalosporin use. Childhood reaction    Sulfa Antibiotics Hives, Diarrhea, Nausea Only and Rash      Family History  Problem  Relation Age of Onset   Dementia Father    Cancer Mother    Migraines Mother    Healthy Daughter    Healthy Daughter    Healthy Daughter    Cancer Maternal Grandmother    Heart disease Maternal Grandmother    Diabetes Maternal Grandfather    Heart attack Paternal Grandmother      Social History Ms. Gandolfo reports that she has never smoked. She has never been exposed to tobacco smoke. She has never used smokeless tobacco. Ms. Mcclennon reports no history of alcohol use.   Review of Systems CONSTITUTIONAL: No weight loss, fever, chills, weakness or fatigue.  HEENT: Eyes: No visual loss, blurred vision, double vision or yellow sclerae.No hearing loss, sneezing, congestion, runny nose or sore throat.  SKIN: No rash or itching.  CARDIOVASCULAR: per hpi RESPIRATORY: No shortness of breath, cough or sputum.  GASTROINTESTINAL: No anorexia, nausea, vomiting or diarrhea. No abdominal pain or blood.  GENITOURINARY: No burning on urination, no polyuria NEUROLOGICAL: No headache, dizziness, syncope, paralysis, ataxia, numbness or tingling in the extremities. No change in bowel or bladder control.  MUSCULOSKELETAL: No muscle, back pain, joint pain or stiffness.  LYMPHATICS: No enlarged nodes. No history of splenectomy.  PSYCHIATRIC:  No history of depression or anxiety.  ENDOCRINOLOGIC: No reports of sweating, cold or heat intolerance. No polyuria or polydipsia.  Marland Kitchen   Physical Examination Today's Vitals   08/12/23 1518  BP: 124/70  Pulse: 60  SpO2: 97%  Weight: 147 lb 6.4 oz (66.9 kg)  Height: 5\' 7"  (1.702 m)   Body mass index is 23.09 kg/m.  Gen: resting comfortably, no acute distress HEENT: no scleral icterus, pupils equal round and reactive, no palptable cervical adenopathy,  CV: RRR, no mrg, no jvd Resp: Clear to auscultation bilaterally GI: abdomen is soft, non-tender, non-distended, normal bowel sounds, no hepatosplenomegaly MSK: extremities are warm, no edema.  Skin: warm, no rash Neuro:  no focal deficits Psych: appropriate affect   Diagnostic Studies  10/2020 monitor 24 hr monitor Patch Wear Time:  1 days and 1 hours (2022-04-20T15:06:18-0400 to 2022-04-21T16:51:52-0400)   Patient had a min HR of 50 bpm, max HR of 126 bpm, and avg HR of 65 bpm. Predominant underlying rhythm was Sinus Rhythm. Isolated SVEs were rare (<1.0%), and no SVE Couplets or SVE Triplets were present. Isolated VEs were rare (<1.0%), and no VE Couplets  or VE Triplets were present.    01/2022 monitor 6 day monitor Rare supraventricualr ectopy in the form of isolated PVCs, couplets Rare ventricular ectopy in the form of isolated PVCs Patient triggered events without reported symptoms No significant arrhythmias     Patch Wear Time:  5 days and 19 hours (2023-06-08T11:54:31-0400 to 2023-06-14T07:47:12-398)   Patient had a min HR of 52 bpm, max HR of 146 bpm, and avg HR of 75 bpm. Predominant underlying rhythm was Sinus Rhythm. Slight P wave morphology changes were noted. Isolated SVEs were rare (<1.0%), SVE Couplets were rare (<1.0%), and no SVE Triplets  were present. Isolated VEs were rare (<1.0%), and no VE Couplets or VE Triplets were present.   Assessment and Plan  1. PSVT - long history of palpitations  - recent  monitor with just benign ectopy - did not tolerate toprol or dilt,has done well on low dose atenolol - has been taking atenolol 25mg  as opposed to 12.5mg  and doing well, will change her prescription to 25mg  daily.    2. HTN -her bp is at goal, continue current  meds   3. Hyperlipidemia -at goal, continue current meds    F/u 1 year  Antoine Poche, M.D., F.A.C.C.

## 2023-08-12 NOTE — Patient Instructions (Signed)
Medication Instructions:   Increase Atenolol to 25mg  daily  Continue all other medications.     Labwork:  none  Testing/Procedures:  none  Follow-Up:  Your physician wants you to follow up in:  1 year.  You should receive a recall letter in the mail about 2 months prior to the time you are due.  If you don't receive this, please call our office to schedule your follow up appointment.      Any Other Special Instructions Will Be Listed Below (If Applicable).   If you need a refill on your cardiac medications before your next appointment, please call your pharmacy.

## 2023-08-12 NOTE — Telephone Encounter (Signed)
If you could please call April Knox she is calling asking for refill on the alprazolam, states that the pharmacy has faxed and you declined and she states that she doesn't understand why.

## 2023-08-18 ENCOUNTER — Ambulatory Visit: Payer: Medicare HMO | Admitting: Plastic Surgery

## 2023-08-18 DIAGNOSIS — T8549XD Other mechanical complication of breast prosthesis and implant, subsequent encounter: Secondary | ICD-10-CM

## 2023-08-18 DIAGNOSIS — Z9889 Other specified postprocedural states: Secondary | ICD-10-CM

## 2023-08-18 NOTE — Progress Notes (Signed)
The patient is a a 67 year old female here for follow-up after undergoing removal of ruptured implants and placement of new ones.  She is extremely happy with her results.  No complaints of pain.  The incisions are healing nicely.  No sign of seroma or hematoma.  Pictures were obtained of the patient and placed in the chart with the patient's or guardian's permission.

## 2023-08-27 ENCOUNTER — Other Ambulatory Visit (HOSPITAL_COMMUNITY): Payer: Medicare HMO

## 2023-08-31 NOTE — Progress Notes (Signed)
 Patient is a pleasant 67 year old female with history of breast augmentation now s/p bilateral breast implant removal and replacement performed 07/27/2023 by Dr. Lowery who presents to clinic for postoperative follow-up.   She was last seen here in clinic on 08/18/2023.  At that time, she was happy with the results and exam is benign.  Pictures were obtained and placed in chart.    Today, patient is doing well.  She has not been putting anything on her incisions.  She feels as though she has healed nicely.  She still gets some intermittent discomfort if she rolls onto her sides, but overall she is doing quite well from a postoperative standpoint.  On exam, breasts with excellent shape and symmetry.  Incisions remarkably well-healed.  Completely benign exam.  At this time, feel comfortable lifting any and all restrictions.  Discussed silicone scar gels to be applied twice daily x 3 months, but patient is not interested given minimal scarring and how well she has healed.  Follow-up only as needed.  Picture(s) obtained of the patient and placed in the chart were with the patient's or guardian's permission.

## 2023-09-01 ENCOUNTER — Encounter: Payer: Self-pay | Admitting: Physician Assistant

## 2023-09-01 ENCOUNTER — Ambulatory Visit (INDEPENDENT_AMBULATORY_CARE_PROVIDER_SITE_OTHER): Payer: Medicare HMO | Admitting: Physician Assistant

## 2023-09-01 VITALS — BP 154/81 | HR 68

## 2023-09-01 DIAGNOSIS — T8549XD Other mechanical complication of breast prosthesis and implant, subsequent encounter: Secondary | ICD-10-CM

## 2023-09-01 DIAGNOSIS — Z9889 Other specified postprocedural states: Secondary | ICD-10-CM

## 2023-09-03 ENCOUNTER — Telehealth: Payer: Self-pay

## 2023-09-03 NOTE — Telephone Encounter (Signed)
 Spoke with patient and relayed information about not taking a bath for 2 more weeks. Patient conveyed understanding.

## 2023-09-03 NOTE — Telephone Encounter (Signed)
 Patient called wanting to know if it is okay to take a bath and soak verses taking a shower.  Please f/u with patient.

## 2023-09-07 ENCOUNTER — Telehealth: Payer: Self-pay | Admitting: Adult Health

## 2023-09-07 NOTE — Telephone Encounter (Signed)
 Pt advised to reach out to provider that prescribed antibiotic for yeast med. Pt voiced understanding. JSY

## 2023-09-07 NOTE — Telephone Encounter (Signed)
 Patient called stating that she has a yeast infection after taking antibiotic from her surgery. She is wanting to know if you would send her something to [facility] or dose she need to call the Provider that scripted the a antibiotic

## 2023-09-10 ENCOUNTER — Telehealth: Payer: Self-pay | Admitting: Physician Assistant

## 2023-09-10 ENCOUNTER — Other Ambulatory Visit: Payer: Self-pay | Admitting: Adult Health

## 2023-09-10 NOTE — Telephone Encounter (Signed)
 Forwarding to the nurses. She was cleared from a post-op visit at last visit. Random fleeting burning sensations after breast surgery is likely neuropathic and not unusual. Tend to get less frequent and less severe with time.  If she is at all concerned she can schedule a f/u with me for reevaluation.

## 2023-09-10 NOTE — Telephone Encounter (Signed)
 Patient wants to know if it is normal to have random burning sensations in her breast after augmentation. Please f/u with her

## 2023-09-11 ENCOUNTER — Other Ambulatory Visit: Payer: Self-pay | Admitting: Adult Health

## 2023-09-11 ENCOUNTER — Telehealth: Payer: Self-pay | Admitting: Adult Health

## 2023-09-11 NOTE — Telephone Encounter (Signed)
 April Knox calling wanting to know if you would call her please at [phone]

## 2023-09-11 NOTE — Telephone Encounter (Signed)
 Pt only gets xanax once a month, she said the drug store sends it when she has not requested it. Will send in refill

## 2023-09-15 ENCOUNTER — Telehealth (INDEPENDENT_AMBULATORY_CARE_PROVIDER_SITE_OTHER): Payer: Self-pay | Admitting: Gastroenterology

## 2023-09-15 DIAGNOSIS — L570 Actinic keratosis: Secondary | ICD-10-CM | POA: Diagnosis not present

## 2023-09-15 DIAGNOSIS — D485 Neoplasm of uncertain behavior of skin: Secondary | ICD-10-CM | POA: Diagnosis not present

## 2023-09-15 DIAGNOSIS — L814 Other melanin hyperpigmentation: Secondary | ICD-10-CM | POA: Diagnosis not present

## 2023-09-15 NOTE — Telephone Encounter (Signed)
 Pt left voicemail and requesting call back. Returned call to patient. Pt is wanting to know if she can eat nuts and popcorn. Please advise. Thank you!

## 2023-09-15 NOTE — Telephone Encounter (Signed)
 Hi, Yes, she should be able to eat nuts and popcorn. If she notices she has pain when she eats these foods, then she should avoid them Thanks

## 2023-09-16 NOTE — Telephone Encounter (Signed)
 Pt contacted and verbalized understanding.

## 2023-09-21 ENCOUNTER — Telehealth (INDEPENDENT_AMBULATORY_CARE_PROVIDER_SITE_OTHER): Payer: Self-pay | Admitting: Gastroenterology

## 2023-09-21 NOTE — Telephone Encounter (Signed)
Pt called in and states that she feels like she has a bubble and will need to burp but can't. Pt is on Prilosec and believes it is not working as it should. Pt is wondering if provider would recommend something stronger or a different milligram.  Pt states she has more of a full feeling. Pt unable to eat at night and lie down because she will feel as if she is going to vomit.  Pt last seen 02/05/23 by Leeroy Bock. Please advise. Thank you.

## 2023-09-21 NOTE — Telephone Encounter (Signed)
Please ask patient to take Gas-x as needed. Needs to make a follow up appointment to address symptoms further Thanks

## 2023-09-21 NOTE — Telephone Encounter (Signed)
I spoke with the patient made her aware to take Gas-x as needed, and follow up here in the Clinic on 10/08/2023. Patient states understanding.

## 2023-09-21 NOTE — Telephone Encounter (Signed)
Please contact patient to have her scheduled. Thank you!

## 2023-09-23 ENCOUNTER — Other Ambulatory Visit: Payer: Self-pay | Admitting: Cardiology

## 2023-09-23 ENCOUNTER — Telehealth: Payer: Self-pay | Admitting: Physician Assistant

## 2023-09-23 NOTE — Telephone Encounter (Signed)
She has some questions about her breast and would like someone to reach out to her.

## 2023-09-24 ENCOUNTER — Ambulatory Visit: Payer: Medicaid Other | Admitting: Physician Assistant

## 2023-09-24 DIAGNOSIS — T8549XD Other mechanical complication of breast prosthesis and implant, subsequent encounter: Secondary | ICD-10-CM

## 2023-09-24 DIAGNOSIS — Z9889 Other specified postprocedural states: Secondary | ICD-10-CM

## 2023-09-24 NOTE — Progress Notes (Signed)
Patient is a pleasant 69 year old female with history of breast augmentation now s/p bilateral breast implant removal and replacement performed 07/27/2023 by Dr. Ulice Bold who joins via telephone for postoperative follow-up.   She was last seen here in clinic on 09/01/2023.  At that time, exam entirely benign.  Recommended silicone scar gels twice daily x 3 months and increase activity as tolerated.  Follow-up only as needed.    Today, patient tells me that for the past couple of weeks she has noticed intermittent left chest discomfort lasting approximately 3 to 5 minutes before spontaneously resolving.  She describes it as a cramp.  It is not exertional and there is no associated symptoms.  Specifically, there is no accompanying diaphoresis, palpitations, nausea, fatigue, or shortness of breath.  It does not radiate.  She states that it is just above the nipple and extends towards the axilla.  Discussed with patient the wide differential for her complaint.  She describes a muscle spasm, likely the pectoralis, which is not uncommon after breast surgery.  However, cannot definitively exclude cardiac etiology.  While it is not reproducible, she denies any exertional component or associated symptoms.  Discussed with patient that ACS can present atypically, especially in women, and to closely monitor her symptoms.  She understands that she may need to follow-up with PCP or go to urgent care/ED for any new or worsening symptoms.  She is otherwise doing well from a postoperative standpoint.  Plan for 1 year follow-up with Dr. Ulice Bold 08/16/2024, but she can certainly call the office should she have any questions or concerns in interim.

## 2023-09-24 NOTE — Telephone Encounter (Signed)
No that's fine, I'll put her on now and give her a call. Thanks

## 2023-10-08 ENCOUNTER — Ambulatory Visit (INDEPENDENT_AMBULATORY_CARE_PROVIDER_SITE_OTHER): Payer: Medicaid Other | Admitting: Gastroenterology

## 2023-10-10 ENCOUNTER — Other Ambulatory Visit: Payer: Self-pay | Admitting: Adult Health

## 2023-10-21 ENCOUNTER — Other Ambulatory Visit: Payer: Self-pay | Admitting: Adult Health

## 2023-10-29 DIAGNOSIS — M5459 Other low back pain: Secondary | ICD-10-CM | POA: Diagnosis not present

## 2023-10-29 DIAGNOSIS — K219 Gastro-esophageal reflux disease without esophagitis: Secondary | ICD-10-CM | POA: Diagnosis not present

## 2023-10-29 DIAGNOSIS — J309 Allergic rhinitis, unspecified: Secondary | ICD-10-CM | POA: Diagnosis not present

## 2023-10-29 DIAGNOSIS — I10 Essential (primary) hypertension: Secondary | ICD-10-CM | POA: Diagnosis not present

## 2023-10-29 DIAGNOSIS — Z0000 Encounter for general adult medical examination without abnormal findings: Secondary | ICD-10-CM | POA: Diagnosis not present

## 2023-10-29 DIAGNOSIS — R7303 Prediabetes: Secondary | ICD-10-CM | POA: Diagnosis not present

## 2023-10-29 DIAGNOSIS — Z6823 Body mass index (BMI) 23.0-23.9, adult: Secondary | ICD-10-CM | POA: Diagnosis not present

## 2023-10-29 DIAGNOSIS — G43911 Migraine, unspecified, intractable, with status migrainosus: Secondary | ICD-10-CM | POA: Diagnosis not present

## 2023-10-29 DIAGNOSIS — K581 Irritable bowel syndrome with constipation: Secondary | ICD-10-CM | POA: Diagnosis not present

## 2023-10-29 DIAGNOSIS — E7849 Other hyperlipidemia: Secondary | ICD-10-CM | POA: Diagnosis not present

## 2023-11-03 ENCOUNTER — Telehealth: Payer: Self-pay | Admitting: Adult Health

## 2023-11-03 NOTE — Telephone Encounter (Signed)
 Has gained about 15 lbs and kidney function was off. She is concerned. To send me a copy

## 2023-11-03 NOTE — Telephone Encounter (Signed)
 April Knox called this morning wanting to talk to you about some blood work that she had at her primary doctor. She is wanting to know if you would call her

## 2023-11-04 ENCOUNTER — Telehealth: Payer: Self-pay | Admitting: Adult Health

## 2023-11-04 NOTE — Telephone Encounter (Signed)
 Pt aware of labs

## 2023-11-06 ENCOUNTER — Other Ambulatory Visit: Payer: Self-pay | Admitting: Adult Health

## 2023-11-09 ENCOUNTER — Other Ambulatory Visit: Payer: Self-pay | Admitting: Adult Health

## 2023-11-18 ENCOUNTER — Telehealth: Payer: Self-pay | Admitting: Adult Health

## 2023-11-18 MED ORDER — HYDROXYZINE HCL 10 MG PO TABS: 10.0000 mg | ORAL_TABLET | Freq: Three times a day (TID) | ORAL | 3 refills | Status: DC | PRN

## 2023-11-18 NOTE — Telephone Encounter (Signed)
 I called Gabrelle and let her know that I received a call saying she was selling her xanax and that I could no longer refill them. She says she does not do that. I told her can prescribe vistaril 10 mg 1 every 8 hour prn anxiety, and she said OK. And that I had discussed this with my supervising MD.

## 2023-11-20 ENCOUNTER — Telehealth: Payer: Self-pay

## 2023-11-20 NOTE — Telephone Encounter (Signed)
 Patient would like for [name] to give her a call

## 2023-11-20 NOTE — Telephone Encounter (Signed)
 April Knox called me and said she has cried since I called her, about xanax.

## 2023-11-24 NOTE — Progress Notes (Deleted)
 Patient is a pleasant 68 year old female with history of breast augmentation now s/p bilateral breast implant removal and replacement performed 07/27/2023 by Dr. Ulice Bold who joins via telephone for postoperative follow-up.   She last joined via telephone 09/24/2023.  At that time, she described a cramping involving the left reconstructed breast.  Denies any exertional component or other cardiac symptoms.  Discussed with patient that there is a wide differential for her complaint, but it sounds most consistent with a muscle spasm, likely pectoralis, not uncommon after breast surgery.  Could not definitively exclude cardiac or pulmonary etiology.  Her previous exam in office 09/01/2023 was benign, good shape and symmetry.  Healing appropriately.

## 2023-11-26 ENCOUNTER — Ambulatory Visit: Admitting: Physician Assistant

## 2023-11-26 VITALS — BP 149/90 | HR 69

## 2023-11-26 DIAGNOSIS — T8549XD Other mechanical complication of breast prosthesis and implant, subsequent encounter: Secondary | ICD-10-CM | POA: Diagnosis not present

## 2023-11-26 NOTE — Progress Notes (Addendum)
 Patient is a pleasant 68 year old female with history of breast augmentation now s/p bilateral breast implant removal and replacement performed 07/27/2023 by Dr. Ulice Bold who presents to clinic for postoperative follow-up.   Her last appointment was via telephone 09/2023.  At that time, she described a cramping involving the left reconstructed breast.  Denies any exertional component or other cardiac symptoms.  Discussed with patient that there is a wide differential for her complaint, but it sounds most consistent with a muscle spasm, likely pectoralis, not uncommon after breast surgery.  Could not definitively exclude cardiac or pulmonary etiology.  Her previous exam in office 09/01/2023 was benign, good shape and symmetry.  Healing appropriately.  Today, patient is overall doing well.  She tells me that the cramping at left reconstructed breast resolved shortly after last encounter.  However, she states that she and her daughter were looking at her final result and felt as though maybe the left side was a bit larger or misshapened.  She wanted to ensure that there was nothing wrong such as possible rupture versus other complication.  She wanted to emphasize that she is actually pleased with the outcome of her implant exchange and cosmesis, but simply wanted to ensure that there was nothing functionally wrong on exam.  Reviewed operative report and 300 cc Mentor smooth round moderate plus profile saline implants were placed bilaterally.  It was filled to 290 cc on the right and 310 cc on the left.  She had a particularly tight contracted capsule on the left side at time of implant exchange.    On exam, breasts have good shape and symmetry.  The left NAC is slightly more inferior and laterally deviated than the contralateral side.  However, this appears consistent if not improved from preoperative photos obtained.  The implants themselves are soft throughout.  No areas of firmness or tightness otherwise  concerning for capsular contracture.  Nontender on exam.  She was reassured by today's exam.  Do not feel as though any specific imaging is needed given lack of any objective findings on exam as well as lack of any pain or other symptoms.  Low suspicion for implant injury.  Follow-up at 1 year mark with Dr. Ulice Bold, as scheduled.  She can certainly call the office should she have any questions or concerns in interim.  Picture(s) obtained of the patient and placed in the chart were with the patient's or guardian's permission.

## 2023-12-14 ENCOUNTER — Telehealth: Payer: Self-pay

## 2023-12-14 NOTE — Telephone Encounter (Signed)
 Patient called wanting to speak to our Esthetician about different products and what they actually do.

## 2024-01-01 ENCOUNTER — Telehealth: Payer: Self-pay | Admitting: Adult Health

## 2024-01-01 MED ORDER — METRONIDAZOLE 500 MG PO TABS: 500.0000 mg | ORAL_TABLET | Freq: Two times a day (BID) | ORAL | 0 refills | Status: DC

## 2024-01-01 NOTE — Telephone Encounter (Addendum)
 Pt is having some abd pain and is requesting Flagyl . Thanks! JSY

## 2024-01-01 NOTE — Addendum Note (Signed)
 Addended by: Ryett Hamman A on: 01/01/2024 11:47 AM   Modules accepted: Orders

## 2024-01-01 NOTE — Telephone Encounter (Signed)
 Pt states abdominal pain and is requesting flagyl . Please advise

## 2024-01-01 NOTE — Telephone Encounter (Signed)
Rx sent in for flagyl

## 2024-01-11 ENCOUNTER — Telehealth (INDEPENDENT_AMBULATORY_CARE_PROVIDER_SITE_OTHER): Payer: Self-pay | Admitting: Gastroenterology

## 2024-01-11 NOTE — Telephone Encounter (Signed)
 Could you please give pt a call and let her know? (Im trying to get caught up on procedures) thank you!

## 2024-01-11 NOTE — Telephone Encounter (Signed)
 Pt left voicemail asking what provider recommends taking for diverticulitis when symptoms arise.

## 2024-01-11 NOTE — Telephone Encounter (Signed)
 Left message to return.

## 2024-01-11 NOTE — Telephone Encounter (Signed)
 Discussed with patient per chelsea If she has diverticulitis symptoms she would need to be seen by a provider and given antibiotics such as flagyl  like she was given by her PCP

## 2024-01-11 NOTE — Telephone Encounter (Signed)
 Pt left voicemail. Pt states she was having discomfort in lower abdominal area and bottom felt funny. PCP placed her on Flagyl . Pt states that her stools then became tarry and dark. Pt also states she had diarrhea. Pt states she called her PCP and he said that is was the med causing the discoloration in stool. PCP advised her to take Kaopectate. Pt did take Kaopectate for several days excluding yesterday. Has been off flagyl  since Saturday. Used restroom this morning and it was more of a dark brown.   Home [phone] Cell [phone]

## 2024-01-11 NOTE — Telephone Encounter (Signed)
 April Knox, please schedule patient a follow up in office for diverticulitis. She is aware she needs appt and someone will be reaching out to her to schedule. Thanks  Discussed with patient per chelsea - Sounds like they were treating her for diverticulitis? can sometimes see darker stools with this and diarrhea could be from antibiotics or that. Would recommend she schedule a follow up visit with us . Kaopectate/pepto bismol can also cause black stools, just so she is aware. If she has further black stools associated with fevers, weakness, vomiting, severe abdominal pain, rectal bleeding, would recommend being seen in the ER for more urgent evaluation.   Patient verbalzied understanding and states she is not having any of those symptoms but is aware if she starts she should go to ED. She would like to schedule a follow up here in office.

## 2024-01-19 ENCOUNTER — Telehealth: Payer: Self-pay | Admitting: Adult Health

## 2024-01-19 NOTE — Telephone Encounter (Signed)
Pt would like a call back from [place].

## 2024-01-20 ENCOUNTER — Telehealth: Payer: Self-pay | Admitting: *Deleted

## 2024-01-20 MED ORDER — METRONIDAZOLE 0.75 % VA GEL: 1.0000 | Freq: Every day | VAGINAL | 0 refills | Status: DC

## 2024-01-20 NOTE — Telephone Encounter (Signed)
 I called pt and sent the note to JAG. JSY

## 2024-01-20 NOTE — Telephone Encounter (Signed)
 Pt states her partner has been bugging her to have anal sex. She gave in and done it. Afterwards, pt has noticed bloating and pain in stomach. Flagyl  was sent in for pt. Pt had diarrhea and black stools. Pt blamed all of this on Flagyl . She wonders now if she took the wrong med. Diarrhea is gone but pt still feels bloated and gassy. Please advise. Thanks! JSY

## 2024-01-20 NOTE — Telephone Encounter (Signed)
 Stop flagyl , will rx metrogel 

## 2024-02-01 DIAGNOSIS — K219 Gastro-esophageal reflux disease without esophagitis: Secondary | ICD-10-CM | POA: Diagnosis not present

## 2024-02-01 DIAGNOSIS — I10 Essential (primary) hypertension: Secondary | ICD-10-CM | POA: Diagnosis not present

## 2024-02-01 DIAGNOSIS — K581 Irritable bowel syndrome with constipation: Secondary | ICD-10-CM | POA: Diagnosis not present

## 2024-02-01 DIAGNOSIS — G43911 Migraine, unspecified, intractable, with status migrainosus: Secondary | ICD-10-CM | POA: Diagnosis not present

## 2024-02-01 DIAGNOSIS — N182 Chronic kidney disease, stage 2 (mild): Secondary | ICD-10-CM | POA: Diagnosis not present

## 2024-02-01 DIAGNOSIS — Z6823 Body mass index (BMI) 23.0-23.9, adult: Secondary | ICD-10-CM | POA: Diagnosis not present

## 2024-02-01 DIAGNOSIS — K921 Melena: Secondary | ICD-10-CM | POA: Diagnosis not present

## 2024-02-08 DIAGNOSIS — R7303 Prediabetes: Secondary | ICD-10-CM | POA: Diagnosis not present

## 2024-02-08 DIAGNOSIS — G43911 Migraine, unspecified, intractable, with status migrainosus: Secondary | ICD-10-CM | POA: Diagnosis not present

## 2024-02-08 DIAGNOSIS — I10 Essential (primary) hypertension: Secondary | ICD-10-CM | POA: Diagnosis not present

## 2024-02-08 DIAGNOSIS — K219 Gastro-esophageal reflux disease without esophagitis: Secondary | ICD-10-CM | POA: Diagnosis not present

## 2024-02-08 DIAGNOSIS — N182 Chronic kidney disease, stage 2 (mild): Secondary | ICD-10-CM | POA: Diagnosis not present

## 2024-02-08 DIAGNOSIS — K921 Melena: Secondary | ICD-10-CM | POA: Diagnosis not present

## 2024-02-08 DIAGNOSIS — K581 Irritable bowel syndrome with constipation: Secondary | ICD-10-CM | POA: Diagnosis not present

## 2024-02-09 DIAGNOSIS — Z1211 Encounter for screening for malignant neoplasm of colon: Secondary | ICD-10-CM | POA: Diagnosis not present

## 2024-02-15 ENCOUNTER — Telehealth (INDEPENDENT_AMBULATORY_CARE_PROVIDER_SITE_OTHER): Payer: Self-pay | Admitting: Gastroenterology

## 2024-02-15 ENCOUNTER — Ambulatory Visit (INDEPENDENT_AMBULATORY_CARE_PROVIDER_SITE_OTHER): Admitting: Gastroenterology

## 2024-02-15 ENCOUNTER — Encounter (INDEPENDENT_AMBULATORY_CARE_PROVIDER_SITE_OTHER): Payer: Self-pay | Admitting: Gastroenterology

## 2024-02-15 ENCOUNTER — Other Ambulatory Visit (INDEPENDENT_AMBULATORY_CARE_PROVIDER_SITE_OTHER): Payer: Self-pay | Admitting: Gastroenterology

## 2024-02-15 VITALS — BP 131/70 | HR 65 | Temp 97.9°F | Ht 67.0 in | Wt 149.3 lb

## 2024-02-15 DIAGNOSIS — K589 Irritable bowel syndrome without diarrhea: Secondary | ICD-10-CM

## 2024-02-15 DIAGNOSIS — K219 Gastro-esophageal reflux disease without esophagitis: Secondary | ICD-10-CM | POA: Diagnosis not present

## 2024-02-15 DIAGNOSIS — K580 Irritable bowel syndrome with diarrhea: Secondary | ICD-10-CM

## 2024-02-15 DIAGNOSIS — K581 Irritable bowel syndrome with constipation: Secondary | ICD-10-CM

## 2024-02-15 MED ORDER — RIFAXIMIN 550 MG PO TABS: 550.0000 mg | ORAL_TABLET | Freq: Three times a day (TID) | ORAL | 0 refills | Status: DC

## 2024-02-15 NOTE — Telephone Encounter (Signed)
 Pt called in stating that medication needs PA. PA competed earlier today as stated.     Your information has been submitted to [facility]. [employer] will review the request and will issue a decision, typically within 3-7 days from your submission. You can check the updated outcome later by reopening this request.  If [employer] has not responded in 3-7 days or if you have any questions about your ePA request, please contact [employer] at [phone]. If you think there may be a problem with your PA request, use our live chat feature at the bottom right.  For [place] requests, please call [phone].

## 2024-02-15 NOTE — Telephone Encounter (Signed)
 Pt called in and states that she also uses [facility]. Can we send Xifaxan to [facility]? Pt states sometimes they can get medications that other pharmacies can not. Please advise. Thank you

## 2024-02-15 NOTE — Patient Instructions (Signed)
 Lets try increasing omeprazole  to 40mg  daily Avoid greasy, spicy, fried, citrus foods, and be mindful that caffeine, carbonated drinks, chocolate and alcohol  can increase reflux symptoms Stay upright 2-3 hours after eating, prior to lying down and avoid eating late in the evenings. I am sending xifaxan 550mg  to take three times per day for 14 days to see if this helps with your IBS flare/bloating/looser stools  Follow up 4 months  It was a pleasure to see you today. I want to create trusting relationships with patients and provide genuine, compassionate, and quality care. I truly value your feedback! please be on the lookout for a survey regarding your visit with me today. I appreciate your input about our visit and your time in completing this!    April Cude L. Swetha Rayle, MSN, APRN, AGNP-C Adult-Gerontology Nurse Practitioner [facility] at [facility]

## 2024-02-15 NOTE — Telephone Encounter (Signed)
 PA completed via Cover my meds for Xifaxan 550mg . Awaiting insurance decision

## 2024-02-15 NOTE — Telephone Encounter (Signed)
 Noted. Advised pt to let us  know if any issues. Pt verbalized understanding

## 2024-02-15 NOTE — Progress Notes (Addendum)
 Referring Provider: Veda Gerald, MD Primary Care Physician:  Veda Gerald, MD Primary GI Physician: Dr. Sammi Crick   Chief Complaint  Patient presents with   Follow-up    Pt arrives for follow up. Pt states she has had some bloating, black stools, gas, and middle abdominal pain. Pt has been on Flagyl  and PCP told her to take Gas X, kaopectate. Pt has also had weight gain. Pcp placed her on Prilosec 20 mg.    HPI:   April Knox is a 68 y.o. female with past medical history of GERD and IBS   Patient presenting today for:  Follow up of IBS, GERD and bloating  Last seen June 2024, at that time reporting a lot of gas, bloating, mid to lower abdominal pain, harder stool balls. Taking gas x. GERD well controlled on omeprazole  20mg  daily  Recommended continue daily probiotic, increase water , miralax  1 capful daily, IBgard, consider EGD/colonoscopy prior to October if symptoms persists, continue omeprazole  20mg  daily   Present:  States she is having some bloating to her stomach, occurs most days. She is taking gas x which seems to help some. No nausea or vomiting. She endorses a lot of gas. Denies any changes in appetite, she is gaining weight. Previously had darker stools and diarrhea after taking flagyl  but this has resolved. She states that she was having lower abdominal pain after having intercourse. Flagyl  was given to her by PCP due to this. She denies any blood in stools. She had negative fecal occult on 6/10. Still with intermittent looser stools due to her IBS. She is taking a daily probiotic as well.   She had some regurgitation the other night but had eaten late at night, otherwise no real GERD symptoms. She does report increase in iced coffee recently, drinking 2-3 per day which she wonders if this is contributing. She notes she sometimes has to clear her throat often like she has to swallow a lot to help with this. She denies any dysphagia or odynophagia. She is taking  omeprazole  20mg  daily.    Last Colonoscopy:07/2023  - Diverticulosis in the sigmoid colon, in the                            descending colon and in the ascending colon.                           - Non-bleeding internal hemorrhoids.                           - No specimens collected.  Last Endoscopy:12/16/2019 - gastric polyps, no other findings   Recommended repeat colonoscopy in 10 years  Filed Weights   02/15/24 0814  Weight: 149 lb 4.8 oz (67.7 kg)     Past Medical History:  Diagnosis Date   Abnormal Pap smear    Abnormal Papanicolaou smear of cervix with positive human papilloma virus (HPV) test 04/06/2018   Pap was LSIL with +HPV, will need colpo____   Anxiety    Back pain 03/07/2014   Cervical spine disease    Mild   Chest pain    Elevated cholesterol    Ganglion cyst of left foot 12/06/2013   GERD (gastroesophageal reflux disease)    H/O bilateral breast implants 03/03/2013   Hematuria 03/28/2014   Hot flashes 12/06/2013   Hypertension  Menopausal symptoms 12/06/2013   Mental disorder    anxiety   Migraine with visual aura    Osteopenia after menopause 10/06/2018   DEXA 09/28/2018, osteopenia, take calcium  vitamin D and stay active    PSVT (paroxysmal supraventricular tachycardia) (HCC)    Documented by event recorder   Rectocele 03/03/2013   RUQ pain 03/07/2014   Vaginal discharge 02/24/2014   Vaginal dryness, menopausal 12/06/2013   Vaginal Pap smear, abnormal    Yeast infection 02/24/2014    Past Surgical History:  Procedure Laterality Date   BIOPSY N/A 12/16/2019   Procedure: BIOPSY;  Surgeon: Ruby Corporal, MD;  [facility];  Service: Endoscopy;  Laterality: N/A;   BREAST ENHANCEMENT SURGERY     COLONOSCOPY N/A 06/25/2016   Procedure: COLONOSCOPY;  Surgeon: Ruby Corporal, MD;  Location: [facility];  Service: Endoscopy;  Laterality: N/A;  1:00-moved to 1230 Ann to notify pt   COLONOSCOPY WITH PROPOFOL  N/A 07/21/2023   Procedure:  COLONOSCOPY WITH PROPOFOL ;  Surgeon: Urban Garden, MD;  Location: [facility];  Service: Gastroenterology;  Laterality: N/A;  7:30AM;ASA 1   ESOPHAGOGASTRODUODENOSCOPY (EGD) WITH PROPOFOL  N/A 12/16/2019   Procedure: ESOPHAGOGASTRODUODENOSCOPY (EGD) WITH PROPOFOL ;  Surgeon: Ruby Corporal, MD;  Location: [facility];  Service: Endoscopy;  Laterality: N/A;  830   POLYPECTOMY  06/25/2016   Procedure: POLYPECTOMY;  Surgeon: Ruby Corporal, MD;  Location: [facility];  Service: Endoscopy;;  colon    toe biopsy      Current Outpatient Medications  Medication Sig Dispense Refill   acetaminophen (TYLENOL) 500 MG tablet Take 500 mg by mouth every 6 (six) hours as needed (for pain).     amLODipine (NORVASC) 5 MG tablet Take 5 mg by mouth daily.     atenolol  (TENORMIN ) 25 MG tablet Take 1 tablet (25 mg total) by mouth daily. 90 tablet 3   estradiol  (ESTRACE ) 0.1 MG/GM vaginal cream INSERT ONE APPLICATORFUL VAGINALLY Three times a week 42.5 g 0   fluticasone  (FLONASE ) 50 MCG/ACT nasal spray Place 1 spray into both nostrils daily.     hydrOXYzine  (ATARAX ) 10 MG tablet Take 1 tablet (10 mg total) by mouth every 8 (eight) hours as needed. 30 tablet 3   levocetirizine (XYZAL) 5 MG tablet Take 5 mg by mouth daily. In the morning per the patient.     metroNIDAZOLE  (METROGEL ) 0.75 % vaginal gel Place 1 Applicatorful vaginally at bedtime. 70 g 0   omeprazole  (PRILOSEC) 20 MG capsule TAKE ONE CAPSULE BY MOUTH ONCE DAILY 90 capsule 0   Rimegepant Sulfate (NURTEC) 75 MG TBDP Take 75 mg by mouth daily as needed. Dispense 10 or max allowed by insurance. Triptans contraindicated due to retinal migraines 10 tablet 6   rosuvastatin  (CRESTOR ) 10 MG tablet TAKE 1 TABLET BY MOUTH DAILY 90 tablet 2   No current facility-administered medications for this visit.    Allergies as of 02/15/2024 - Review Complete 02/15/2024  Allergen Reaction Noted   Fish allergy Anaphylaxis and Swelling 06/23/2016    Bactrim ds [sulfamethoxazole-trimethoprim]  01/19/2014   Ciprocinonide [fluocinolone] Hives 01/30/2014   Codeine Other (See Comments) 06/23/2016   Penicillins  06/26/2011   Sulfa antibiotics Hives, Diarrhea, Nausea Only, and Rash 11/01/2013    Social History   Socioeconomic History   Marital status: Widowed    Spouse name: Not on file   Number of children: 3   Years of education: Not on file   Highest education level:  Not on file  Occupational History   Occupation: Unemployed  Tobacco Use   Smoking status: Never    Passive exposure: Never   Smokeless tobacco: Never  Vaping Use   Vaping status: Never Used  Substance and Sexual Activity   Alcohol  use: No    Alcohol /week: 0.0 standard drinks of alcohol    Drug use: No   Sexual activity: Yes    Birth control/protection: Post-menopausal  Other Topics Concern   Not on file  Social History Narrative   Lives alone   Right handed   Caffeine: none   Social Drivers of Health   Financial Resource Strain: Medium Risk (06/29/2023)   Overall Financial Resource Strain (CARDIA)    Difficulty of Paying Living Expenses: Somewhat hard  Food Insecurity: No Food Insecurity (06/29/2023)   Hunger Vital Sign    Worried About Running Out of Food in the Last Year: Never true    Ran Out of Food in the Last Year: Never true  Transportation Needs: No Transportation Needs (06/29/2023)   PRAPARE - Administrator, Civil Service (Medical): No    Lack of Transportation (Non-Medical): No  Physical Activity: Insufficiently Active (06/29/2023)   Exercise Vital Sign    Days of Exercise per Week: 2 days    Minutes of Exercise per Session: 10 min  Stress: No Stress Concern Present (06/29/2023)   [employer] of Occupational Health - Occupational Stress Questionnaire    Feeling of Stress : Only a little  Social Connections: Socially Isolated (06/29/2023)   Social Connection and Isolation Panel    Frequency of Communication with Friends  and Family: Once a week    Frequency of Social Gatherings with Friends and Family: Twice a week    Attends Religious Services: Never    Database administrator or Organizations: No    Attends Banker Meetings: Never    Marital Status: Widowed    Review of systems General: negative for malaise, night sweats, fever, chills, weight loss Neck: Negative for lumps, goiter, pain and significant neck swelling Resp: Negative for cough, wheezing, dyspnea at rest CV: Negative for chest pain, leg swelling, palpitations, orthopnea GI: denies melena, hematochezia, nausea, vomiting, constipation, dysphagia, odyonophagia, early satiety or unintentional weight loss. +bloating +globus sensation +looser stools  MSK: Negative for joint pain or swelling, back pain, and muscle pain. Derm: Negative for itching or rash Psych: Denies depression, anxiety, memory loss, confusion. No homicidal or suicidal ideation.  Heme: Negative for prolonged bleeding, bruising easily, and swollen nodes. Endocrine: Negative for cold or heat intolerance, polyuria, polydipsia and goiter. Neuro: negative for tremor, gait imbalance, syncope and seizures. The remainder of the review of systems is noncontributory.  Physical Exam: BP 131/70   Pulse 65   Temp 97.9 F (36.6 C)   Ht 5' 7 (1.702 m)   Wt 149 lb 4.8 oz (67.7 kg)   BMI 23.38 kg/m  General:   Alert and oriented. No distress noted. Pleasant and cooperative.  Head:  Normocephalic and atraumatic. Eyes:  Conjuctiva clear without scleral icterus. Mouth:  Oral mucosa pink and moist. Good dentition. No lesions. Heart: Normal rate and rhythm, s1 and s2 heart sounds present.  Lungs: Clear lung sounds in all lobes. Respirations equal and unlabored. Abdomen:  +BS, soft, non-tender and non-distended. No rebound or guarding. No HSM or masses noted. Derm: No palmar erythema or jaundice Msk:  Symmetrical without gross deformities. Normal posture. Extremities:  Without  edema. Neurologic:  Alert and  oriented x4 Psych:  Alert and cooperative. Normal mood and affect.  Invalid input(s): 6 MONTHS   ASSESSMENT: April Knox is a 68 y.o. female presenting today for follow up of IBS and GERD with bloating  IBS/bloating: some diarrhea and darker stools while on flagyl  which has mostly resolved. She has intermittent looser stools, some griping in lower abdomen, her main concern is bloating. No early satiety, no rectal bleeding. Could be dealing with SIBO, though likely an IBS flare. Discussed course of xifaxan to see if this will help with suspected IBS flare. She is amenable to trying this.   GERD: some globus sensation. Typically does not have much heartburn or regurgitation unless she eats late, will try increasing omeprazole  to 40mg  daily, good reflux precautions. Consider EGD if symptoms not improving though no red flag symptoms at this time to indicate urgent need for EGD.    PLAN:  -increase omeprazole  to 40mg  daily -good reflux precautions -xifaxan 550mg  TID x14 days -consider EGD if globus sensation not improving  All questions were answered, patient verbalized understanding and is in agreement with plan as outlined above.    Follow Up: 4 months  Elaine Roanhorse L. Adrien Alberta, MSN, APRN, AGNP-C Adult-Gerontology Nurse Practitioner [facility]  I have reviewed the note and agree with the APP's assessment as described in this progress note  Samantha Cress, MD Gastroenterology and Hepatology [facility] Gastroenterology

## 2024-02-16 NOTE — Telephone Encounter (Signed)
 Insurance has denied Xifaxan 550 mg tablets. Denied due to diagnosis being IBS with constipation. This is an off label use that is not medically accepted.   Yesterday when I spoke with pt and told her about the [facility] pharmacy and told her it would be $90 she stated she did not have the extra $90. Please advise. Thank you.

## 2024-02-16 NOTE — Telephone Encounter (Signed)
 E-appeal completed via cover my meds. Awaiting decision

## 2024-02-17 NOTE — Telephone Encounter (Signed)
 Noted

## 2024-02-17 NOTE — Telephone Encounter (Signed)
 Pt contacted and verbalized understanding. Pt asked me to describe medication to her; gave her information on drug and advised her it was to help with her IBS with diarrhea. Pt states she is not having diarrhea now, advised pt to take completed course as directed.

## 2024-02-17 NOTE — Telephone Encounter (Signed)
 Thank you for contacting us  about your appeal (redetermination) request on  02/16/2024. After careful review of the case, we approved the request. We have  approved XIFAXAN 550 MG TABLET from 09/02/2023 - 08/31/2024.  You may have to pay a deductible, copayment or coinsurance for these services. Please  refer to your Evidence of Coverage to determine the benefits and limitations that may  apply. Additional authorization may be required should your plan benefits or  formulary change in a new plan year, or to evaluate safety related or dosing concerns.

## 2024-02-18 ENCOUNTER — Telehealth (INDEPENDENT_AMBULATORY_CARE_PROVIDER_SITE_OTHER): Payer: Self-pay | Admitting: Gastroenterology

## 2024-02-18 NOTE — Telephone Encounter (Signed)
 FYI Pt called in and states she picked up the Xifaxan from [facility] yesterday. Pt states she woke up this morning and did not have diarrhea but is still bloated. Pt wanted to know if it was still ok to take Xifaxan and I stated yes. Pt asked was it ok to take probiotic and vitamins with Xifaxan. Advised pt yes it was ok. Pt asked how do you want me to take it? Do you want me to start at an even number like 9? Informed pt that she could start it at 9 am. Pt states well how do I take it if its 3 times a day? Advised pt she could take it at 9a, 3p, and 9p. That would be six hours apart. Pt states well I was going to try to do it 8 hours apart Advised pt that would be ok and that if she started at 9am she would take the next doses at  5pm and 10 pm.

## 2024-03-03 ENCOUNTER — Telehealth (INDEPENDENT_AMBULATORY_CARE_PROVIDER_SITE_OTHER): Payer: Self-pay | Admitting: Gastroenterology

## 2024-03-03 NOTE — Telephone Encounter (Signed)
 Pt contacted and verbalized understanding. Pt transferred up front to schedule office visit to discuss further recommendations.

## 2024-03-03 NOTE — Telephone Encounter (Signed)
 Pt called into office and states she has been taking the Xifaxan and is almost finished. Pt states she still has bloating but not near as bad. Pt states she no longer has a belly ache. Pt is wanting to know if she needs to have another round of the Xifaxan. Please advise.   [facility] [employer]

## 2024-03-14 ENCOUNTER — Telehealth (INDEPENDENT_AMBULATORY_CARE_PROVIDER_SITE_OTHER): Payer: Self-pay | Admitting: Gastroenterology

## 2024-03-14 NOTE — Telephone Encounter (Signed)
 Thanks for the update, agree with seeing her on 7/17

## 2024-03-14 NOTE — Telephone Encounter (Signed)
 Pt called in and wanted to see if she could be seen sooner than Thursday. Spoke with front and nothing available at this time. Pt states she is having diarrhea and bloating (not as bad) again. Pt was seen on 02/15/24 and given Xifaxan . Pt competed course 2 weeks ago. Pt states she had dysentery this morning. No blood, no fever, no vomiting only diarrhea and bloating. I asked pt how long had she been dealing with these symptoms and she said about 2 years. I asked her when did she begin seeing us  for those symptoms, pt states she has been seeing us  but did not really go into any detail about upset stomach, she only told us  about the bloating.   Pt sees Wilmer on Thursday 03/17/24

## 2024-03-14 NOTE — Telephone Encounter (Signed)
 Noted

## 2024-03-17 ENCOUNTER — Encounter (INDEPENDENT_AMBULATORY_CARE_PROVIDER_SITE_OTHER): Payer: Self-pay | Admitting: Gastroenterology

## 2024-03-17 ENCOUNTER — Ambulatory Visit (INDEPENDENT_AMBULATORY_CARE_PROVIDER_SITE_OTHER): Admitting: Gastroenterology

## 2024-03-17 VITALS — BP 139/81 | HR 87 | Temp 98.1°F | Ht 67.0 in | Wt 146.9 lb

## 2024-03-17 DIAGNOSIS — K580 Irritable bowel syndrome with diarrhea: Secondary | ICD-10-CM

## 2024-03-17 DIAGNOSIS — K219 Gastro-esophageal reflux disease without esophagitis: Secondary | ICD-10-CM

## 2024-03-17 MED ORDER — RIFAXIMIN 550 MG PO TABS: 550.0000 mg | ORAL_TABLET | Freq: Three times a day (TID) | ORAL | 0 refills | Status: DC

## 2024-03-17 NOTE — Progress Notes (Addendum)
 Referring Provider: Orpha Yancey LABOR, MD Primary Care Physician:  Orpha Yancey LABOR, MD Primary GI Physician: Dr. Eartha   Chief Complaint  Patient presents with   Follow-up    Pt arrives for follow up on Xifan. Pt still having dysentery, some diarrhea, and still some discomfort in lower abdominal    HPI:   April Knox is a 68 y.o. female with past medical history of GERD and IBS  Patient presenting today for:  Follow up of IBS with diarrhea after course of xifaxan  and GERD/globus sensation   Last seen June, at that time, continued to have bloating, gas, intermittent loose stools. Taking a daily probiotic. Some breakthrough on omeprazole  20mg  daily. Some globus sensation.   Recommended to increase omeprazole  to 40mg  daily, xifaxan  550mg  TID x14 days, consider EGD if globus sensation not improving.  Present: States her belly is feeling somewhat better since doing the course of xifaxan . Still having some bloating, She states her abdominal discomfort is improved, still some mild discomfort. Stools somewhat formed but having some intermittent loose stools. She denies any blood in stools or black stools.   GERD is improved since we increased her omeprazole  to 40mg  daily. Denies dysphagia or globus sensation.   Last Colonoscopy:07/2023  - Diverticulosis in the sigmoid colon, in the                            descending colon and in the ascending colon.                           - Non-bleeding internal hemorrhoids.                           - No specimens collected.   Last Endoscopy:12/16/2019 - gastric polyps, no other findings    Recommended repeat colonoscopy in 10 years  Past Medical History:  Diagnosis Date   Abnormal Pap smear    Abnormal Papanicolaou smear of cervix with positive human papilloma virus (HPV) test 04/06/2018   Pap was LSIL with +HPV, will need colpo____   Anxiety    Back pain 03/07/2014   Cervical spine disease    Mild   Chest pain    Elevated  cholesterol    Ganglion cyst of left foot 12/06/2013   GERD (gastroesophageal reflux disease)    H/O bilateral breast implants 03/03/2013   Hematuria 03/28/2014   Hot flashes 12/06/2013   Hypertension    Menopausal symptoms 12/06/2013   Mental disorder    anxiety   Migraine with visual aura    Osteopenia after menopause 10/06/2018   DEXA 09/28/2018, osteopenia, take calcium  vitamin D and stay active    PSVT (paroxysmal supraventricular tachycardia) (HCC)    Documented by event recorder   Rectocele 03/03/2013   RUQ pain 03/07/2014   Vaginal discharge 02/24/2014   Vaginal dryness, menopausal 12/06/2013   Vaginal Pap smear, abnormal    Yeast infection 02/24/2014    Past Surgical History:  Procedure Laterality Date   BIOPSY N/A 12/16/2019   Procedure: BIOPSY;  Surgeon: Golda Claudis PENNER, MD;  [facility];  Service: Endoscopy;  Laterality: N/A;   BREAST ENHANCEMENT SURGERY     COLONOSCOPY N/A 06/25/2016   Procedure: COLONOSCOPY;  Surgeon: Claudis PENNER Golda, MD;  Location: [facility];  Service: Endoscopy;  Laterality: N/A;  1:00-moved to 1230 Ann to notify  pt   COLONOSCOPY WITH PROPOFOL  N/A 07/21/2023   Procedure: COLONOSCOPY WITH PROPOFOL ;  Surgeon: Eartha Angelia Sieving, MD;  Location: [facility];  Service: Gastroenterology;  Laterality: N/A;  7:30AM;ASA 1   ESOPHAGOGASTRODUODENOSCOPY (EGD) WITH PROPOFOL  N/A 12/16/2019   Procedure: ESOPHAGOGASTRODUODENOSCOPY (EGD) WITH PROPOFOL ;  Surgeon: Golda Claudis PENNER, MD;  Location: [facility];  Service: Endoscopy;  Laterality: N/A;  830   POLYPECTOMY  06/25/2016   Procedure: POLYPECTOMY;  Surgeon: Claudis PENNER Golda, MD;  Location: [facility];  Service: Endoscopy;;  colon    toe biopsy      Current Outpatient Medications  Medication Sig Dispense Refill   acetaminophen (TYLENOL) 500 MG tablet Take 500 mg by mouth every 6 (six) hours as needed (for pain).     amLODipine (NORVASC) 5 MG tablet Take 5 mg by mouth daily.      atenolol  (TENORMIN ) 25 MG tablet Take 1 tablet (25 mg total) by mouth daily. 90 tablet 3   estradiol  (ESTRACE ) 0.1 MG/GM vaginal cream INSERT ONE APPLICATORFUL VAGINALLY Three times a week 42.5 g 0   fluticasone  (FLONASE ) 50 MCG/ACT nasal spray Place 1 spray into both nostrils daily.     hydrOXYzine  (ATARAX ) 10 MG tablet Take 1 tablet (10 mg total) by mouth every 8 (eight) hours as needed. 30 tablet 3   levocetirizine (XYZAL) 5 MG tablet Take 5 mg by mouth daily. In the morning per the patient.     metroNIDAZOLE  (METROGEL ) 0.75 % vaginal gel Place 1 Applicatorful vaginally at bedtime. 70 g 0   omeprazole  (PRILOSEC) 20 MG capsule TAKE ONE CAPSULE BY MOUTH ONCE DAILY 90 capsule 0   rifaximin  (XIFAXAN ) 550 MG TABS tablet Take 1 tablet (550 mg total) by mouth 3 (three) times daily. 42 tablet 0   Rimegepant Sulfate (NURTEC) 75 MG TBDP Take 75 mg by mouth daily as needed. Dispense 10 or max allowed by insurance. Triptans contraindicated due to retinal migraines 10 tablet 6   rosuvastatin  (CRESTOR ) 10 MG tablet TAKE 1 TABLET BY MOUTH DAILY 90 tablet 2   No current facility-administered medications for this visit.    Allergies as of 03/17/2024 - Review Complete 03/17/2024  Allergen Reaction Noted   Fish allergy Anaphylaxis and Swelling 06/23/2016   Bactrim ds [sulfamethoxazole-trimethoprim]  01/19/2014   Ciprocinonide [fluocinolone] Hives 01/30/2014   Codeine Other (See Comments) 06/23/2016   Penicillins  06/26/2011   Sulfa antibiotics Hives, Diarrhea, Nausea Only, and Rash 11/01/2013    Social History   Socioeconomic History   Marital status: Widowed    Spouse name: Not on file   Number of children: 3   Years of education: Not on file   Highest education level: Not on file  Occupational History   Occupation: Unemployed  Tobacco Use   Smoking status: Never    Passive exposure: Never   Smokeless tobacco: Never  Vaping Use   Vaping status: Never Used  Substance and Sexual Activity    Alcohol  use: No    Alcohol /week: 0.0 standard drinks of alcohol    Drug use: No   Sexual activity: Yes    Birth control/protection: Post-menopausal  Other Topics Concern   Not on file  Social History Narrative   Lives alone   Right handed   Caffeine: none   Social Drivers of Health   Financial Resource Strain: Medium Risk (06/29/2023)   Overall Financial Resource Strain (CARDIA)    Difficulty of Paying Living Expenses: Somewhat hard  Food Insecurity: No Food Insecurity (06/29/2023)  Hunger Vital Sign    Worried About Running Out of Food in the Last Year: Never true    Ran Out of Food in the Last Year: Never true  Transportation Needs: No Transportation Needs (06/29/2023)   PRAPARE - Administrator, Civil Service (Medical): No    Lack of Transportation (Non-Medical): No  Physical Activity: Insufficiently Active (06/29/2023)   Exercise Vital Sign    Days of Exercise per Week: 2 days    Minutes of Exercise per Session: 10 min  Stress: No Stress Concern Present (06/29/2023)   [employer] of Occupational Health - Occupational Stress Questionnaire    Feeling of Stress : Only a little  Social Connections: Socially Isolated (06/29/2023)   Social Connection and Isolation Panel    Frequency of Communication with Friends and Family: Once a week    Frequency of Social Gatherings with Friends and Family: Twice a week    Attends Religious Services: Never    Database administrator or Organizations: No    Attends Banker Meetings: Never    Marital Status: Widowed    Review of systems General: negative for malaise, night sweats, fever, chills, weight loss Neck: Negative for lumps, goiter, pain and significant neck swelling Resp: Negative for cough, wheezing, dyspnea at rest CV: Negative for chest pain, leg swelling, palpitations, orthopnea GI: denies melena, hematochezia, nausea, vomiting, constipation, dysphagia, odyonophagia, early satiety or  unintentional weight loss. +abdominal discomfort +abdominal bloating +looser stools MSK: Negative for joint pain or swelling, back pain, and muscle pain. Derm: Negative for itching or rash Psych: Denies depression, anxiety, memory loss, confusion. No homicidal or suicidal ideation.  Heme: Negative for prolonged bleeding, bruising easily, and swollen nodes. Endocrine: Negative for cold or heat intolerance, polyuria, polydipsia and goiter. Neuro: negative for tremor, gait imbalance, syncope and seizures. The remainder of the review of systems is noncontributory.  Physical Exam: BP 139/81   Pulse 87   Temp 98.1 F (36.7 C)   Ht 5' 7 (1.702 m)   Wt 146 lb 14.4 oz (66.6 kg)   BMI 23.01 kg/m  General:   Alert and oriented. No distress noted. Pleasant and cooperative.  Head:  Normocephalic and atraumatic. Eyes:  Conjuctiva clear without scleral icterus. Mouth:  Oral mucosa pink and moist. Good dentition. No lesions. Heart: Normal rate and rhythm, s1 and s2 heart sounds present.  Lungs: Clear lung sounds in all lobes. Respirations equal and unlabored. Abdomen:  +BS, soft, non-tender and non-distended. No rebound or guarding. No HSM or masses noted. Derm: No palmar erythema or jaundice Msk:  Symmetrical without gross deformities. Normal posture. Extremities:  Without edema. Neurologic:  Alert and  oriented x4 Psych:  Alert and cooperative. Normal mood and affect.  Invalid input(s): 6 MONTHS   ASSESSMENT: April Knox is a 68 y.o. female presenting today for follow up of IBS with diarrhea, GERD/globus sensation  IBS with diarrhea: completed course of xifaxan  with some improvement in symptoms though not total resolution. Continues to have some bloating, abdominal discomfort and looser stools. We discussed repeating course of xifaxan  which hopefully will provide resolution of her symptoms as 1st course provided some improvement. She is amenable to this.   GERD/globus sensation: GERD  well controlled, resolution of globus sensation with increase in PPI dosing. Will continue with omeprazole  40mg  daily, good reflux precautions.    PLAN:  -repeat xifaxan  course 550mg  TID x14 days  -continue daily probiotic  -continue omeprazole  40mg  daily  -good  reflux precautions   All questions were answered, patient verbalized understanding and is in agreement with plan as outlined above.   Follow Up: 4 months   Graham Doukas L. Mariette, MSN, APRN, AGNP-C Adult-Gerontology Nurse Practitioner [facility]   I have reviewed the note and agree with the APP's assessment as described in this progress note  If persistent bloating may consider a 4-day trial of sucraid   Toribio Fortune, MD Gastroenterology and Hepatology [facility]

## 2024-03-17 NOTE — Patient Instructions (Signed)
 We will continue omeprazole  40mg  daily for your acid reflux I will send another course of xifaxan  as you had some improvement but not complete resolution of your symptoms  Follow up 4 months

## 2024-03-21 ENCOUNTER — Telehealth (INDEPENDENT_AMBULATORY_CARE_PROVIDER_SITE_OTHER): Payer: Self-pay | Admitting: Gastroenterology

## 2024-03-21 NOTE — Telephone Encounter (Signed)
 Pt left message stating that she did not hear from her pharmacy in regards to Xifaxan  and pt states she did not have a good weekend. Contacted [facility] and they states the PA went through and they will get the script ready. Contacted pt and made her aware. Pt verbalized understanding.

## 2024-03-23 NOTE — Telephone Encounter (Signed)
 Pt left message asking for a return call. Returned call to pt and she stated that [facility] was unable to get Xifaxan  so she had to go to [place]. Pt was able to pick up medication at [facility].

## 2024-03-28 DIAGNOSIS — L814 Other melanin hyperpigmentation: Secondary | ICD-10-CM | POA: Diagnosis not present

## 2024-03-28 DIAGNOSIS — I781 Nevus, non-neoplastic: Secondary | ICD-10-CM | POA: Diagnosis not present

## 2024-03-28 DIAGNOSIS — L570 Actinic keratosis: Secondary | ICD-10-CM | POA: Diagnosis not present

## 2024-03-28 DIAGNOSIS — D1801 Hemangioma of skin and subcutaneous tissue: Secondary | ICD-10-CM | POA: Diagnosis not present

## 2024-04-05 DIAGNOSIS — L0100 Impetigo, unspecified: Secondary | ICD-10-CM | POA: Diagnosis not present

## 2024-05-03 ENCOUNTER — Telehealth (INDEPENDENT_AMBULATORY_CARE_PROVIDER_SITE_OTHER): Payer: Self-pay | Admitting: *Deleted

## 2024-05-03 NOTE — Telephone Encounter (Signed)
 Please call patient - she would like to discuss some of the medicine she is taking  [phone]

## 2024-05-03 NOTE — Telephone Encounter (Signed)
 Pt contacted. Pt states the Xifaxan  worked great and she feels much better. Yesterday she states she noticed a little belly ache and pain around navel area and she was bloated. She is feeling better today. No diarrhea. Pt is wanting to know if she needs another round of Xifaxan . Pt states she believes her issue has gotten so severe, states she told her PCP about this and had been doing at home care. Pt is wanting to know next step. Should she take a pro/pre biotic or both? Is that something that prescriber calls in? (Advised pt pre/probiotics are OTC). Any recommendations for at home care that pt should be doing? Please advise. Thank you!

## 2024-05-03 NOTE — Telephone Encounter (Signed)
 Pt contacted and verbalized understanding. Pt states she will keep her follow up in November to discuss further management.

## 2024-05-12 ENCOUNTER — Telehealth (INDEPENDENT_AMBULATORY_CARE_PROVIDER_SITE_OTHER): Payer: Self-pay | Admitting: Gastroenterology

## 2024-05-12 DIAGNOSIS — G43911 Migraine, unspecified, intractable, with status migrainosus: Secondary | ICD-10-CM | POA: Diagnosis not present

## 2024-05-12 DIAGNOSIS — N182 Chronic kidney disease, stage 2 (mild): Secondary | ICD-10-CM | POA: Diagnosis not present

## 2024-05-12 DIAGNOSIS — Z0000 Encounter for general adult medical examination without abnormal findings: Secondary | ICD-10-CM | POA: Diagnosis not present

## 2024-05-12 DIAGNOSIS — I10 Essential (primary) hypertension: Secondary | ICD-10-CM | POA: Diagnosis not present

## 2024-05-12 DIAGNOSIS — K581 Irritable bowel syndrome with constipation: Secondary | ICD-10-CM | POA: Diagnosis not present

## 2024-05-12 DIAGNOSIS — Z6823 Body mass index (BMI) 23.0-23.9, adult: Secondary | ICD-10-CM | POA: Diagnosis not present

## 2024-05-12 DIAGNOSIS — K219 Gastro-esophageal reflux disease without esophagitis: Secondary | ICD-10-CM | POA: Diagnosis not present

## 2024-05-12 NOTE — Telephone Encounter (Signed)
 Pt contacted. Pt states she did not understand why she has to come in when she has talked about IBS at each visit. Advised pt that we would need documentation for insurance purposes in case what ever med we send in needs PA. Spoke with Devere and the soonest appt is 06/06/24 at 8:15am. Pt states ok to put her down but she may have to cancel if she has another appt that she forgot about. Spoke with Devere and she has put her in that slot and also keeping November appt as well.

## 2024-05-12 NOTE — Telephone Encounter (Signed)
 Pt contacted. Pt states she is having bloating and some discomfort in middle of abdomen (navel area). No diarrhea, normal bowel color, going 1-2 times a day. Pt states she has a full feeling, she can eat a small amount and feel like she has ate a 4 course meal. Pt states bloating in miserable and its an uncomfortable feeling. Pt also stated that her bowels were dark in color but not anymore. The discomfort does not happen all the time. Please advise. Thank you  [facility]

## 2024-05-12 NOTE — Telephone Encounter (Signed)
 Pt left voicemail. Pt states she was wondering if something can be sent in for her IBS with bloating. Pt states she is only on the Prilosec. Pt states they know I have it, so can something be sent in so I can be trying it? Please advise. Thank you!

## 2024-05-15 DIAGNOSIS — N200 Calculus of kidney: Secondary | ICD-10-CM | POA: Diagnosis not present

## 2024-05-15 DIAGNOSIS — E860 Dehydration: Secondary | ICD-10-CM | POA: Diagnosis not present

## 2024-05-15 DIAGNOSIS — N281 Cyst of kidney, acquired: Secondary | ICD-10-CM | POA: Diagnosis not present

## 2024-05-15 DIAGNOSIS — K219 Gastro-esophageal reflux disease without esophagitis: Secondary | ICD-10-CM | POA: Diagnosis not present

## 2024-05-15 DIAGNOSIS — M549 Dorsalgia, unspecified: Secondary | ICD-10-CM | POA: Diagnosis not present

## 2024-05-15 DIAGNOSIS — R1030 Lower abdominal pain, unspecified: Secondary | ICD-10-CM | POA: Diagnosis not present

## 2024-05-15 DIAGNOSIS — K449 Diaphragmatic hernia without obstruction or gangrene: Secondary | ICD-10-CM | POA: Diagnosis not present

## 2024-05-15 DIAGNOSIS — K7689 Other specified diseases of liver: Secondary | ICD-10-CM | POA: Diagnosis not present

## 2024-05-16 DIAGNOSIS — M5432 Sciatica, left side: Secondary | ICD-10-CM | POA: Diagnosis not present

## 2024-05-16 DIAGNOSIS — Z6823 Body mass index (BMI) 23.0-23.9, adult: Secondary | ICD-10-CM | POA: Diagnosis not present

## 2024-05-17 ENCOUNTER — Encounter (HOSPITAL_COMMUNITY): Payer: Self-pay | Admitting: Emergency Medicine

## 2024-05-17 ENCOUNTER — Other Ambulatory Visit: Payer: Self-pay

## 2024-05-17 ENCOUNTER — Emergency Department (HOSPITAL_COMMUNITY)
Admission: EM | Admit: 2024-05-17 | Discharge: 2024-05-17 | Disposition: A | Discharge status: Home / Self Care | Attending: Emergency Medicine | Admitting: Emergency Medicine

## 2024-05-17 DIAGNOSIS — Z79899 Other long term (current) drug therapy: Secondary | ICD-10-CM | POA: Diagnosis not present

## 2024-05-17 DIAGNOSIS — I10 Essential (primary) hypertension: Secondary | ICD-10-CM | POA: Insufficient documentation

## 2024-05-17 DIAGNOSIS — M461 Sacroiliitis, not elsewhere classified: Secondary | ICD-10-CM | POA: Diagnosis not present

## 2024-05-17 DIAGNOSIS — M545 Low back pain, unspecified: Secondary | ICD-10-CM | POA: Diagnosis present

## 2024-05-17 DIAGNOSIS — M533 Sacrococcygeal disorders, not elsewhere classified: Secondary | ICD-10-CM | POA: Diagnosis not present

## 2024-05-17 MED ORDER — METHOCARBAMOL 500 MG PO TABS: 500.0000 mg | ORAL_TABLET | Freq: Three times a day (TID) | ORAL | 0 refills | Status: DC

## 2024-05-17 NOTE — ED Provider Notes (Signed)
  EMERGENCY DEPARTMENT AT [facility] Provider Note   CSN: [id number] Arrival date & time: 05/17/24  0830     Patient presents with: Back Pain   April Knox is a 68 y.o. female.    Back Pain Associated symptoms: no abdominal pain, no chest pain, no dysuria, no fever, no numbness, no pelvic pain and no weakness        April Knox is a 68 y.o. female with past medical history of hypertension, back pain, GERD who presents to the Emergency Department complaining of localized pain at her left lower back/SI joint space x 2 days.  Pain began while walking up some steps.  She denies any fall.  She states that she was turning or twisting at the time as well.  She felt a sharp pain into her lower abdomen that radiated around into her left low back and buttock area.  The abdominal pain was fleeting only lasting a few seconds but she has had persistent pain in her left lower back that worsens with movement and resolves with while at rest.  She has also had difficulty sleeping at night due to pain and feeling as though she cannot get comfortable.  She was seen at [facility] 2 days ago and had CT of her abdomen and pelvis that showed some renal cysts.  She had follow-up with her PCP and a prescription for prednisone was prescribed but she has not gotten the medication filled yet.  She is here requesting second opinion.  She denies any urine or bowel changes, fever or chills, abdominal pain, pain numbness or weakness of her lower extremities or groin area.  Prior to Admission medications   Medication Sig Start Date End Date Taking? Authorizing Provider  acetaminophen (TYLENOL) 500 MG tablet Take 500 mg by mouth every 6 (six) hours as needed (for pain).    [provider]  amLODipine (NORVASC) 5 MG tablet Take 5 mg by mouth daily. 01/29/22   [provider]  atenolol  (TENORMIN ) 25 MG tablet Take 1 tablet (25 mg total) by mouth daily. 08/12/23   Alvan Dorn FALCON,  MD  estradiol  (ESTRACE ) 0.1 MG/GM vaginal cream INSERT ONE APPLICATORFUL VAGINALLY Three times a week 10/22/23   Signa Nest A, NP  fluticasone  (FLONASE ) 50 MCG/ACT nasal spray Place 1 spray into both nostrils daily. 01/22/22   [provider]  hydrOXYzine  (ATARAX ) 10 MG tablet Take 1 tablet (10 mg total) by mouth every 8 (eight) hours as needed. 11/18/23   Signa Nest LABOR, NP  levocetirizine (XYZAL) 5 MG tablet Take 5 mg by mouth daily. In the morning per the patient.    [provider]  metroNIDAZOLE  (METROGEL ) 0.75 % vaginal gel Place 1 Applicatorful vaginally at bedtime. 01/20/24   Signa Nest LABOR, NP  omeprazole  (PRILOSEC) 20 MG capsule TAKE ONE CAPSULE BY MOUTH ONCE DAILY 06/04/21   Eartha Flavors, Toribio, MD  rifaximin  (XIFAXAN ) 550 MG TABS tablet Take 1 tablet (550 mg total) by mouth 3 (three) times daily. 03/17/24   Carlan, Chelsea L, NP  Rimegepant Sulfate (NURTEC) 75 MG TBDP Take 75 mg by mouth daily as needed. Dispense 10 or max allowed by insurance. Triptans contraindicated due to retinal migraines 05/15/20   Ines Onetha NOVAK, MD  rosuvastatin  (CRESTOR ) 10 MG tablet TAKE 1 TABLET BY MOUTH DAILY 09/23/23   Alvan Dorn FALCON, MD    Allergies: Fish allergy, Bactrim ds [sulfamethoxazole-trimethoprim], Ciprocinonide [fluocinolone], Codeine, Penicillins, and Sulfa antibiotics    Review  of Systems  Constitutional:  Negative for chills and fever.  Respiratory:  Negative for shortness of breath.   Cardiovascular:  Negative for chest pain.  Gastrointestinal:  Negative for abdominal pain, constipation, diarrhea, nausea and vomiting.  Genitourinary:  Negative for dysuria, flank pain and pelvic pain.  Musculoskeletal:  Positive for back pain.  Neurological:  Negative for dizziness, weakness and numbness.    Updated Vital Signs BP (!) 146/68 (BP Location: Right Arm)   Pulse 64   Temp 97.9 F (36.6 C) (Oral)   Resp 18   Ht 5' 7 (1.702 m)   Wt 67.1 kg   SpO2  97%   BMI 23.18 kg/m   Physical Exam Vitals and nursing note reviewed.  Constitutional:      General: She is not in acute distress.    Appearance: Normal appearance. She is not toxic-appearing.  Cardiovascular:     Rate and Rhythm: Normal rate and regular rhythm.     Pulses: Normal pulses.  Pulmonary:     Effort: Pulmonary effort is normal.     Breath sounds: Normal breath sounds.  Abdominal:     Palpations: Abdomen is soft.     Tenderness: There is no abdominal tenderness. There is no right CVA tenderness or left CVA tenderness.  Musculoskeletal:        General: No tenderness. Normal range of motion.     Comments: Mild localized, ttp of the left SI joint space.  Pt has FROM of the bilateral hips.  No midline spinal tenderness.   Skin:    General: Skin is warm.     Capillary Refill: Capillary refill takes less than 2 seconds.  Neurological:     General: No focal deficit present.     Mental Status: She is alert.     Sensory: Sensation is intact. No sensory deficit.     Motor: Motor function is intact. No weakness.     Coordination: Coordination is intact.     Gait: Gait is intact. Gait normal.     Deep Tendon Reflexes:     Reflex Scores:      Patellar reflexes are 2+ on the right side and 2+ on the left side.      Achilles reflexes are 2+ on the right side and 2+ on the left side.    (all labs ordered are listed, but only abnormal results are displayed) Labs Reviewed - No data to display  EKG: None  Radiology: No results found.   Procedures   Medications Ordered in the ED - No data to display                                  Medical Decision Making Patient here for evaluation of left-sided low back pain x 2 days ago.  She felt a sharp pain in her lower abdomen that radiated into her left SI joint space and occurred while walking up the steps and twisting.  She denies having any further abdominal pain but continues to have localized pain at the left SI joint  space.  She followed up with her primary care provider, but here today for second opinion.  She denies fall, numbness or weakness of her lower extremities, saddle anesthesias, abdominal pain, urine or bowel changes.  No red flags on exam, she is ambulatory with brisk and steady gait.  I do not appreciate any focal neurodeficits.  Patient had CT abdomen and  pelvis on 05/15/2024 at [facility] and I have personally reviewed those imaging results.  Suspect SI joint space inflammation, low back pain,     Amount and/or Complexity of Data Reviewed Discussion of management or test interpretation with external provider(s): I feel the symptoms are likely secondary to inflammatory process at the left SI joint space.  Her gait is steady and brisk and she has a very reassuring neurologic exam.  Her abdominal exam is benign.  She was prescribed prednisone by her PCP but she has not picked this medication up yet.  She was also recommended to take ibuprofen which she has not started.  Patient reassured, will add muscle relaxer and she is agreeable to over-the-counter lidocaine  patches to the affected area.  Feel that she is appropriate for discharge home, no indication for further imaging although I have offered x-ray of her lower back but patient declined.  She will follow-up closely outpatient with her PCP and strict return precautions were given  Risk Prescription drug management.        Final diagnoses:  SI (sacroiliac) joint inflammation [facility])    ED Discharge Orders     None          Herlinda Milling, PA-C 05/21/24 [id number]    Dean Clarity, MD 05/21/24 1557

## 2024-05-17 NOTE — Discharge Instructions (Signed)
 Try sleeping in the fetal position with a pillow between your knees.  You may also try over-the-counter 4% lidocaine  patches.  Apply the patch to the affected area.  You may leave on for 10 hours then remove for 10 hours.  Avoid bending twisting or heavy lifting for at least 1 week.  Start your prednisone prescription today.  Follow-up with your primary care provider for recheck return to the emergency department for any new or worsening symptoms.

## 2024-05-17 NOTE — ED Notes (Signed)
 ED Provider at bedside.

## 2024-05-17 NOTE — ED Triage Notes (Signed)
 Pt states two days ago she was walking up her stairs and felt some lower abd pain that came on quick and sharp radiating around to her lower back. Pt went to [facility] and was told she had a cyst on her kidney and to f/u w/ her primary. Pt states she still has pain to left lower back. Denies any abd pain. States she wants further evaluation as to why she has this pain.

## 2024-05-18 DIAGNOSIS — Z6822 Body mass index (BMI) 22.0-22.9, adult: Secondary | ICD-10-CM | POA: Diagnosis not present

## 2024-05-18 DIAGNOSIS — M5432 Sciatica, left side: Secondary | ICD-10-CM | POA: Diagnosis not present

## 2024-05-19 ENCOUNTER — Other Ambulatory Visit: Payer: Self-pay | Admitting: Cardiology

## 2024-05-30 DIAGNOSIS — Z9882 Breast implant status: Secondary | ICD-10-CM | POA: Diagnosis not present

## 2024-05-31 DIAGNOSIS — I10 Essential (primary) hypertension: Secondary | ICD-10-CM | POA: Diagnosis not present

## 2024-05-31 DIAGNOSIS — N182 Chronic kidney disease, stage 2 (mild): Secondary | ICD-10-CM | POA: Diagnosis not present

## 2024-06-02 ENCOUNTER — Ambulatory Visit (INDEPENDENT_AMBULATORY_CARE_PROVIDER_SITE_OTHER): Admitting: Student

## 2024-06-02 VITALS — BP 144/76 | HR 70

## 2024-06-02 DIAGNOSIS — N6459 Other signs and symptoms in breast: Secondary | ICD-10-CM | POA: Diagnosis not present

## 2024-06-02 DIAGNOSIS — T8543XD Leakage of breast prosthesis and implant, subsequent encounter: Secondary | ICD-10-CM

## 2024-06-02 NOTE — Progress Notes (Signed)
 Referring Provider Orpha Yancey LABOR, MD [address] [place],  [place] [address]   CC:  Chief Complaint  Patient presents with   Follow-up      April Knox is an 68 y.o. female.  HPI: Patient is a 68 year old female with history of breast augmentation.  She recently underwent bilateral breast implant removal and replacement with Dr. Lowery on 07/27/2023.  Intraoperatively, patient had Mentor smooth round moderate plus profile saline implants placed.  She presents to the clinic today with concerns about her implants.  Patient was last seen in the clinic on 11/26/2023.  At this visit, breast had good shape and symmetry.  The left NAC was slightly more inferior and laterally deviated than the contralateral side.  However, it appeared to be consistent if not improved from the preoperative photos obtained.  The implants themselves were soft throughout.  There were no areas of firmness or tightness.  Exam was reassuring.  Today, patient reports she is overall doing well.  She states that she had a mammogram recently and reports that since then, she feels that the right breast is a lot softer than the left breast.  She is concerned that the implant in the right breast may have ruptured.  She denies any fevers or chills.  She denies any redness or pain over the area.  She denies any traumas to the area.  She denies any other issues or concerns at this time.  Per chart review, patient had mammogram done on 05/30/2024.  Results were BI-RADS Category 2 negative.  There is no evidence of malignancy on exam.  Also per mammogram results report, bilateral retropectoral saline implants were present and intact.   Review of Systems General: Denies any fevers or chills  Physical Exam    06/02/2024    3:14 PM 05/17/2024   10:08 AM 05/17/2024    9:37 AM  Vitals with BMI  Systolic 144 144 853  Diastolic 76 70 68  Pulse 70 66 64    General:  No acute distress,  Alert and oriented, Non-Toxic,  Normal speech and affect Chaperone present on exam.  On exam, patient is sitting upright in no acute distress.  Overall breasts are soft and symmetric.  The right breast is sitting a little bit more inferiorly than the left breast.  The right side is a little bit more lax than the left side.  There is no overlying erythema.  No obvious fluid collections on exam.  Implants are palpated bilaterally and appear to be intact.  No signs of infection on exam.  Assessment/Plan  Breast implant rupture, subsequent encounter   I went over the results of the mammogram with the patient and discussed with her that implants appear to be intact on her imaging.  I also discussed with her that based on her exam today, implant does appear to be intact at this time.  I discussed with her would like her to continue to monitor her right breast and if she feels like there are any changes she can let us  know.  I discussed with her that if she feels it gets smaller or she has any concerns for rupture, we may consider ultrasound imaging later on.  She expressed understanding was in agreement with this plan.  Patient has an appointment with Dr. Lowery coming up November, recommended she keep that appointment.  Instructed her to call if she has any questions or concerns about anything.  April Knox 06/02/2024, 3:37 PM

## 2024-06-03 ENCOUNTER — Ambulatory Visit: Admitting: Plastic Surgery

## 2024-06-06 ENCOUNTER — Ambulatory Visit (INDEPENDENT_AMBULATORY_CARE_PROVIDER_SITE_OTHER): Admitting: Gastroenterology

## 2024-06-06 ENCOUNTER — Encounter (INDEPENDENT_AMBULATORY_CARE_PROVIDER_SITE_OTHER): Payer: Self-pay | Admitting: Gastroenterology

## 2024-06-06 VITALS — BP 126/76 | HR 74 | Temp 98.2°F | Ht 67.0 in | Wt 151.4 lb

## 2024-06-06 DIAGNOSIS — K580 Irritable bowel syndrome with diarrhea: Secondary | ICD-10-CM | POA: Diagnosis not present

## 2024-06-06 DIAGNOSIS — K219 Gastro-esophageal reflux disease without esophagitis: Secondary | ICD-10-CM

## 2024-06-06 DIAGNOSIS — R140 Abdominal distension (gaseous): Secondary | ICD-10-CM

## 2024-06-06 DIAGNOSIS — K582 Mixed irritable bowel syndrome: Secondary | ICD-10-CM

## 2024-06-06 NOTE — Patient Instructions (Signed)
 Explained presumed etiology of IBS symptoms. Patient was counseled about the benefit of implementing a low FODMAP to improve symptoms and recurrent episodes. A dietary list was provided to the patient. Also, the patient was counseled about the benefit of avoiding stressing situations and potential environmental triggers leading to symptomatology.  Start IBGard 1 tablet every 8 hours as needed for bloating. Can use peppermint tea or edible peppermint as well.

## 2024-06-06 NOTE — Progress Notes (Signed)
 Toribio Fortune, M.D. Gastroenterology & Hepatology [facility] [address] [place], [place] [address]  Primary Care Physician: Orpha Yancey LABOR, MD [address] [place] [address]  I will communicate my assessment and recommendations to the referring MD via EMR.  Problems: IBS-M GERD  History of Present Illness: April Knox is a 68 y.o. female with PMH IBS with diarrhea, GERD/globus sensation, who presents for follow up of IBS.  The patient was last seen on 03/17/24. At that time, the patient was given a course of Xifaxan  for 2 weeks and was advised to continue using probiotics and omeprazole  daily.  Patient reports that after the course of Xifaxan , she is having 1-2 episodes of formed stools daily.   She is concerned as sometimes she has some intermittent bloating and some LLQ pain. She has gained weight for the last year which has made her concerned. She does not follow any type of diet but tries to eat healthy.  Denies any frequent heartburn, although she had one episode of regurgitation when she ate late one night.  The patient denies having any nausea, vomiting, fever, chills, hematochezia, melena, hematemesis, diarrhea, jaundice, pruritus or weight loss.  Last Colonoscopy:07/2023  - Diverticulosis in the sigmoid colon, in the                            descending colon and in the ascending colon.                           - Non-bleeding internal hemorrhoids.                           - No specimens collected.   Last Endoscopy:12/16/2019 - gastric polyps, no other findings    Recommended repeat colonoscopy in 10 years  Past Medical History: Past Medical History:  Diagnosis Date   Abnormal Pap smear    Abnormal Papanicolaou smear of cervix with positive human papilloma virus (HPV) test 04/06/2018   Pap was LSIL with +HPV, will need colpo____   Anxiety    Back pain 03/07/2014   Cervical spine disease    Mild    Chest pain    Elevated cholesterol    Ganglion cyst of left foot 12/06/2013   GERD (gastroesophageal reflux disease)    H/O bilateral breast implants 03/03/2013   Hematuria 03/28/2014   Hot flashes 12/06/2013   Hypertension    Menopausal symptoms 12/06/2013   Mental disorder    anxiety   Migraine with visual aura    Osteopenia after menopause 10/06/2018   DEXA 09/28/2018, osteopenia, take calcium  vitamin D and stay active    PSVT (paroxysmal supraventricular tachycardia)    Documented by event recorder   Rectocele 03/03/2013   RUQ pain 03/07/2014   Vaginal discharge 02/24/2014   Vaginal dryness, menopausal 12/06/2013   Vaginal Pap smear, abnormal    Yeast infection 02/24/2014    Past Surgical History: Past Surgical History:  Procedure Laterality Date   BIOPSY N/A 12/16/2019   Procedure: BIOPSY;  Surgeon: Golda Claudis PENNER, MD;  [facility];  Service: Endoscopy;  Laterality: N/A;   BREAST ENHANCEMENT SURGERY     COLONOSCOPY N/A 06/25/2016   Procedure: COLONOSCOPY;  Surgeon: Claudis PENNER Golda, MD;  Location: [facility];  Service: Endoscopy;  Laterality: N/A;  1:00-moved to 1230 Ann to  notify pt   COLONOSCOPY WITH PROPOFOL  N/A 07/21/2023   Procedure: COLONOSCOPY WITH PROPOFOL ;  Surgeon: Eartha Angelia Sieving, MD;  [facility]: [facility];  Service: Gastroenterology;  Laterality: N/A;  7:30AM;ASA 1   ESOPHAGOGASTRODUODENOSCOPY (EGD) WITH PROPOFOL  N/A 12/16/2019   Procedure: ESOPHAGOGASTRODUODENOSCOPY (EGD) WITH PROPOFOL ;  Surgeon: Golda Claudis PENNER, MD;  [facility];  Service: Endoscopy;  Laterality: N/A;  830   POLYPECTOMY  06/25/2016   Procedure: POLYPECTOMY;  Surgeon: Claudis PENNER Golda, MD;  [facility];  Service: Endoscopy;;  colon    toe biopsy      Family History: Family History  Problem Relation Age of Onset   Dementia Father    Cancer Mother    Migraines Mother    Healthy Daughter    Healthy Daughter    Healthy Daughter     Cancer Maternal Grandmother    Heart disease Maternal Grandmother    Diabetes Maternal Grandfather    Heart attack Paternal Grandmother     Social History: Social History   Tobacco Use  Smoking Status Never   Passive exposure: Never  Smokeless Tobacco Never   Social History   Substance and Sexual Activity  Alcohol  Use No   Alcohol /week: 0.0 standard drinks of alcohol    Social History   Substance and Sexual Activity  Drug Use No    Allergies: Allergies  Allergen Reactions   Fish Allergy Anaphylaxis and Swelling    Throat closes.   Bactrim Ds [Sulfamethoxazole-Trimethoprim]     Hives    Ciprocinonide [Fluocinolone] Hives     made me feel awful    Codeine Other (See Comments)    DROPS PT. BLOOD PRESSURE   Penicillins     Has patient had a PCN reaction causing immediate rash, facial/tongue/throat swelling, SOB or lightheadedness with hypotension:unsure Has patient had a PCN reaction causing severe rash involving mucus membranes or skin necrosis:unsure Has patient had a PCN reaction that required hospitalization:No Has patient had a PCN reaction occurring within the last 10 years:No If all of the above answers are NO, then may proceed with Cephalosporin use. Childhood reaction    Sulfa Antibiotics Hives, Diarrhea, Nausea Only and Rash    Medications: Current Outpatient Medications  Medication Sig Dispense Refill   acetaminophen (TYLENOL) 500 MG tablet Take 500 mg by mouth every 6 (six) hours as needed (for pain).     amLODipine (NORVASC) 5 MG tablet Take 5 mg by mouth daily.     atenolol  (TENORMIN ) 25 MG tablet Take 1 tablet (25 mg total) by mouth daily. 90 tablet 3   fluticasone  (FLONASE ) 50 MCG/ACT nasal spray Place 1 spray into both nostrils daily. (Patient taking differently: Place 1 spray into both nostrils as needed.)     levocetirizine (XYZAL) 5 MG tablet Take 5 mg by mouth daily. In the morning per the patient.     omeprazole  (PRILOSEC) 20 MG capsule  TAKE ONE CAPSULE BY MOUTH ONCE DAILY 90 capsule 0   Rimegepant Sulfate (NURTEC) 75 MG TBDP Take 75 mg by mouth daily as needed. Dispense 10 or max allowed by insurance. Triptans contraindicated due to retinal migraines 10 tablet 6   rosuvastatin  (CRESTOR ) 10 MG tablet TAKE 1 TABLET BY MOUTH ONCE DAILY 90 tablet 2   No current facility-administered medications for this visit.    Review of Systems: GENERAL: negative for malaise, night sweats HEENT: No changes in hearing or vision, no nose bleeds or other nasal problems. NECK: Negative for lumps, goiter, pain  and significant neck swelling RESPIRATORY: Negative for cough, wheezing CARDIOVASCULAR: Negative for chest pain, leg swelling, palpitations, orthopnea GI: SEE HPI MUSCULOSKELETAL: Negative for joint pain or swelling, back pain, and muscle pain. SKIN: Negative for lesions, rash PSYCH: Negative for sleep disturbance, mood disorder and recent psychosocial stressors. HEMATOLOGY Negative for prolonged bleeding, bruising easily, and swollen nodes. ENDOCRINE: Negative for cold or heat intolerance, polyuria, polydipsia and goiter. NEURO: negative for tremor, gait imbalance, syncope and seizures. The remainder of the review of systems is noncontributory.   Physical Exam: BP 126/76 (BP Location: Left Arm, Patient Position: Sitting, Cuff Size: Normal)   Pulse 74   Temp 98.2 F (36.8 C) (Temporal)   Ht 5' 7 (1.702 m)   Wt 151 lb 6.4 oz (68.7 kg)   BMI 23.71 kg/m  GENERAL: The patient is AO x3, in no acute distress. HEENT: Head is normocephalic and atraumatic. EOMI are intact. Mouth is well hydrated and without lesions. NECK: Supple. No masses LUNGS: Clear to auscultation. No presence of rhonchi/wheezing/rales. Adequate chest expansion HEART: RRR, normal s1 and s2. ABDOMEN: Soft, nontender, no guarding, no peritoneal signs, and nondistended. BS +. No masses. EXTREMITIES: Without any cyanosis, clubbing, rash, lesions or edema. NEUROLOGIC:  AOx3, no focal motor deficit. SKIN: no jaundice, no rashes  Imaging/Labs: as above  I personally reviewed and interpreted the available labs, imaging and endoscopic files.  Impression and Plan: KEONA BILYEU is a 68 y.o. female with PMH IBS with diarrhea, GERD/globus sensation, who presents for follow up of IBS.  Patient had significant improvement of her diarrhea after Xifaxan  course but is still presenting some bloating issues and mild discomfort in her abdomen.  Not present any other red flag signs.  I encouraged her to implement the use of low FODMAP diet and peppermint as needed to relieve her symptoms.  Patient is in agreement to try this.  -Explained presumed etiology of IBS symptoms. Patient was counseled about the benefit of implementing a low FODMAP to improve symptoms and recurrent episodes. A dietary list was provided to the patient. Also, the patient was counseled about the benefit of avoiding stressing situations and potential environmental triggers leading to symptomatology. - Start IBGard 1 tablet every 8 hours as needed for bloating. Can use peppermint tea or edible peppermint as well.  All questions were answered.      Toribio Fortune, MD Gastroenterology and Hepatology [facility]

## 2024-06-08 DIAGNOSIS — N281 Cyst of kidney, acquired: Secondary | ICD-10-CM | POA: Diagnosis not present

## 2024-06-08 DIAGNOSIS — N2889 Other specified disorders of kidney and ureter: Secondary | ICD-10-CM | POA: Diagnosis not present

## 2024-06-15 ENCOUNTER — Encounter (INDEPENDENT_AMBULATORY_CARE_PROVIDER_SITE_OTHER): Payer: Self-pay | Admitting: Gastroenterology

## 2024-06-20 DIAGNOSIS — H43391 Other vitreous opacities, right eye: Secondary | ICD-10-CM | POA: Diagnosis not present

## 2024-06-28 DIAGNOSIS — N182 Chronic kidney disease, stage 2 (mild): Secondary | ICD-10-CM | POA: Diagnosis not present

## 2024-06-28 DIAGNOSIS — D220 Melanocytic nevi of lip: Secondary | ICD-10-CM | POA: Diagnosis not present

## 2024-06-28 DIAGNOSIS — I10 Essential (primary) hypertension: Secondary | ICD-10-CM | POA: Diagnosis not present

## 2024-06-28 DIAGNOSIS — L814 Other melanin hyperpigmentation: Secondary | ICD-10-CM | POA: Diagnosis not present

## 2024-06-29 ENCOUNTER — Encounter: Payer: Self-pay | Admitting: Adult Health

## 2024-06-29 ENCOUNTER — Ambulatory Visit: Admitting: Adult Health

## 2024-06-29 ENCOUNTER — Other Ambulatory Visit (HOSPITAL_COMMUNITY)
Admission: RE | Admit: 2024-06-29 | Discharge: 2024-06-29 | Disposition: A | Discharge status: Home / Self Care | Source: Ambulatory Visit | Attending: Adult Health | Admitting: Adult Health

## 2024-06-29 VITALS — BP 136/72 | HR 58 | Ht 67.0 in | Wt 148.0 lb

## 2024-06-29 DIAGNOSIS — Z01419 Encounter for gynecological examination (general) (routine) without abnormal findings: Secondary | ICD-10-CM

## 2024-06-29 DIAGNOSIS — I10 Essential (primary) hypertension: Secondary | ICD-10-CM | POA: Diagnosis not present

## 2024-06-29 DIAGNOSIS — Z1331 Encounter for screening for depression: Secondary | ICD-10-CM

## 2024-06-29 DIAGNOSIS — Z8742 Personal history of other diseases of the female genital tract: Secondary | ICD-10-CM | POA: Diagnosis not present

## 2024-06-29 DIAGNOSIS — Z1151 Encounter for screening for human papillomavirus (HPV): Secondary | ICD-10-CM | POA: Diagnosis not present

## 2024-06-29 DIAGNOSIS — Z780 Asymptomatic menopausal state: Secondary | ICD-10-CM | POA: Insufficient documentation

## 2024-06-29 NOTE — Progress Notes (Signed)
 [name], female   DOB: [birth year], 68 y.o.   MRN: [id number] History of Present Illness: April Knox is a 68 year old white female, widowed, PM in for a well woman gyn exam and pap. She had a leg cramp last, she says. She had renal US  at [facility] 06/08/24 no kidney stones, +bilateral simple cysts and ?scarring left kidney, F/U US  in 6 months for stability.  Last pap was 06/29/23 LSIL ,+HPV and colpo was 07/02/23 CIN 1.   PCP is Dr April Knox   Current Medications, Allergies, Past Medical History, Past Surgical History, Family History and Social History were reviewed in [employer] electronic medical record.     Review of Systems: Patient denies any headaches, hearing loss, fatigue, blurred vision, shortness of breath, chest pain, abdominal pain, problems with bowel movements, urination, or intercourse. No joint pain or mood swings.  Denies any  vaginal bleeding +leg cramp last night   Physical Exam:BP 136/72 (BP Location: Left Arm, Patient Position: Sitting, Cuff Size: Normal)   Pulse (!) 58   Ht 5' 7 (1.702 m)   Wt 148 lb (67.1 kg)   BMI 23.18 kg/m   General:  Well developed, well nourished, no acute distress Skin:  Warm and dry Neck:  Midline trachea, normal thyroid , good ROM, no lymphadenopathy.no carotid bruits heard Lungs; Clear to auscultation bilaterally Breast:  No dominant palpable mass, retraction, or nipple discharge, has bilateral implants Cardiovascular: Regular rate and rhythm Abdomen:  Soft, non tender, no hepatosplenomegaly Pelvic:  External genitalia is normal in appearance, no lesions.  The vagina is normal in appearance. Urethra has no lesions or masses. The cervix is smooth, pap with HR HPV genotyping performed.  Uterus is felt to be normal size, shape, and contour.  No adnexal masses or tenderness noted.Bladder is non tender, no masses felt. Rectal: Deferred  Extremities/musculoskeletal:  No swelling or varicosities noted, no clubbing or  cyanosis Psych:  No mood changes, alert and cooperative,seems happy AA is 0 Fall risk is low    06/29/2024    9:25 AM 06/29/2023    8:42 AM 06/27/2022    9:29 AM  Depression screen PHQ 2/9  Decreased Interest 1 0 0  Down, Depressed, Hopeless 2 0 0  PHQ - 2 Score 3 0 0  Altered sleeping 2 0 0  Tired, decreased energy 1 0 0  Change in appetite 1 0 0  Feeling bad or failure about yourself  2 0 0  Trouble concentrating 1 0 0  Moving slowly or fidgety/restless 1 0 0  Suicidal thoughts 1 0 0  PHQ-9 Score 12 0 0       06/29/2024    9:25 AM 06/29/2023    8:42 AM 06/27/2022    9:29 AM 06/24/2021   12:20 PM  GAD 7 : Generalized Anxiety Score  Nervous, Anxious, on Edge 1 0 0 1  Control/stop worrying 1 0 0 1  Worry too much - different things 1 0 0 1  Trouble relaxing 1 0 0 1  Restless 1 0 0 0  Easily annoyed or irritable 1 0 0 0  Afraid - awful might happen 1 0 0 0  Total GAD 7 Score 7 0 0 4  Anxiety Difficulty    Not difficult at all    Upstream - 06/29/24 0934       Pregnancy Intention Screening   Does the patient want to become pregnant in the next year? N/A    Does  the patient's partner want to become pregnant in the next year? N/A    Would the patient like to discuss contraceptive options today? N/A      Contraception Wrap Up   Current Method No Method - Other Reason   PM   End Method No Method - Other Reason   PM   Contraception Counseling Provided No         Examination chaperoned by Clarita Salt LPN   Impression and plan: 1. Encounter for gynecological examination with Papanicolaou smear of cervix (Primary) Pap sent Physical in 1 year Labs with PCP Colonoscopy 07/2023 repeat per GI Mammogram was negative 05/30/24 - Cytology - PAP( [place])  2. History of abnormal cervical Pap smear Pap sent  3. Hypertension, unspecified type Continue Norvasc 5 mg 1 daily and follow up with PCP

## 2024-06-30 ENCOUNTER — Telehealth: Payer: Self-pay | Admitting: Adult Health

## 2024-06-30 NOTE — Telephone Encounter (Signed)
 Pt had question about an eye drop that is supposed to help with drooping lied, I told her to talk with her eye doctor.

## 2024-06-30 NOTE — Telephone Encounter (Signed)
Pt is requesting a  call 

## 2024-07-01 LAB — CYTOLOGY - PAP
Comment: NEGATIVE
Comment: NEGATIVE
Comment: NEGATIVE
HPV 16: NEGATIVE
HPV 18 / 45: NEGATIVE
High risk HPV: POSITIVE — AB

## 2024-07-04 ENCOUNTER — Telehealth: Payer: Self-pay | Admitting: Adult Health

## 2024-07-04 ENCOUNTER — Ambulatory Visit: Payer: Self-pay | Admitting: Adult Health

## 2024-07-04 DIAGNOSIS — R87612 Low grade squamous intraepithelial lesion on cytologic smear of cervix (LGSIL): Secondary | ICD-10-CM

## 2024-07-04 MED ORDER — METRONIDAZOLE 500 MG PO TABS: 500.0000 mg | ORAL_TABLET | Freq: Two times a day (BID) | ORAL | 0 refills | Status: DC

## 2024-07-04 NOTE — Telephone Encounter (Signed)
 Called pt about pap results, will get colpo appt scheduled.

## 2024-07-05 ENCOUNTER — Other Ambulatory Visit (HOSPITAL_COMMUNITY)
Admission: RE | Admit: 2024-07-05 | Discharge: 2024-07-05 | Disposition: A | Discharge status: Home / Self Care | Source: Ambulatory Visit | Attending: Women's Health | Admitting: Women's Health

## 2024-07-05 ENCOUNTER — Other Ambulatory Visit: Payer: Self-pay | Admitting: Adult Health

## 2024-07-05 ENCOUNTER — Encounter: Payer: Self-pay | Admitting: Women's Health

## 2024-07-05 ENCOUNTER — Ambulatory Visit (INDEPENDENT_AMBULATORY_CARE_PROVIDER_SITE_OTHER): Admitting: Women's Health

## 2024-07-05 VITALS — BP 162/68 | HR 82 | Ht 67.0 in | Wt 148.0 lb

## 2024-07-05 DIAGNOSIS — R87612 Low grade squamous intraepithelial lesion on cytologic smear of cervix (LGSIL): Secondary | ICD-10-CM | POA: Insufficient documentation

## 2024-07-05 DIAGNOSIS — N871 Moderate cervical dysplasia: Secondary | ICD-10-CM | POA: Diagnosis not present

## 2024-07-05 MED ORDER — METRONIDAZOLE 0.75 % VA GEL: 1.0000 | Freq: Every day | VAGINAL | 0 refills | Status: AC

## 2024-07-05 NOTE — Progress Notes (Signed)
   COLPOSCOPY PROCEDURE NOTE Patient name: April Knox MRN [id number]  Date of birth: [birth year] Subjective Findings:   April Knox is a 68 y.o. G3P3 Caucasian female being seen today for a colposcopy. Indication: Abnormal pap on 06/29/24: LSIL w/ HRHPV positive: other (not 16, 18/45)  Flagyl  for BV on pap causing heart to beat faster, wants gel Prior cytology:  Date Result Procedure  06/29/23 LSIL w/ HRHPV positive: 16 Colposcopy: CIN-1  06/27/22 LSIL w/ HRHPV positive: 16 Colposcopy: nl, no bx  06/24/21 LSIL w/ HRHPV positive: 16 Colposcopy: nl, no bx  2021 LSIL w/ HRHPV positive: other (not 16, 18/45) None  No LMP recorded. Patient is postmenopausal. Contraception: post menopausal status. Menopausal: yes. Hysterectomy: no.   Considering pregnancy: No New sex partner: no Smoker: no. Immunocompromised: no.   The risks and benefits were explained and informed consent was obtained, and written copy is in chart. Pertinent History Reviewed:   Reviewed past medical,surgical, social, obstetrical and family history.  Reviewed problem list, medications and allergies. Objective Findings & Procedure:   Vitals:   07/05/24 1334  BP: (!) 166/71  Pulse: 76  Weight: 148 lb (67.1 kg)  Height: 5' 7 (1.702 m)  Body mass index is 23.18 kg/m.  No results found for this or any previous visit (from the past 24 hours).   Time out was performed.  Speculum placed in the vagina, cervix fully visualized. SCJ: not fully visualized. Cervix swabbed x 3 with acetic acid.  Acetowhitening present: No Cervix: no visible lesions, no mosaicism, no punctation, and no abnormal vasculature. Endocervical curettage performed, Cervical biopsies taken at 6 o'clock, and Hemostasis achieved with Monsel's solution. Vagina: vaginal colposcopy not performed Vulva: vulvar colposcopy not performed  Specimens: 2  Complications: none  Chaperone: [name]  Colposcopic Impression & Plan:   Normal colposcopy  without lesions Plan: Post biopsy instructions given, Will notify patient of results when back, and Will base plan of care on pathology results and ASCCP guidelines  BV on pap> po flagyl  causing heart to beat faster, requests gel, has used in past and did fine w/ it  Return in about 1 year (around 07/05/2025) for Pap & physical.  Suzen JONELLE Fetters CNM, WHNP-BC 07/05/2024 2:03 PM

## 2024-07-05 NOTE — Patient Instructions (Signed)
 Colposcopy, Care After  The following information offers guidance on how to care for yourself after your procedure. Your health care provider may also give you more specific instructions. If you have problems or questions, contact your health care provider. What can I expect after the procedure? If you had a colposcopy without a biopsy, you can expect to feel fine right away after your procedure. However, you may have some spotting of blood for a few days. You can return to your normal activities. If you had a colposcopy with a biopsy, it is common after the procedure to have: Soreness and mild pain. These may last for a few days. Mild vaginal bleeding or discharge that is dark-colored and grainy. This may last for a few days. The discharge may be caused by a liquid (solution) that was used during the procedure. You may need to wear a sanitary pad during this time. Spotting of blood for at least 48 hours after the procedure. Follow these instructions at home: Medicines Take over-the-counter and prescription medicines only as told by your health care provider. Talk with your health care provider about what type of over-the-counter pain medicines and prescription medicines you can start to take again. It is especially important to talk with your health care provider if you take blood thinners. Activity Avoid using douche products, using tampons, and having sex for at least 3 days after the procedure or for as long as told by your health care provider. Return to your normal activities as told by your health care provider. Ask your health care provider what activities are safe for you. General instructions Ask your health care provider if you may take baths, swim, or use a hot tub. You may take showers. If you use birth control (contraception), continue to use it. Keep all follow-up visits. This is important. Contact a health care provider if: You have a fever or chills. You faint or feel  light-headed. Get help right away if: You have heavy bleeding from your vagina or pass blood clots. Heavy bleeding is bleeding that soaks through a sanitary pad in less than 1 hour. You have vaginal discharge that is abnormal, is yellow in color, or smells bad. This could be a sign of infection. You have severe pain or cramps in your lower abdomen that do not go away with medicine. Summary If you had a colposcopy without a biopsy, you can expect to feel fine right away, but you may have some spotting of blood for a few days. You can return to your normal activities. If you had a colposcopy with a biopsy, it is common to have mild pain for a few days and spotting for 48 hours after the procedure. Avoid using douche products, using tampons, and having sex for at least 3 days after the procedure or for as long as told by your health care provider. Get help right away if you have heavy bleeding, severe pain, or signs of infection. This information is not intended to replace advice given to you by your health care provider. Make sure you discuss any questions you have with your health care provider. Document Revised: 01/13/2021 Document Reviewed: 01/13/2021 Elsevier Patient Education  2024 [employer].

## 2024-07-05 NOTE — Addendum Note (Signed)
 Addended by: SANNA GONG A on: 07/05/2024 03:07 PM   Modules accepted: Orders

## 2024-07-06 ENCOUNTER — Telehealth: Payer: Self-pay | Admitting: Adult Health

## 2024-07-06 NOTE — Telephone Encounter (Signed)
 Left message I called

## 2024-07-06 NOTE — Telephone Encounter (Signed)
 Patient called this morning wanting to know is it normal for her to be sore after colpo didn't wont the message going to [place] wants to speak to [place] asked if she will call her back I informed her I could send a message.

## 2024-07-07 ENCOUNTER — Ambulatory Visit: Payer: Self-pay | Admitting: Women's Health

## 2024-07-07 ENCOUNTER — Telehealth: Payer: Self-pay | Admitting: Adult Health

## 2024-07-07 DIAGNOSIS — R87618 Other abnormal cytological findings on specimens from cervix uteri: Secondary | ICD-10-CM

## 2024-07-07 LAB — SURGICAL PATHOLOGY

## 2024-07-07 NOTE — Telephone Encounter (Signed)
 Estill Bamberg spoke with pt.

## 2024-07-07 NOTE — Telephone Encounter (Signed)
 Pt states she missed the call from [place] and would like a call back.

## 2024-07-08 ENCOUNTER — Telehealth: Payer: Self-pay

## 2024-07-08 NOTE — Telephone Encounter (Signed)
 Patient called about prescription and if she still needs to take it. Rn looked at her meds and advised to take if she was having symptoms. Rn explained that she has BV on her Pap smear. Patient did state two days before she did a  salt and vinegar douche. RN confirmed that that could be the cause of her results. Patient not comfortable taking since she does not have any symptoms. RN encourage autonomy and if she feels like symptoms develop she can take the medication.

## 2024-07-18 ENCOUNTER — Ambulatory Visit (INDEPENDENT_AMBULATORY_CARE_PROVIDER_SITE_OTHER): Admitting: Gastroenterology

## 2024-07-18 ENCOUNTER — Other Ambulatory Visit: Payer: Self-pay | Admitting: Cardiology

## 2024-07-26 ENCOUNTER — Ambulatory Visit (INDEPENDENT_AMBULATORY_CARE_PROVIDER_SITE_OTHER): Admitting: Plastic Surgery

## 2024-07-26 ENCOUNTER — Encounter: Payer: Self-pay | Admitting: Plastic Surgery

## 2024-07-26 VITALS — BP 128/80 | HR 70

## 2024-07-26 DIAGNOSIS — R52 Pain, unspecified: Secondary | ICD-10-CM | POA: Diagnosis not present

## 2024-07-26 DIAGNOSIS — Z09 Encounter for follow-up examination after completed treatment for conditions other than malignant neoplasm: Secondary | ICD-10-CM

## 2024-07-26 DIAGNOSIS — T8543XA Leakage of breast prosthesis and implant, initial encounter: Secondary | ICD-10-CM

## 2024-07-26 DIAGNOSIS — M25551 Pain in right hip: Secondary | ICD-10-CM | POA: Diagnosis not present

## 2024-07-26 NOTE — Progress Notes (Signed)
 [name], female    DOB: [birth year], 68 y.o.   MRN: [id number]   Chief Complaint  Patient presents with   Breast Problem    The patient is a 68 year old female here for a 1 year follow-up on her breast.  The patient had had implants placed previously and there was some issues with rupture.  She was seen in September of last year.  She looks great and is doing very well.  No sign of infection.  No capsular contracture.  Her location of the implants is very symmetric.  She is very pleased with her results.  She does need to make sure she continues with yearly mammograms.    Review of Systems  Constitutional: Negative.   Eyes: Negative.   Respiratory: Negative.    Cardiovascular: Negative.   Gastrointestinal: Negative.   Endocrine: Negative.   Genitourinary: Negative.   Musculoskeletal: Negative.     Past Medical History:  Diagnosis Date   Abnormal Pap smear    Abnormal Papanicolaou smear of cervix with positive human papilloma virus (HPV) test 04/06/2018   Pap was LSIL with +HPV, will need colpo____   Anxiety    Back pain 03/07/2014   Cervical spine disease    Mild   Chest pain    Elevated cholesterol    Ganglion cyst of left foot 12/06/2013   GERD (gastroesophageal reflux disease)    H/O bilateral breast implants 03/03/2013   Hematuria 03/28/2014   Hot flashes 12/06/2013   Hypertension    Menopausal symptoms 12/06/2013   Mental disorder    anxiety   Migraine with visual aura    Osteopenia after menopause 10/06/2018   DEXA 09/28/2018, osteopenia, take calcium  vitamin D and stay active    PSVT (paroxysmal supraventricular tachycardia)    Documented by event recorder   Rectocele 03/03/2013   RUQ pain 03/07/2014   Vaginal discharge 02/24/2014   Vaginal dryness, menopausal 12/06/2013   Vaginal Pap smear, abnormal    Yeast infection 02/24/2014    Past Surgical History:  Procedure Laterality Date   BIOPSY N/A 12/16/2019   Procedure: BIOPSY;   Surgeon: Golda Claudis PENNER, MD;  Location: [facility];  Service: Endoscopy;  Laterality: N/A;   BREAST ENHANCEMENT SURGERY     COLONOSCOPY N/A 06/25/2016   Procedure: COLONOSCOPY;  Surgeon: Claudis PENNER Golda, MD;  Location: [facility];  Service: Endoscopy;  Laterality: N/A;  1:00-moved to 1230 Ann to notify pt   COLONOSCOPY WITH PROPOFOL  N/A 07/21/2023   Procedure: COLONOSCOPY WITH PROPOFOL ;  Surgeon: Eartha Angelia Sieving, MD;  Location: [facility];  Service: Gastroenterology;  Laterality: N/A;  7:30AM;ASA 1   ESOPHAGOGASTRODUODENOSCOPY (EGD) WITH PROPOFOL  N/A 12/16/2019   Procedure: ESOPHAGOGASTRODUODENOSCOPY (EGD) WITH PROPOFOL ;  Surgeon: Golda Claudis PENNER, MD;  Location: [facility];  Service: Endoscopy;  Laterality: N/A;  830   POLYPECTOMY  06/25/2016   Procedure: POLYPECTOMY;  Surgeon: Claudis PENNER Golda, MD;  Location: [facility];  Service: Endoscopy;;  colon    toe biopsy        Current Outpatient Medications:    acetaminophen (TYLENOL) 500 MG tablet, Take 500 mg by mouth every 6 (six) hours as needed (for pain)., Disp: , Rfl:    amLODipine (NORVASC) 5 MG tablet, Take 5 mg by mouth daily., Disp: , Rfl:    atenolol  (TENORMIN ) 25 MG tablet, TAKE 1 TABLET BY MOUTH DAILY, Disp: 90 tablet, Rfl: 0   fluticasone  (FLONASE ) 50 MCG/ACT nasal spray,  Place 1 spray into both nostrils daily., Disp: , Rfl:    levocetirizine (XYZAL) 5 MG tablet, Take 5 mg by mouth daily. In the morning per the patient., Disp: , Rfl:    metroNIDAZOLE  (METROGEL ) 0.75 % vaginal gel, Place 1 Applicatorful vaginally at bedtime. X 5 nights, no sex or alcohol  while using, Disp: 70 g, Rfl: 0   omeprazole  (PRILOSEC) 20 MG capsule, TAKE ONE CAPSULE BY MOUTH ONCE DAILY, Disp: 90 capsule, Rfl: 0   Rimegepant Sulfate (NURTEC) 75 MG TBDP, Take 75 mg by mouth daily as needed. Dispense 10 or max allowed by insurance. Triptans contraindicated due to retinal migraines, Disp: 10 tablet, Rfl: 6   rosuvastatin  (CRESTOR ) 10 MG  tablet, TAKE 1 TABLET BY MOUTH ONCE DAILY, Disp: 90 tablet, Rfl: 2   Objective:   Vitals:   07/26/24 1254  BP: 128/80  Pulse: 70  SpO2: 99%    Physical Exam Vitals reviewed.  Constitutional:      Appearance: Normal appearance.  HENT:     Head: Atraumatic.  Cardiovascular:     Rate and Rhythm: Normal rate.     Pulses: Normal pulses.  Pulmonary:     Effort: Pulmonary effort is normal.  Abdominal:     Palpations: Abdomen is soft.  Skin:    General: Skin is warm.     Capillary Refill: Capillary refill takes less than 2 seconds.  Neurological:     Mental Status: She is alert and oriented to person, place, and time.  Psychiatric:        Mood and Affect: Mood normal.        Behavior: Behavior normal.        Thought Content: Thought content normal.        Judgment: Judgment normal.     Assessment & Plan:  Rupture of implant of right breast, initial encounter  Rupture of implant of left breast, initial encounter  Yearly mammograms and self exams.  No areas of concern and we are happy to see her back in 1 year. Pictures were obtained of the patient and placed in the chart with the patient's or guardian's permission.   Estefana RAMAN Athel Merriweather, DO

## 2024-07-29 DIAGNOSIS — I10 Essential (primary) hypertension: Secondary | ICD-10-CM | POA: Diagnosis not present

## 2024-07-29 DIAGNOSIS — N182 Chronic kidney disease, stage 2 (mild): Secondary | ICD-10-CM | POA: Diagnosis not present

## 2024-08-16 ENCOUNTER — Ambulatory Visit: Payer: Medicare HMO | Admitting: Plastic Surgery

## 2024-08-29 ENCOUNTER — Other Ambulatory Visit: Payer: Self-pay | Admitting: Cardiology

## 2025-07-25 ENCOUNTER — Ambulatory Visit: Admitting: Plastic Surgery
